# Patient Record
Sex: Male | Born: 1970
Health system: Southern US, Community
[De-identification: ages and names within clinical notes are randomized; demographics above are authoritative.]

## PROBLEM LIST (undated history)

## (undated) DIAGNOSIS — C210 Malignant neoplasm of anus, unspecified: Secondary | ICD-10-CM

## (undated) DIAGNOSIS — R911 Solitary pulmonary nodule: Secondary | ICD-10-CM

## (undated) DIAGNOSIS — K6289 Other specified diseases of anus and rectum: Secondary | ICD-10-CM

## (undated) DIAGNOSIS — Z9221 Personal history of antineoplastic chemotherapy: Secondary | ICD-10-CM

## (undated) DIAGNOSIS — B20 Human immunodeficiency virus [HIV] disease: Secondary | ICD-10-CM

## (undated) DIAGNOSIS — Z923 Personal history of irradiation: Secondary | ICD-10-CM

## (undated) DIAGNOSIS — A630 Anogenital (venereal) warts: Secondary | ICD-10-CM

## (undated) DIAGNOSIS — R768 Other specified abnormal immunological findings in serum: Secondary | ICD-10-CM

## (undated) HISTORY — DX: Human immunodeficiency virus (HIV) disease: B20

## (undated) HISTORY — PX: OTHER SURGICAL HISTORY: SHX169

## (undated) NOTE — *Deleted (*Deleted)
[facility] ([facility] Care Management  10/11/2019  Juan Harrison 12/19/1970 [id number]   Telephone Assessment-Successful-Preventive Measures  RN spoke with pt today and received an update ongoing his ongoing management of care. Pt reports he is doing well with no acute issues at this time. Pt verified sufficient refills on his medications and transportation to get to all scheduled appointments. Pt continue to report managing his care to avoid hospitalization and acute problems from occurring.  Will review and discuss pt's progress with the current plan of care in place and continue to encouraged adherence with prevention measures. Will continue to offer tools to assist with these goals and update the goals of care.   Plan: Will follow up next month with plans to re-evaluate pt's progress next month.     Lynnett Langlinais, RN Care Management Coordinator [facility] Main Office [phone]  

---

## 2014-12-07 DIAGNOSIS — R51 Headache: Secondary | ICD-10-CM | POA: Diagnosis not present

## 2014-12-07 DIAGNOSIS — Z720 Tobacco use: Secondary | ICD-10-CM | POA: Diagnosis not present

## 2014-12-07 DIAGNOSIS — B20 Human immunodeficiency virus [HIV] disease: Secondary | ICD-10-CM | POA: Diagnosis not present

## 2014-12-07 DIAGNOSIS — B451 Cerebral cryptococcosis: Secondary | ICD-10-CM | POA: Diagnosis not present

## 2014-12-07 DIAGNOSIS — G02 Meningitis in other infectious and parasitic diseases classified elsewhere: Secondary | ICD-10-CM | POA: Diagnosis not present

## 2015-02-15 DIAGNOSIS — B451 Cerebral cryptococcosis: Secondary | ICD-10-CM | POA: Diagnosis not present

## 2015-02-15 DIAGNOSIS — B20 Human immunodeficiency virus [HIV] disease: Secondary | ICD-10-CM | POA: Diagnosis not present

## 2015-02-15 DIAGNOSIS — Z720 Tobacco use: Secondary | ICD-10-CM | POA: Diagnosis not present

## 2015-02-15 DIAGNOSIS — Z21 Asymptomatic human immunodeficiency virus [HIV] infection status: Secondary | ICD-10-CM | POA: Diagnosis not present

## 2015-03-24 DIAGNOSIS — Z79899 Other long term (current) drug therapy: Secondary | ICD-10-CM | POA: Diagnosis not present

## 2015-03-24 DIAGNOSIS — R21 Rash and other nonspecific skin eruption: Secondary | ICD-10-CM | POA: Diagnosis not present

## 2015-03-24 DIAGNOSIS — F17210 Nicotine dependence, cigarettes, uncomplicated: Secondary | ICD-10-CM | POA: Diagnosis not present

## 2015-03-24 DIAGNOSIS — Z720 Tobacco use: Secondary | ICD-10-CM | POA: Diagnosis not present

## 2015-03-24 DIAGNOSIS — B451 Cerebral cryptococcosis: Secondary | ICD-10-CM | POA: Diagnosis not present

## 2015-03-24 DIAGNOSIS — Z9119 Patient's noncompliance with other medical treatment and regimen: Secondary | ICD-10-CM | POA: Diagnosis not present

## 2015-03-24 DIAGNOSIS — Z21 Asymptomatic human immunodeficiency virus [HIV] infection status: Secondary | ICD-10-CM | POA: Diagnosis not present

## 2015-03-24 DIAGNOSIS — B20 Human immunodeficiency virus [HIV] disease: Secondary | ICD-10-CM | POA: Diagnosis not present

## 2015-03-24 DIAGNOSIS — R768 Other specified abnormal immunological findings in serum: Secondary | ICD-10-CM | POA: Diagnosis not present

## 2015-04-26 DIAGNOSIS — B181 Chronic viral hepatitis B without delta-agent: Secondary | ICD-10-CM | POA: Diagnosis not present

## 2015-05-15 DIAGNOSIS — Z720 Tobacco use: Secondary | ICD-10-CM | POA: Diagnosis not present

## 2015-05-15 DIAGNOSIS — B20 Human immunodeficiency virus [HIV] disease: Secondary | ICD-10-CM | POA: Diagnosis not present

## 2015-05-15 DIAGNOSIS — R51 Headache: Secondary | ICD-10-CM | POA: Diagnosis not present

## 2015-05-15 DIAGNOSIS — B451 Cerebral cryptococcosis: Secondary | ICD-10-CM | POA: Diagnosis not present

## 2015-05-15 DIAGNOSIS — Z9114 Patient's other noncompliance with medication regimen: Secondary | ICD-10-CM | POA: Diagnosis not present

## 2015-05-19 DIAGNOSIS — R030 Elevated blood-pressure reading, without diagnosis of hypertension: Secondary | ICD-10-CM | POA: Diagnosis not present

## 2015-05-19 DIAGNOSIS — R0602 Shortness of breath: Secondary | ICD-10-CM | POA: Diagnosis not present

## 2015-05-19 DIAGNOSIS — Z21 Asymptomatic human immunodeficiency virus [HIV] infection status: Secondary | ICD-10-CM | POA: Diagnosis not present

## 2015-05-19 DIAGNOSIS — D72819 Decreased white blood cell count, unspecified: Secondary | ICD-10-CM | POA: Diagnosis not present

## 2015-05-19 DIAGNOSIS — B181 Chronic viral hepatitis B without delta-agent: Secondary | ICD-10-CM | POA: Diagnosis not present

## 2015-05-19 DIAGNOSIS — R918 Other nonspecific abnormal finding of lung field: Secondary | ICD-10-CM | POA: Diagnosis not present

## 2015-05-19 DIAGNOSIS — N179 Acute kidney failure, unspecified: Secondary | ICD-10-CM | POA: Diagnosis not present

## 2015-05-19 DIAGNOSIS — B20 Human immunodeficiency virus [HIV] disease: Secondary | ICD-10-CM | POA: Diagnosis not present

## 2015-05-19 DIAGNOSIS — E875 Hyperkalemia: Secondary | ICD-10-CM | POA: Diagnosis not present

## 2015-05-19 DIAGNOSIS — T367X5A Adverse effect of antifungal antibiotics, systemically used, initial encounter: Secondary | ICD-10-CM | POA: Diagnosis not present

## 2015-05-19 DIAGNOSIS — B451 Cerebral cryptococcosis: Secondary | ICD-10-CM | POA: Diagnosis not present

## 2015-05-19 DIAGNOSIS — Z452 Encounter for adjustment and management of vascular access device: Secondary | ICD-10-CM | POA: Diagnosis not present

## 2015-05-19 DIAGNOSIS — R51 Headache: Secondary | ICD-10-CM | POA: Diagnosis not present

## 2015-05-19 DIAGNOSIS — B1910 Unspecified viral hepatitis B without hepatic coma: Secondary | ICD-10-CM | POA: Diagnosis not present

## 2015-05-19 DIAGNOSIS — E876 Hypokalemia: Secondary | ICD-10-CM | POA: Diagnosis not present

## 2015-05-19 DIAGNOSIS — R109 Unspecified abdominal pain: Secondary | ICD-10-CM | POA: Diagnosis not present

## 2015-05-19 DIAGNOSIS — Z9114 Patient's other noncompliance with medication regimen: Secondary | ICD-10-CM | POA: Diagnosis not present

## 2015-05-19 DIAGNOSIS — R300 Dysuria: Secondary | ICD-10-CM | POA: Diagnosis not present

## 2015-05-19 DIAGNOSIS — F17210 Nicotine dependence, cigarettes, uncomplicated: Secondary | ICD-10-CM | POA: Diagnosis not present

## 2015-06-05 DIAGNOSIS — F17210 Nicotine dependence, cigarettes, uncomplicated: Secondary | ICD-10-CM | POA: Diagnosis not present

## 2015-06-05 DIAGNOSIS — Z792 Long term (current) use of antibiotics: Secondary | ICD-10-CM | POA: Diagnosis not present

## 2015-06-05 DIAGNOSIS — B1910 Unspecified viral hepatitis B without hepatic coma: Secondary | ICD-10-CM | POA: Diagnosis not present

## 2015-06-05 DIAGNOSIS — B20 Human immunodeficiency virus [HIV] disease: Secondary | ICD-10-CM | POA: Diagnosis not present

## 2015-06-05 DIAGNOSIS — Z9119 Patient's noncompliance with other medical treatment and regimen: Secondary | ICD-10-CM | POA: Diagnosis not present

## 2015-06-05 DIAGNOSIS — G510 Bell's palsy: Secondary | ICD-10-CM | POA: Diagnosis not present

## 2015-06-05 DIAGNOSIS — B451 Cerebral cryptococcosis: Secondary | ICD-10-CM | POA: Diagnosis not present

## 2015-06-06 DIAGNOSIS — Z9119 Patient's noncompliance with other medical treatment and regimen: Secondary | ICD-10-CM | POA: Diagnosis not present

## 2015-06-06 DIAGNOSIS — F17201 Nicotine dependence, unspecified, in remission: Secondary | ICD-10-CM | POA: Diagnosis not present

## 2015-06-06 DIAGNOSIS — B451 Cerebral cryptococcosis: Secondary | ICD-10-CM | POA: Diagnosis not present

## 2015-06-06 DIAGNOSIS — Z21 Asymptomatic human immunodeficiency virus [HIV] infection status: Secondary | ICD-10-CM | POA: Diagnosis not present

## 2015-06-06 DIAGNOSIS — F810 Specific reading disorder: Secondary | ICD-10-CM | POA: Diagnosis not present

## 2015-06-06 DIAGNOSIS — Z87891 Personal history of nicotine dependence: Secondary | ICD-10-CM | POA: Diagnosis not present

## 2015-06-06 DIAGNOSIS — B20 Human immunodeficiency virus [HIV] disease: Secondary | ICD-10-CM | POA: Diagnosis not present

## 2015-06-26 DIAGNOSIS — B1910 Unspecified viral hepatitis B without hepatic coma: Secondary | ICD-10-CM | POA: Diagnosis not present

## 2015-06-26 DIAGNOSIS — B20 Human immunodeficiency virus [HIV] disease: Secondary | ICD-10-CM | POA: Diagnosis not present

## 2015-06-26 DIAGNOSIS — Z9119 Patient's noncompliance with other medical treatment and regimen: Secondary | ICD-10-CM | POA: Diagnosis not present

## 2015-06-26 DIAGNOSIS — F17210 Nicotine dependence, cigarettes, uncomplicated: Secondary | ICD-10-CM | POA: Diagnosis not present

## 2015-06-26 DIAGNOSIS — Z21 Asymptomatic human immunodeficiency virus [HIV] infection status: Secondary | ICD-10-CM | POA: Diagnosis not present

## 2015-06-26 DIAGNOSIS — B451 Cerebral cryptococcosis: Secondary | ICD-10-CM | POA: Diagnosis not present

## 2015-06-26 DIAGNOSIS — R768 Other specified abnormal immunological findings in serum: Secondary | ICD-10-CM | POA: Diagnosis not present

## 2015-09-14 DIAGNOSIS — Z113 Encounter for screening for infections with a predominantly sexual mode of transmission: Secondary | ICD-10-CM | POA: Diagnosis not present

## 2015-09-14 DIAGNOSIS — B451 Cerebral cryptococcosis: Secondary | ICD-10-CM | POA: Diagnosis not present

## 2015-09-14 DIAGNOSIS — Z118 Encounter for screening for other infectious and parasitic diseases: Secondary | ICD-10-CM | POA: Diagnosis not present

## 2015-09-14 DIAGNOSIS — B20 Human immunodeficiency virus [HIV] disease: Secondary | ICD-10-CM | POA: Diagnosis not present

## 2015-09-14 DIAGNOSIS — Z87891 Personal history of nicotine dependence: Secondary | ICD-10-CM | POA: Diagnosis not present

## 2016-01-08 DIAGNOSIS — Z0000 Encounter for general adult medical examination without abnormal findings: Secondary | ICD-10-CM | POA: Diagnosis not present

## 2016-01-08 DIAGNOSIS — F17210 Nicotine dependence, cigarettes, uncomplicated: Secondary | ICD-10-CM | POA: Diagnosis not present

## 2016-01-08 DIAGNOSIS — Z9114 Patient's other noncompliance with medication regimen: Secondary | ICD-10-CM | POA: Diagnosis not present

## 2016-01-08 DIAGNOSIS — B20 Human immunodeficiency virus [HIV] disease: Secondary | ICD-10-CM | POA: Diagnosis not present

## 2016-01-08 DIAGNOSIS — Z23 Encounter for immunization: Secondary | ICD-10-CM | POA: Diagnosis not present

## 2016-02-29 ENCOUNTER — Other Ambulatory Visit (HOSPITAL_COMMUNITY)
Admission: RE | Admit: 2016-02-29 | Discharge: 2016-02-29 | Disposition: A | Discharge status: Home / Self Care | Payer: Medicare Other | Source: Ambulatory Visit | Attending: Internal Medicine | Admitting: Internal Medicine

## 2016-02-29 ENCOUNTER — Other Ambulatory Visit: Payer: Medicare Other

## 2016-02-29 DIAGNOSIS — B20 Human immunodeficiency virus [HIV] disease: Secondary | ICD-10-CM

## 2016-02-29 DIAGNOSIS — Z113 Encounter for screening for infections with a predominantly sexual mode of transmission: Secondary | ICD-10-CM | POA: Diagnosis not present

## 2016-02-29 DIAGNOSIS — Z79899 Other long term (current) drug therapy: Secondary | ICD-10-CM | POA: Diagnosis not present

## 2016-02-29 DIAGNOSIS — Z111 Encounter for screening for respiratory tuberculosis: Secondary | ICD-10-CM

## 2016-02-29 LAB — COMPLETE METABOLIC PANEL WITH GFR
ALT: 22 U/L (ref 9–46)
AST: 18 U/L (ref 10–40)
Albumin: 4 g/dL (ref 3.6–5.1)
Alkaline Phosphatase: 66 U/L (ref 40–115)
BUN: 12 mg/dL (ref 7–25)
CALCIUM: 9.2 mg/dL (ref 8.6–10.3)
CHLORIDE: 103 mmol/L (ref 98–110)
CO2: 28 mmol/L (ref 20–31)
CREATININE: 1.11 mg/dL (ref 0.60–1.35)
GFR, EST NON AFRICAN AMERICAN: 81 mL/min (ref >=60)
Glucose, Bld: 88 mg/dL (ref 65–99)
POTASSIUM: 4.1 mmol/L (ref 3.5–5.3)
Sodium: 139 mmol/L (ref 135–146)
Total Bilirubin: 0.4 mg/dL (ref 0.2–1.2)
Total Protein: 7.3 g/dL (ref 6.1–8.1)

## 2016-02-29 LAB — LIPID PANEL
Cholesterol: 117 mg/dL — ABNORMAL LOW (ref 125–200)
HDL: 39 mg/dL — AB (ref >=40)
LDL CALC: 64 mg/dL (ref <130)
TRIGLYCERIDES: 69 mg/dL (ref <150)
Total CHOL/HDL Ratio: 3 Ratio (ref <=5.0)
VLDL: 14 mg/dL (ref <30)

## 2016-02-29 LAB — CBC WITH DIFFERENTIAL/PLATELET
Basophils Absolute: 0.1 10*3/uL (ref 0.0–0.1)
Basophils Relative: 3 % — ABNORMAL HIGH (ref 0–1)
EOS ABS: 0.3 10*3/uL (ref 0.0–0.7)
EOS PCT: 11 % — AB (ref 0–5)
HEMATOCRIT: 42 % (ref 39.0–52.0)
Hemoglobin: 13.7 g/dL (ref 13.0–17.0)
LYMPHS ABS: 1 10*3/uL (ref 0.7–4.0)
LYMPHS PCT: 35 % (ref 12–46)
MCH: 27.7 pg (ref 26.0–34.0)
MCHC: 32.6 g/dL (ref 30.0–36.0)
MCV: 85 fL (ref 78.0–100.0)
MONO ABS: 0.3 10*3/uL (ref 0.1–1.0)
MPV: 11.1 fL (ref 8.6–12.4)
Monocytes Relative: 9 % (ref 3–12)
Neutro Abs: 1.2 10*3/uL — ABNORMAL LOW (ref 1.7–7.7)
Neutrophils Relative %: 42 % — ABNORMAL LOW (ref 43–77)
PLATELETS: 183 10*3/uL (ref 150–400)
RBC: 4.94 MIL/uL (ref 4.22–5.81)
RDW: 14.3 % (ref 11.5–15.5)
WBC: 2.8 10*3/uL — AB (ref 4.0–10.5)

## 2016-03-01 LAB — URINALYSIS
Bilirubin Urine: NEGATIVE
Glucose, UA: NEGATIVE
HGB URINE DIPSTICK: NEGATIVE
KETONES UR: NEGATIVE
Leukocytes, UA: NEGATIVE
NITRITE: NEGATIVE
Specific Gravity, Urine: 1.028 (ref 1.001–1.035)
pH: 6.5 (ref 5.0–8.0)

## 2016-03-01 LAB — RPR TITER: RPR Titer: 1:1 {titer}

## 2016-03-01 LAB — URINE CYTOLOGY ANCILLARY ONLY
Chlamydia: NEGATIVE
Neisseria Gonorrhea: NEGATIVE

## 2016-03-01 LAB — HEPATITIS B SURFACE ANTIGEN: HEP B S AG: POSITIVE — AB

## 2016-03-01 LAB — RPR: RPR: REACTIVE — AB

## 2016-03-01 LAB — HIV-1 RNA ULTRAQUANT REFLEX TO GENTYP+
HIV 1 RNA QUANT: 32353 {copies}/mL — AB (ref <20)
HIV-1 RNA QUANT, LOG: 4.51 {Log_copies}/mL — AB (ref <1.30)

## 2016-03-01 LAB — T-HELPER CELL (CD4) - (RCID CLINIC ONLY)
CD4 % Helper T Cell: 4 % — ABNORMAL LOW (ref 33–55)
CD4 T CELL ABS: 30 /uL — AB (ref 400–2700)

## 2016-03-01 LAB — HEPATITIS B SURFACE ANTIBODY,QUALITATIVE: HEP B S AB: NEGATIVE

## 2016-03-01 LAB — HEPATITIS C ANTIBODY: HCV AB: NEGATIVE

## 2016-03-01 LAB — HEPATITIS B SURF AG CONFIRMATION: HEPATITIS B SURFACE ANTIGEN CONFIRMATION: POSITIVE — AB

## 2016-03-01 LAB — FLUORESCENT TREPONEMAL AB(FTA)-IGG-BLD: Fluorescent Treponemal ABS: NONREACTIVE

## 2016-03-01 LAB — HEPATITIS A ANTIBODY, TOTAL: Hep A Total Ab: NONREACTIVE

## 2016-03-01 LAB — HEPATITIS B CORE ANTIBODY, TOTAL: HEP B C TOTAL AB: NONREACTIVE

## 2016-03-02 LAB — QUANTIFERON TB GOLD ASSAY (BLOOD)
INTERFERON GAMMA RELEASE ASSAY: NEGATIVE
Mitogen-Nil: 5.67 IU/mL
Quantiferon Nil Value: 0.04 IU/mL
Quantiferon Tb Ag Minus Nil Value: 0.03 IU/mL

## 2016-03-06 LAB — HLA B*5701: HLA-B 5701 W/RFLX HLA-B HIGH: NEGATIVE

## 2016-03-07 ENCOUNTER — Telehealth: Payer: Self-pay

## 2016-03-08 LAB — HIV-1 GENOTYPR PLUS

## 2016-03-26 ENCOUNTER — Ambulatory Visit: Payer: Medicare Other | Admitting: Internal Medicine

## 2016-05-20 ENCOUNTER — Ambulatory Visit (INDEPENDENT_AMBULATORY_CARE_PROVIDER_SITE_OTHER): Payer: Medicare Other | Admitting: Infectious Diseases

## 2016-05-20 ENCOUNTER — Encounter: Payer: Self-pay | Admitting: Infectious Diseases

## 2016-05-20 ENCOUNTER — Ambulatory Visit: Payer: Medicare Other | Admitting: *Deleted

## 2016-05-20 ENCOUNTER — Other Ambulatory Visit: Payer: Self-pay | Admitting: Pharmacist Clinician (PhC)/ Clinical Pharmacy Specialist

## 2016-05-20 VITALS — BP 105/67 | HR 86 | Temp 98.1°F | Wt 143.0 lb

## 2016-05-20 DIAGNOSIS — B20 Human immunodeficiency virus [HIV] disease: Secondary | ICD-10-CM

## 2016-05-20 DIAGNOSIS — F4323 Adjustment disorder with mixed anxiety and depressed mood: Secondary | ICD-10-CM

## 2016-05-20 MED ORDER — ELVITEG-COBIC-EMTRICIT-TENOFAF 150-150-200-10 MG PO TABS: 1.0000 | ORAL_TABLET | Freq: Every day | ORAL | Status: DC

## 2016-05-20 MED FILL — GENVOYA TABLET: 150-150-200 | 30 days supply | Qty: 30 | Fill #0

## 2016-05-20 NOTE — BH Specialist Note (Deleted)
Counselor met with Juan Harrison today in the exam room as patient is a new. Counselor made the necessary introductions and shared with patient about the counseling services. Patient was oriented times four with flat affect but appropiate dress. Patient was soft spoken and a bit standoffish at first. Patient indicated that he was doing ok but felt like he was just roaming through life with minimal support from family. Patient stated that he was more incline to come to the assistance and support of his family members than the other way around. Counselor reassured patient that he could receive much support and [facility]. Counselor provided encouragement and recommended that patient make an appointment with counselor to process what is going on in his life. Patient began to tear up and wiped his tears with office tissues while placing his face in his hands momentarily. Counselor provided support for patient.  Juan Straka, MA, LPC [facility]           

## 2016-05-20 NOTE — Progress Notes (Signed)
.  Juan Harrison

## 2016-05-20 NOTE — BH Specialist Note (Signed)
Counselor met with Juan Harrison today in the exam room as patient is a new. Counselor made the necessary introductions and shared with patient about the counseling services. Patient was oriented times four with flat affect but appropiate dress. Patient was soft spoken and a bit standoffish at first. Patient indicated that he was doing ok but felt like he was just roaming through life with minimal support from family. Patient stated that he was more incline to come to the assistance and support of his family members than the other way around. Counselor reassured patient that he could receive much support and [facility]. Counselor provided encouragement and recommended that patient make an appointment with counselor to process what is going on in his life. Patient began to tear up and wiped his tears with office tissues while placing his face in his hands momentarily. Counselor provided support for patient.  Rolena Infante, MA, LPC [facility]

## 2016-05-20 NOTE — Progress Notes (Signed)
   Subjective:    Patient ID: Juan Harrison, male    DOB: 1971/06/02, 45 y.o.   MRN: [id number]  HPI 45 yo M with hx of HIV/AIDS. Dx ~2007. He is unclear how he got infected. He had "alcohol poisoning" when he was dx.  Currently on DRVr/TRV/EPZ bactrim, diflucan Has not taken this AM.  Has been on multiple regimens. Rx made him sick previously.  Prev cared for at [facility]  Has been down due to death of his mom from "cancer", he is not able to define type. Would like a CT scan to f/u his risk ( I let him know that without more specifics this is not possible).   HIV 1 RNA QUANT (copies/mL)  Date Value  02/29/2016 32353*   CD4 T CELL ABS (/uL)  Date Value  02/29/2016 30*    Genosure 05-2015: Ziagen Sensitive RAMs*: None Didanosine Videx Sensitive RAMs*: None Emtricitabine Emtriva Sensitive RAMs*: None Lamivudine Epivir Sensitive RAMs*: None Stavudine Zerit Sensitive RAMs*: None Tenofovir Viread Sensitive RAMs*: None Zidovudine Retrovir Sensitive RAMs*: None NNRTI Efavirenz Sustiva Sensitive RAMs*: None Etravirine Intelence Sensitive RAMs*: None Nevirapine Viramune Sensitive RAMs*: None Rilpivirine Edurant Sensitive RAMs*: None INI Dolutegravir Tivicay Sensitive RAMs*: None Elvitegravir Vitekta Sensitive RAMs*: None Raltegravir Isentress Sensitive RAMs*: I203I/M PI Atazanavir Reyataz Sensitive RAMs*: M36M/V, D60E Atazanavir/r Reyataz / r Sensitive RAMs*: M36M/V, D60E Darunavir/r Prezista / r Sensitive RAMs*: None Fosamprenavir/r Lexiva / r Sensitive RAMs*: None Indinavir/r Crixivan / r Sensitive RAMs*: None Lopinavir Kaletra Sensitive RAMs*: None Nelfinavir Viracept Sensitive RAMs*: M36M/V Ritonavir Norvir Sensitive RAMs*: M36M/V Saquinavir/r Invirase / r Sensitive RAMs*: None Tipranavir/r Aptivus / r Sensitive   He attributes his prev GI upset to thrush, resolved with tx.   The past medical history, family history and social history were reviewed/updated in  EPIC  Review of Systems  Constitutional: Positive for unexpected weight change. Negative for appetite change.  HENT: Negative for mouth sores and sinus pressure.   Respiratory: Negative for cough and shortness of breath.   Gastrointestinal: Negative for diarrhea and constipation.  Genitourinary: Negative for difficulty urinating.  Neurological: Negative for headaches.       Objective:   Physical Exam  Constitutional: He appears well-developed and well-nourished.  HENT:  Mouth/Throat: No oropharyngeal exudate.  Eyes: EOM are normal. Pupils are equal, round, and reactive to light.  Neck: Neck supple.  Cardiovascular: Normal rate, regular rhythm and normal heart sounds.   Pulmonary/Chest: Effort normal and breath sounds normal.  Abdominal: Soft. Bowel sounds are normal. There is no tenderness. There is no rebound.  Musculoskeletal: He exhibits no edema.  Lymphadenopathy:    He has no cervical adenopathy.      Assessment & Plan:

## 2016-05-20 NOTE — Assessment & Plan Note (Signed)
Will change his ART to genvoya Will have ambre see him Will continue diflucan Given condoms Back in 2 months.

## 2016-05-20 NOTE — Progress Notes (Signed)
Patient ID: Juan Harrison, male   DOB: 10-Aug-1971, 45 y.o.   MRN: [id number] HPI: Juan Harrison is a 45 y.o. male who is here to establish his HIV care.   Allergies: Not on File  Vitals: Temp: 98.1 F (36.7 C) (06/19 0905) Temp Source: Oral (06/19 0905) BP: 105/67 mmHg (06/19 0905) Pulse Rate: 86 (06/19 0905)  Past Medical History: Past Medical History  Diagnosis Date  . AIDS [facility])     Social History: Social History   Social History  . Marital Status: Single    Spouse Name: N/A  . Number of Children: N/A  . Years of Education: N/A   Social History Main Topics  . Smoking status: Current Every Day Smoker -- 1.00 packs/day    Types: Cigarettes  . Smokeless tobacco: Not on file  . Alcohol Use: 0.6 oz/week    1 Standard drinks or equivalent per week  . Drug Use: Yes    Special: Marijuana  . Sexual Activity: Not on file   Other Topics Concern  . Not on file   Social History Narrative  . No narrative on file    Previous Regimen: ATP  Current Regimen: DRV/r/EPZ/TDF  Labs: HIV 1 RNA QUANT (copies/mL)  Date Value  02/29/2016 32353*   CD4 T CELL ABS (/uL)  Date Value  02/29/2016 30*   HEP B S AB (no units)  Date Value  02/29/2016 NEG   HEPATITIS B SURFACE AG (no units)  Date Value  02/29/2016 POSITIVE*   HCV AB (no units)  Date Value  02/29/2016 NEGATIVE    CrCl: CrCl cannot be calculated (Unknown ideal weight.).  Lipids:    Component Value Date/Time   CHOL 117* 02/29/2016 1120   TRIG 69 02/29/2016 1120   HDL 39* 02/29/2016 1120   CHOLHDL 3.0 02/29/2016 1120   VLDL 14 02/29/2016 1120   LDLCALC 64 02/29/2016 1120    Assessment:  Juan Harrison is here for his HIV after he moved here from [place]. He has a very low level of literacy. He stated that he has been taking meds consistently but his VL was >30k at the end of March and CD4 was 30. His genosure that was recently done didn't show much resistance. I called the [facility] and  they stated that he has had issue with non-compliance for a long time. Apparently, his VL has always been out of control. He was supposed to be on DRV/r/EPZ/TDF. He is also co-infected with hep B. From what they stated, he has been on TDF for a while. Maybe for hep B? We are going to send the Rx to [facility] to see if we can keep track of his refills.   Recommendations:  Start Genvoya 1 PO qday   Wilfred Lacy, PharmD Clinical Infectious Disease Pharmacist [facility] 05/20/2016, 10:26 AM

## 2016-05-24 ENCOUNTER — Telehealth: Payer: Self-pay | Admitting: *Deleted

## 2016-05-24 NOTE — Telephone Encounter (Signed)
RN received a referral from Dr [name] for [place]. RN contacted the patient and spoke with Mr. Bittenbender. RN explained the reason for my call and all the different areas that I may be able to assist the him with. Mr. Muska stated on the 19th he saw Dr [name] who placed him on 1 pill instead of the 4 that he was taking. Mr. Vigen stated his old regimen consisted of 4 pills and they continued to make him sick. Mr. Havins said his new one pill regimen is working good for him. Patient declined any thrush, pain or nausea and stated he plans to continue to take his one pill. RN asked the patient if he is also taking his bactrim twice a day? Mr Curro stated he is not and is just focusing on his one pill(Genvoya). RN instructed Mr Snuffer on the success of the medications when taken everyday. Mr. Matey declined a home visit but stated he would get in contact with me if he begins to feel depressed and needs to talk. RN pleaded with the patient to please call even if he just sends a text message

## 2016-06-20 MED FILL — GENVOYA TABLET: 150-150-200 | 30 days supply | Qty: 30 | Fill #1

## 2016-07-02 ENCOUNTER — Encounter: Payer: Self-pay | Admitting: *Deleted

## 2016-07-22 ENCOUNTER — Encounter: Payer: Self-pay | Admitting: Infectious Diseases

## 2016-07-22 ENCOUNTER — Ambulatory Visit (INDEPENDENT_AMBULATORY_CARE_PROVIDER_SITE_OTHER): Payer: Medicare Other | Admitting: Infectious Diseases

## 2016-07-22 VITALS — BP 109/68 | HR 67 | Temp 98.6°F | Wt 148.0 lb

## 2016-07-22 DIAGNOSIS — Z113 Encounter for screening for infections with a predominantly sexual mode of transmission: Secondary | ICD-10-CM | POA: Diagnosis not present

## 2016-07-22 DIAGNOSIS — Z79899 Other long term (current) drug therapy: Secondary | ICD-10-CM | POA: Diagnosis not present

## 2016-07-22 DIAGNOSIS — B20 Human immunodeficiency virus [HIV] disease: Secondary | ICD-10-CM

## 2016-07-22 NOTE — Assessment & Plan Note (Signed)
He appears to be doing well Will recheck his CD4 and VL given condoms Refuses flu shot.  Will get him enrolled with [facility] Will see him back in 3-4 months

## 2016-07-22 NOTE — Addendum Note (Signed)
Addended by: Dolan Amen D on: 07/22/2016 10:48 AM   Modules accepted: Orders

## 2016-07-22 NOTE — Progress Notes (Signed)
   Subjective:    Patient ID: Juan Harrison, male    DOB: [birth year], 45 y.o.   MRN: [id number]  HPI 45 yo M with hx of HIV/AIDS. Dx ~2007. He is unclear how he got infected, was prev cared for in [place]. Was on DRVr/TRV/EPZ bactrim, at transfer. Hx of non-compliance.  He was changed to genvoya 05-2016.   HIV 1 RNA Quant (copies/mL)  Date Value  02/29/2016 32,353 (H)   CD4 T Cell Abs (/uL)  Date Value  02/29/2016 30 (L)   Has been doing better with genvoya. Has been feeling well.    Review of Systems  Constitutional: Negative for appetite change and unexpected weight change.  Gastrointestinal: Negative for constipation and diarrhea.  Genitourinary: Negative for difficulty urinating.  wt up 5#.      Objective:   Physical Exam  Constitutional: He appears well-developed and well-nourished.  HENT:  Mouth/Throat: No oropharyngeal exudate.  Eyes: EOM are normal. Pupils are equal, round, and reactive to light.  Neck: Neck supple.  Cardiovascular: Normal rate, regular rhythm and normal heart sounds.   Pulmonary/Chest: Effort normal and breath sounds normal.  Abdominal: Soft. Bowel sounds are normal. There is no tenderness. There is no rebound.  Musculoskeletal: He exhibits no edema.  Lymphadenopathy:    He has no cervical adenopathy.      Assessment & Plan:

## 2016-07-23 LAB — T-HELPER CELL (CD4) - (RCID CLINIC ONLY)
CD4 % Helper T Cell: 5 % — ABNORMAL LOW (ref 33–55)
CD4 T Cell Abs: 60 /uL — ABNORMAL LOW (ref 400–2700)

## 2016-07-23 LAB — HIV-1 RNA QUANT-NO REFLEX-BLD: HIV 1 RNA Quant: <20 copies/mL (ref <20)

## 2016-08-27 MED FILL — GENVOYA TABLET: 150-150-200 | 30 days supply | Qty: 30 | Fill #2

## 2016-09-25 MED FILL — GENVOYA TABLET: 150-150-200 | 30 days supply | Qty: 30 | Fill #3

## 2016-10-09 DIAGNOSIS — A630 Anogenital (venereal) warts: Secondary | ICD-10-CM | POA: Diagnosis not present

## 2016-10-09 DIAGNOSIS — F17210 Nicotine dependence, cigarettes, uncomplicated: Secondary | ICD-10-CM | POA: Diagnosis not present

## 2016-10-22 MED FILL — GENVOYA TABLET: 150-150-200 | 30 days supply | Qty: 30 | Fill #4

## 2016-10-23 ENCOUNTER — Ambulatory Visit: Payer: Medicare Other | Admitting: Infectious Diseases

## 2016-10-29 ENCOUNTER — Encounter: Payer: Self-pay | Admitting: Infectious Diseases

## 2016-10-29 ENCOUNTER — Ambulatory Visit (INDEPENDENT_AMBULATORY_CARE_PROVIDER_SITE_OTHER): Payer: Medicare Other | Admitting: Infectious Diseases

## 2016-10-29 VITALS — BP 119/76 | HR 81 | Temp 97.7°F | Ht 75.0 in | Wt 152.0 lb

## 2016-10-29 DIAGNOSIS — Z113 Encounter for screening for infections with a predominantly sexual mode of transmission: Secondary | ICD-10-CM | POA: Diagnosis not present

## 2016-10-29 DIAGNOSIS — A630 Anogenital (venereal) warts: Secondary | ICD-10-CM | POA: Insufficient documentation

## 2016-10-29 DIAGNOSIS — Z789 Other specified health status: Secondary | ICD-10-CM | POA: Insufficient documentation

## 2016-10-29 DIAGNOSIS — Z79899 Other long term (current) drug therapy: Secondary | ICD-10-CM | POA: Diagnosis not present

## 2016-10-29 DIAGNOSIS — Z23 Encounter for immunization: Secondary | ICD-10-CM

## 2016-10-29 DIAGNOSIS — B20 Human immunodeficiency virus [HIV] disease: Secondary | ICD-10-CM | POA: Diagnosis not present

## 2016-10-29 LAB — CBC
HEMATOCRIT: 41.2 % (ref 38.5–50.0)
HEMOGLOBIN: 13.7 g/dL (ref 13.2–17.1)
MCH: 29.2 pg (ref 27.0–33.0)
MCHC: 33.3 g/dL (ref 32.0–36.0)
MCV: 87.8 fL (ref 80.0–100.0)
MPV: 10.2 fL (ref 7.5–12.5)
Platelets: 219 10*3/uL (ref 140–400)
RBC: 4.69 MIL/uL (ref 4.20–5.80)
RDW: 14.1 % (ref 11.0–15.0)
WBC: 5.2 10*3/uL (ref 3.8–10.8)

## 2016-10-29 LAB — COMPREHENSIVE METABOLIC PANEL
ALBUMIN: 4 g/dL (ref 3.6–5.1)
ALK PHOS: 66 U/L (ref 40–115)
ALT: 25 U/L (ref 9–46)
AST: 20 U/L (ref 10–40)
BILIRUBIN TOTAL: 0.5 mg/dL (ref 0.2–1.2)
BUN: 14 mg/dL (ref 7–25)
CALCIUM: 9.3 mg/dL (ref 8.6–10.3)
CO2: 27 mmol/L (ref 20–31)
Chloride: 102 mmol/L (ref 98–110)
Creat: 1.13 mg/dL (ref 0.60–1.35)
Glucose, Bld: 96 mg/dL (ref 65–99)
POTASSIUM: 4 mmol/L (ref 3.5–5.3)
Sodium: 138 mmol/L (ref 135–146)
TOTAL PROTEIN: 7.3 g/dL (ref 6.1–8.1)

## 2016-10-29 MED ORDER — MENINGOCOCCAL A C Y&W-135 OLIG IM SOLR
0.5000 mL | Freq: Once | INTRAMUSCULAR | Status: AC
  Administered 2016-10-29: 0.5 mL via INTRAMUSCULAR

## 2016-10-29 MED ORDER — VALACYCLOVIR HCL 1 G PO TABS
1000.0000 mg | ORAL_TABLET | Freq: Two times a day (BID) | ORAL | 0 refills | Status: DC
Start: 2016-10-29 — End: 2017-09-04

## 2016-10-29 MED FILL — valACYclovir HCL 1 GM TABS: 1 | 10 days supply | Qty: 20 | Fill #0

## 2016-10-29 NOTE — Progress Notes (Signed)
   Subjective:    Patient ID: Juan Harrison, male    DOB: 1970/12/17, 45 y.o.   MRN: [id number]  HPI 45 yo M with hx of HIV/AIDS. Dx ~2007. He is unclear how he got infected, was prev cared for in [place]. Was on DRVr/TRV/EPZ bactrim, at transfer. Hx of non-compliance.  He was changed to genvoya 05-2016.  He was seen in ED [place] 11-8 with itching rash on bottom. He was told he had a STD but was given no rx. Has painful defecation since.  Normal urination, no d/c, no burning. No penile sores.   HIV 1 RNA Quant (copies/mL)  Date Value  07/22/2016 <20  02/29/2016 32,353 (H)   CD4 T Cell Abs (/uL)  Date Value  07/22/2016 60 (L)  02/29/2016 30 (L)   States medicine has been going well.   Review of Systems  Constitutional: Negative for appetite change, chills, fever and unexpected weight change.  Respiratory: Negative for cough and shortness of breath.   Gastrointestinal: Positive for constipation. Negative for diarrhea.  Genitourinary: Negative for difficulty urinating, discharge, dysuria and genital sores.   Denies new sex partners.     Objective:   Physical Exam  Constitutional: He appears well-developed and well-nourished.  HENT:  Mouth/Throat: No oropharyngeal exudate.  Eyes: EOM are normal. Pupils are equal, round, and reactive to light.  Neck: Neck supple.  Cardiovascular: Normal rate, regular rhythm and normal heart sounds.   Pulmonary/Chest: Effort normal and breath sounds normal.  Abdominal: Soft. Bowel sounds are normal. There is no tenderness. There is no rebound.  Genitourinary:     Musculoskeletal: He exhibits no edema.  Lymphadenopathy:    He has no cervical adenopathy.      Assessment & Plan:

## 2016-10-29 NOTE — Assessment & Plan Note (Signed)
Mening and flu shots today.  Check labs today.  Offered/refused condoms.  Will see him back in 3-4 months.

## 2016-10-29 NOTE — Assessment & Plan Note (Signed)
Not clear what is causing his pruritis.  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

## 2016-10-29 NOTE — Addendum Note (Signed)
Addended by: Myrtis Hopping A on: 10/29/2016 03:36 PM   Modules accepted: Orders

## 2016-10-30 LAB — T-HELPER CELL (CD4) - (RCID CLINIC ONLY)
CD4 T CELL ABS: 110 /uL — AB (ref 400–2700)
CD4 T CELL HELPER: 6 % — AB (ref 33–55)

## 2016-10-30 LAB — RPR

## 2016-10-31 LAB — HIV-1 RNA QUANT-NO REFLEX-BLD
HIV 1 RNA Quant: <20 copies/mL (ref <20)
HIV-1 RNA Quant, Log: <1.3 Log copies/mL (ref <1.30)

## 2016-11-05 ENCOUNTER — Telehealth: Payer: Self-pay | Admitting: *Deleted

## 2016-11-05 NOTE — Progress Notes (Signed)
No additional notes

## 2016-11-05 NOTE — Telephone Encounter (Signed)
Patient called c/o painful defecation and also some "tingling" when he urinates. He stated he is still able to have bowel movements, but with difficulty. Asked if he is eating enough fruits and vegetables and drinking a lot of water. He said he is drinking water but not eating a whole lot of fiber. Spoke to [place], Teaching laboratory technician and she is going to try and get him in with [facility] as soon as possible. She will reach out to patient tomorrow. Also advised patient if he has increased pain and is unable to have a bowel movement he may need to be evaluated by the ED. He agreed to this plan.

## 2016-11-06 ENCOUNTER — Telehealth: Payer: Self-pay | Admitting: Lab

## 2016-11-06 NOTE — Telephone Encounter (Signed)
Thanks Diane!

## 2016-11-06 NOTE — Telephone Encounter (Signed)
The soonest appointment available with [facility] is 12/14 @ 1110, but he needs to arrive 30 minutes early to register.  He needs to bring his insurance card and pictured ID.  The address is [address], [address].  I had to leave a message for the patient and I will give him all of this information when he returns my call.  The appointment is with Dr. Marcello Moores.

## 2016-11-06 NOTE — Telephone Encounter (Signed)
Patient returned my call.  He is aware of appointment information. I gave him the phone number [phone]

## 2016-11-14 DIAGNOSIS — K6289 Other specified diseases of anus and rectum: Secondary | ICD-10-CM | POA: Diagnosis not present

## 2016-11-19 MED FILL — GENVOYA TABLET: 150-150-200 | 30 days supply | Qty: 30 | Fill #5

## 2016-11-26 ENCOUNTER — Other Ambulatory Visit: Payer: Self-pay | Admitting: General Surgery

## 2016-11-26 ENCOUNTER — Encounter (HOSPITAL_BASED_OUTPATIENT_CLINIC_OR_DEPARTMENT_OTHER): Payer: Self-pay | Admitting: *Deleted

## 2016-11-26 NOTE — H&P (Signed)
History of Present Illness Juan Ruff MD; [id number] 11:08 AM) The patient is a 45 year old male who presents with anal pain. 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 The patient has AIDS with [place] CD4 count at 110. HIV viral loads or undetectable. He complains of severe anal pain. He denies any fevers. He has occasional bleeding. He denies any constipation. He appears to be non-compliant with his medications.   Past Surgical History Nance Pear, [place]; 11/14/2016 10:52 AM) Foot Surgery  Right. Oral Surgery   Allergies Bary Castilla [place], [place]; 11/14/2016 10:53 AM) No Known Drug Allergies 11/14/2016  Medication History Nance Pear, [place]; 11/14/2016 10:53 AM) [name] (150-150-200-10MG  Tablet, Oral two times daily) Active. ValACYclovir HCl (1GM Tablet, Oral daily) Active. Medications Reconciled  Social History Nance Pear, [place]; 11/14/2016 10:52 AM) Alcohol use  Recently quit alcohol use. Caffeine use  Coffee. Illicit drug use  Uses socially only. Tobacco use  Current every day smoker.  Family History Nance Pear, [place]; 11/14/2016 10:52 AM) Cancer  Family Members In General. Respiratory Condition  Brother.  Other Problems Nance Pear, [place]; 11/14/2016 10:52 AM) Anxiety Disorder  Bladder Problems  Hemorrhoids  HIV-positive     Review of Systems ([place]; 11/14/2016 10:52 AM) General Present- Fatigue. Not Present- Appetite Loss, Chills, Fever, Night Sweats, Weight Gain and Weight Loss. Skin Present- Change in Wart/Mole. Not Present- Dryness, Hives, Jaundice, New Lesions, Non-Healing Wounds, Rash and Ulcer. HEENT Not Present- Earache, Hearing Loss, Hoarseness, Nose Bleed, Oral Ulcers, Ringing in the Ears, Seasonal Allergies, Sinus Pain, Sore Throat, Visual Disturbances, Wears glasses/contact lenses and Yellow Eyes. Respiratory Not Present- Bloody sputum, Chronic Cough, Difficulty Breathing,  Snoring and Wheezing. Cardiovascular Not Present- Chest Pain, Difficulty Breathing Lying Down, Leg Cramps, Palpitations, Rapid Heart Rate, Shortness of Breath and Swelling of Extremities. Gastrointestinal Present- Bloody Stool, Change in Bowel Habits, Chronic diarrhea, Constipation, Excessive gas, Hemorrhoids and Rectal Pain. Not Present- Abdominal Pain, Bloating, Difficulty Swallowing, Gets full quickly at meals, Indigestion, Nausea and Vomiting. Male Genitourinary Present- Painful Urination. Not Present- Blood in Urine, Change in Urinary Stream, Frequency, Impotence, Nocturia, Urgency and Urine Leakage. Neurological Present- Tingling and Trouble walking. Not Present- Decreased Memory, Fainting, Headaches, Numbness, Seizures, Tremor and Weakness. Psychiatric Present- Anxiety and Change in Sleep Pattern. Not Present- Bipolar, Depression, Fearful and Frequent crying. Hematology Present- HIV. Not Present- Blood Thinners, Easy Bruising, Excessive bleeding, Gland problems and Persistent Infections.  Vitals Bary Castilla Bradford CMA; 11/14/2016 10:54 AM) 11/14/2016 10:53 AM Weight: 148.4 lb Height: 75in Body Surface Area: 1.93 m Body Mass Index: 18.55 kg/m  Pulse: 110 (Regular)  BP: 122/78 (Sitting, Left Arm, Standard)       Physical Exam Juan Ruff MD; [id number] 11:11 AM) General Mental Status-Alert. General Appearance-Not in acute distress. Build & Nutrition-Well nourished. Posture-Normal posture. Gait-Normal.  Head and Neck Head-normocephalic, atraumatic with no lesions or palpable masses. Trachea-midline.  Chest and Lung Exam Chest and lung exam reveals -on auscultation, normal breath sounds, no adventitious sounds and normal vocal resonance.  Cardiovascular Cardiovascular examination reveals -normal heart sounds, regular rate and rhythm with no murmurs and no digital clubbing, cyanosis, edema, increased warmth or  tenderness.  Abdomen Inspection Inspection of the abdomen reveals - No Hernias. Palpation/Percussion Palpation and Percussion of the abdomen reveal - Soft, Non Tender, No Rigidity (guarding), No hepatosplenomegaly and No Palpable abdominal masses.  Rectal Anorectal Exam External - Note: Mild anal condyloma noted with perianal discharge. Internal - Note: Anal  nodule at posterior midline. Significant tenderness to palpation circumferentially. No other obvious masses noted.  Neurologic Neurologic evaluation reveals -alert and oriented x 3 with no impairment of recent or remote memory, normal attention span and ability to concentrate, normal sensation and normal coordination.  Musculoskeletal Normal Exam - Bilateral-Upper Extremity Strength Normal and Lower Extremity Strength Normal.    Assessment & Plan Juan Ruff MD; [id number] 11:10 AM) ANAL OR RECTAL PAIN (K62.89) Impression: Patient having severe pain in and around his anal canal. He has some mild condyloma externally but no obvious source noted. No fissure noted. He does have a large skin tag and posterior midline. Given that he is unable to complete an exam here in the office, I have recommended an exam under anesthesia with treatment as needed.

## 2016-11-27 ENCOUNTER — Encounter (HOSPITAL_BASED_OUTPATIENT_CLINIC_OR_DEPARTMENT_OTHER): Payer: Self-pay | Admitting: *Deleted

## 2016-11-27 NOTE — Progress Notes (Signed)
NPO AFTER MN.  ARRIVE AT 0830.  NEEDS HG. 

## 2016-11-29 ENCOUNTER — Ambulatory Visit (HOSPITAL_BASED_OUTPATIENT_CLINIC_OR_DEPARTMENT_OTHER): Payer: Medicare Other | Admitting: Anesthesiology

## 2016-11-29 ENCOUNTER — Ambulatory Visit (HOSPITAL_BASED_OUTPATIENT_CLINIC_OR_DEPARTMENT_OTHER)
Admission: RE | Admit: 2016-11-29 | Discharge: 2016-11-29 | Disposition: A | Discharge status: Home / Self Care | Payer: Medicare Other | Source: Ambulatory Visit | Attending: General Surgery | Admitting: General Surgery

## 2016-11-29 ENCOUNTER — Encounter (HOSPITAL_BASED_OUTPATIENT_CLINIC_OR_DEPARTMENT_OTHER): Payer: Self-pay

## 2016-11-29 ENCOUNTER — Encounter (HOSPITAL_BASED_OUTPATIENT_CLINIC_OR_DEPARTMENT_OTHER)
Admission: RE | Disposition: A | Discharge status: Home / Self Care | Payer: Self-pay | Source: Ambulatory Visit | Attending: General Surgery

## 2016-11-29 DIAGNOSIS — Z21 Asymptomatic human immunodeficiency virus [HIV] infection status: Secondary | ICD-10-CM | POA: Diagnosis not present

## 2016-11-29 DIAGNOSIS — K6289 Other specified diseases of anus and rectum: Secondary | ICD-10-CM | POA: Diagnosis not present

## 2016-11-29 DIAGNOSIS — A630 Anogenital (venereal) warts: Secondary | ICD-10-CM | POA: Insufficient documentation

## 2016-11-29 DIAGNOSIS — F17200 Nicotine dependence, unspecified, uncomplicated: Secondary | ICD-10-CM | POA: Insufficient documentation

## 2016-11-29 DIAGNOSIS — B20 Human immunodeficiency virus [HIV] disease: Secondary | ICD-10-CM | POA: Diagnosis not present

## 2016-11-29 DIAGNOSIS — C211 Malignant neoplasm of anal canal: Secondary | ICD-10-CM | POA: Insufficient documentation

## 2016-11-29 DIAGNOSIS — B1910 Unspecified viral hepatitis B without hepatic coma: Secondary | ICD-10-CM | POA: Diagnosis not present

## 2016-11-29 DIAGNOSIS — D013 Carcinoma in situ of anus and anal canal: Secondary | ICD-10-CM | POA: Diagnosis not present

## 2016-11-29 HISTORY — PX: RECTAL BIOPSY: SHX2303

## 2016-11-29 HISTORY — PX: RECTAL EXAM UNDER ANESTHESIA: SHX6399

## 2016-11-29 HISTORY — DX: Other specified abnormal immunological findings in serum: R76.8

## 2016-11-29 LAB — POCT HEMOGLOBIN-HEMACUE: Hemoglobin: 14.5 g/dL (ref 13.0–17.0)

## 2016-11-29 SURGERY — EXAM UNDER ANESTHESIA, RECTUM
Anesthesia: Monitor Anesthesia Care | Site: Rectum

## 2016-11-29 MED ORDER — PROPOFOL 500 MG/50ML IV EMUL
INTRAVENOUS | Status: DC | PRN
  Administered 2016-11-29: 100 ug/kg/min via INTRAVENOUS

## 2016-11-29 MED ORDER — MIDAZOLAM HCL 2 MG/2ML IJ SOLN
INTRAMUSCULAR | Status: AC
  Filled 2016-11-29: qty 2

## 2016-11-29 MED ORDER — BUPIVACAINE-EPINEPHRINE 0.5% -1:200000 IJ SOLN
INTRAMUSCULAR | Status: DC | PRN
  Administered 2016-11-29: 30 mL

## 2016-11-29 MED ORDER — PROPOFOL 10 MG/ML IV BOLUS
INTRAVENOUS | Status: DC | PRN
  Administered 2016-11-29: 20 mg via INTRAVENOUS

## 2016-11-29 MED ORDER — PROMETHAZINE HCL 25 MG/ML IJ SOLN
6.2500 mg | INTRAMUSCULAR | Status: DC | PRN
  Filled 2016-11-29: qty 1

## 2016-11-29 MED ORDER — FENTANYL CITRATE (PF) 100 MCG/2ML IJ SOLN
25.0000 ug | INTRAMUSCULAR | Status: DC | PRN
  Filled 2016-11-29: qty 1

## 2016-11-29 MED ORDER — FENTANYL CITRATE (PF) 100 MCG/2ML IJ SOLN
INTRAMUSCULAR | Status: DC | PRN
  Administered 2016-11-29: 25 ug via INTRAVENOUS

## 2016-11-29 MED ORDER — LIDOCAINE 5 % EX OINT
TOPICAL_OINTMENT | CUTANEOUS | Status: DC | PRN
  Administered 2016-11-29: 1

## 2016-11-29 MED ORDER — KETOROLAC TROMETHAMINE 30 MG/ML IJ SOLN
30.0000 mg | Freq: Once | INTRAMUSCULAR | Status: AC | PRN
  Administered 2016-11-29: 30 mg via INTRAVENOUS
  Filled 2016-11-29: qty 1

## 2016-11-29 MED ORDER — FENTANYL CITRATE (PF) 100 MCG/2ML IJ SOLN
INTRAMUSCULAR | Status: AC
  Filled 2016-11-29: qty 2

## 2016-11-29 MED ORDER — LIDOCAINE 2% (20 MG/ML) 5 ML SYRINGE
INTRAMUSCULAR | Status: AC
  Filled 2016-11-29: qty 5

## 2016-11-29 MED ORDER — OXYCODONE HCL 5 MG PO TABS
5.0000 mg | ORAL_TABLET | Freq: Once | ORAL | Status: DC | PRN
  Filled 2016-11-29: qty 1

## 2016-11-29 MED ORDER — ONDANSETRON HCL 4 MG/2ML IJ SOLN
INTRAMUSCULAR | Status: DC | PRN
  Administered 2016-11-29: 4 mg via INTRAVENOUS

## 2016-11-29 MED ORDER — OXYCODONE HCL 5 MG/5ML PO SOLN
5.0000 mg | Freq: Once | ORAL | Status: DC | PRN
  Filled 2016-11-29: qty 5

## 2016-11-29 MED ORDER — LACTATED RINGERS IV SOLN
INTRAVENOUS | Status: DC
  Administered 2016-11-29: 08:00:00 via INTRAVENOUS
  Filled 2016-11-29: qty 1000

## 2016-11-29 MED ORDER — ONDANSETRON HCL 4 MG/2ML IJ SOLN
INTRAMUSCULAR | Status: AC
  Filled 2016-11-29: qty 2

## 2016-11-29 MED ORDER — OXYCODONE HCL 5 MG PO TABS: 5.0000 mg | ORAL_TABLET | Freq: Four times a day (QID) | ORAL | 0 refills | Status: DC | PRN

## 2016-11-29 MED ORDER — PROPOFOL 500 MG/50ML IV EMUL
INTRAVENOUS | Status: AC
  Filled 2016-11-29: qty 50

## 2016-11-29 MED ORDER — LIDOCAINE HCL (CARDIAC) 20 MG/ML IV SOLN
INTRAVENOUS | Status: DC | PRN
  Administered 2016-11-29: 50 mg via INTRAVENOUS

## 2016-11-29 MED ORDER — MIDAZOLAM HCL 5 MG/5ML IJ SOLN
INTRAMUSCULAR | Status: DC | PRN
  Administered 2016-11-29: 2 mg via INTRAVENOUS

## 2016-11-29 MED ORDER — KETOROLAC TROMETHAMINE 30 MG/ML IJ SOLN
INTRAMUSCULAR | Status: AC
  Filled 2016-11-29: qty 1

## 2016-11-29 MED ORDER — BUPIVACAINE LIPOSOME 1.3 % IJ SUSP
INTRAMUSCULAR | Status: DC | PRN
  Administered 2016-11-29: 20 mL

## 2016-11-29 MED FILL — oxyCODONE HCL 5 MG TABS: 5 | 8 days supply | Qty: 30 | Fill #0

## 2016-11-29 SURGICAL SUPPLY — 31 items
BENZOIN TINCTURE PRP APPL 2/3 (GAUZE/BANDAGES/DRESSINGS) ×2 IMPLANT
BLADE HEX COATED 2.75 (ELECTRODE) ×2 IMPLANT
BLADE SURG 15 STRL LF DISP TIS (BLADE) ×1 IMPLANT
BLADE SURG 15 STRL SS (BLADE) ×1
BRIEF STRETCH FOR OB PAD LRG (UNDERPADS AND DIAPERS) ×4 IMPLANT
CANISTER SUCTION 2500CC (MISCELLANEOUS) ×2 IMPLANT
COVER BACK TABLE 60X90IN (DRAPES) ×2 IMPLANT
COVER MAYO STAND STRL (DRAPES) ×2 IMPLANT
DRAPE LAPAROTOMY 100X72 PEDS (DRAPES) ×2 IMPLANT
DRAPE UTILITY XL STRL (DRAPES) ×2 IMPLANT
DRSG PAD ABDOMINAL 8X10 ST (GAUZE/BANDAGES/DRESSINGS) ×2 IMPLANT
ELECT REM PT RETURN 9FT ADLT (ELECTROSURGICAL) ×2
ELECTRODE REM PT RTRN 9FT ADLT (ELECTROSURGICAL) ×1 IMPLANT
GAUZE SPONGE 4X4 16PLY XRAY LF (GAUZE/BANDAGES/DRESSINGS) ×2 IMPLANT
GLOVE BIO SURGEON STRL SZ 6.5 (GLOVE) ×4 IMPLANT
GLOVE INDICATOR 7.0 STRL GRN (GLOVE) ×4 IMPLANT
GOWN SPEC L3 XXLG W/TWL (GOWN DISPOSABLE) ×2 IMPLANT
GOWN STRL REUS W/ TWL LRG LVL3 (GOWN DISPOSABLE) ×2 IMPLANT
GOWN STRL REUS W/TWL LRG LVL3 (GOWN DISPOSABLE) ×2
KIT ROOM TURNOVER WOR (KITS) ×2 IMPLANT
NEEDLE HYPO 22GX1.5 SAFETY (NEEDLE) ×2 IMPLANT
NS IRRIG 500ML POUR BTL (IV SOLUTION) ×2 IMPLANT
PACK BASIN DAY SURGERY FS (CUSTOM PROCEDURE TRAY) ×2 IMPLANT
PENCIL BUTTON HOLSTER BLD 10FT (ELECTRODE) ×2 IMPLANT
SPONGE GAUZE 4X4 12PLY STER LF (GAUZE/BANDAGES/DRESSINGS) ×2 IMPLANT
SUT CHROMIC 2 0 SH (SUTURE) ×2 IMPLANT
SYR CONTROL 10ML LL (SYRINGE) ×2 IMPLANT
TOWEL OR 17X24 6PK STRL BLUE (TOWEL DISPOSABLE) ×2 IMPLANT
TRAY DSU PREP LF (CUSTOM PROCEDURE TRAY) ×2 IMPLANT
TUBE CONNECTING 12X1/4 (SUCTIONS) ×2 IMPLANT
YANKAUER SUCT BULB TIP NO VENT (SUCTIONS) ×2 IMPLANT

## 2016-11-29 NOTE — Op Note (Signed)
11/29/2016  9:51 AM  PATIENT:  Juan Harrison  45 y.o. male  Patient Care Team: No Pcp Per Patient as PCP - General (General Practice)  PRE-OPERATIVE DIAGNOSIS:  ANAL PAIN  POST-OPERATIVE DIAGNOSIS:  ANAL MASS  PROCEDURE:  EXAM UNDER ANESTHESIA BIOPSY ANAL CANAL MASS  SURGEON:  Surgeon(s): Leighton Ruff, MD  ASSISTANT: none   ANESTHESIA:   local and MAC  SPECIMEN:  Source of Specimen:  L posteriolateral invasive appearing anal mass  DISPOSITION OF SPECIMEN:  PATHOLOGY  COUNTS:  YES  PLAN OF CARE: Discharge to home after PACU  PATIENT DISPOSITION:  PACU - hemodynamically stable.  INDICATION: 45 y.o. M with HIV/AIDS who presented to the office with severe anal pain.  I was unable to do an anal exam due to pain and therefore an anal exam under anesthesia was performed.     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  MAC anesthesia was induced without difficulty. The patient was then positioned in prone jackknife position with buttocks gently taped apart.  The patient was then prepped and draped in usual sterile fashion.  SCDs were noted to be in place prior to the initiation of anesthesia. A surgical timeout was performed indicating the correct patient, procedure, positioning and need for preoperative antibiotics.  A rectal block was performed using Marcaine with epinephrine mixed with Exparel.    I began with a digital rectal exam.  There was an obvious anal mass noted in the left posterior lateral anal canal.  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   I then placed a Hill-Ferguson anoscope into the anal canal and evaluated this completely.  The mass was noted with an area of central necrosis invading into the sphincter complex.  The edges of the  ulcer were trimmed away using scissors.  This was sent to pathology for further examination.  The area was closed loosely with a 2-0 Chromic suture for hemostasis.   The patient was awakened from anesthesia and sent to the postanesthesia care unit in stable condition. All counts were correct per operating room staff.

## 2016-11-29 NOTE — Discharge Instructions (Addendum)
Post Anesthesia Home Care Instructions  Activity: Get plenty of rest for the remainder of the day. A responsible adult should stay with you for 24 hours following the procedure.  For the next 24 hours, DO NOT: -Drive a car -Operate machinery -Drink alcoholic beverages -Take any medication unless instructed by your physician -Make any legal decisions or sign important papers.  Meals: Start with liquid foods such as gelatin or soup. Progress to regular foods as tolerated. Avoid greasy, spicy, heavy foods. If nausea and/or vomiting occur, drink only clear liquids until the nausea and/or vomiting subsides. Call your physician if vomiting continues.  Special Instructions/Symptoms: Your throat may feel dry or sore from the anesthesia or the breathing tube placed in your throat during surgery. If this causes discomfort, gargle with warm salt water. The discomfort should disappear within 24 hours.  If you had a scopolamine patch placed behind your ear for the management of post- operative nausea and/or vomiting:  1. The medication in the patch is effective for 72 hours, after which it should be removed.  Wrap patch in a tissue and discard in the trash. Wash hands thoroughly with soap and water. 2. You may remove the patch earlier than 72 hours if you experience unpleasant side effects which may include dry mouth, dizziness or visual disturbances. 3. Avoid touching the patch. Wash your hands with soap and water after contact with the patch.   ANORECTAL SURGERY: POST OP INSTRUCTIONS 1. Take your usually prescribed home medications unless otherwise directed. 2. DIET: During the first few hours after surgery sip on some liquids until you are able to urinate.  It is normal to not urinate for several hours after this surgery.  If you feel uncomfortable, please contact the office for instructions.  After you are able to urinate,you may eat, if you feel like it.  Follow a light bland diet the first 24  hours after arrival home, such as soup, liquids, crackers, etc.  Be sure to include lots of fluids daily (6-8 glasses).  Avoid fast food or heavy meals, as your are more likely to get nauseated.  Eat a low fat diet the next few days after surgery.  Limit caffeine intake to 1-2 servings a day. 3. PAIN CONTROL: a. Pain is best controlled by a usual combination of several different methods TOGETHER: i. Muscle relaxation: Soak in a warm bath (or Sitz bath) three times a day and after bowel movements.  Continue to do this until all pain is resolved. ii. Over the counter pain medication iii. Prescription pain medication b. Most patients will experience some swelling and discomfort in the anus/rectal area and incisions.  Heat such as warm towels, sitz baths, warm baths, etc to help relax tight/sore spots and speed recovery.  Some people prefer to use ice, especially in the first couple days after surgery, as it may decrease the pain and swelling, or alternate between ice & heat.  Experiment to what works for you.  Swelling and bruising can take several weeks to resolve.  Pain can take even longer to completely resolve. c. It is helpful to take an over-the-counter pain medication regularly for the first few weeks.  Choose one of the following that works best for you: i. Naproxen (Aleve, etc)  Two 220mg tabs twice a day ii. Ibuprofen (Advil, etc) Three 200mg tabs four times a day (every meal & bedtime) d. A  prescription for pain medication (such as percocet, oxycodone, hydrocodone, etc) should be given to you upon   upon discharge.  Take your pain medication as prescribed.  i. If you are having problems/concerns with the prescription medicine (does not control pain, nausea, vomiting, rash, itching, etc), please call us [phone] to see if we need to switch you to a different pain medicine that will work better for you and/or control your side effect better. ii. If you need a refill on your pain medication, please  contact your pharmacy.  They will contact our office to request authorization. Prescriptions will not be filled after 5 pm or on week-ends. 4. KEEP YOUR BOWELS REGULAR and AVOID CONSTIPATION a. The goal is one to two soft bowel movements a day.  You should at least have a bowel movement every other day. b. Avoid getting constipated.  Between the surgery and the pain medications, it is common to experience some constipation. This can be very painful after rectal surgery.  Increasing fluid intake and taking a fiber supplement (such as Metamucil, Citrucel, FiberCon, etc) 1-2 times a day regularly will usually help prevent this problem from occurring.  A stool softener like colace is also recommended.  This can be purchased over the counter at your pharmacy.  You can take it up to 3 times a day.  If you do not have a bowel movement after 24 hrs since your surgery, take one does of milk of magnesia.  If you still haven't had a bowel movement 8-12 hours after that dose, take another dose.  If you don't have a bowel movement 48 hrs after surgery, purchase a Fleets enema from the drug store and administer gently per package instructions.  If you still are having trouble with your bowel movements after that, please call the office for further instructions. c. If you develop diarrhea or have many loose bowel movements, simplify your diet to bland foods & liquids for a few days.  Stop any stool softeners and decrease your fiber supplement.  Switching to mild anti-diarrheal medications (Kayopectate, Pepto Bismol) can help.  If this worsens or does not improve, please call us.  5. Wound Care a. Remove your bandages before your first bowel movement or 8 hours after surgery.     b. Remove any wound packing material at this tim,e as well.  You do not need to repack the wound unless instructed otherwise.  Wear an absorbent pad or soft cotton gauze in your underwear to catch any drainage and help keep the area clean. You  should change this every 2-3 hours while awake. c. Keep the area clean and dry.  Bathe / shower every day, especially after bowel movements.  Keep the area clean by showering / bathing over the incision / wound.   It is okay to soak an open wound to help wash it.  Wet wipes or showers / gentle washing after bowel movements is often less traumatic than regular toilet paper. d. You may have some styrofoam-like soft packing in the rectum which will come out with the first bowel movement.  e. You will often notice bleeding with bowel movements.  This should slow down by the end of the first week of surgery f. Expect some drainage.  This should slow down, too, by the end of the first week of surgery.  Wear an absorbent pad or soft cotton gauze in your underwear until the drainage stops. g. Do Not sit on a rubber or pillow ring.  This can make you symptoms worse.  You may sit on a soft pillow if needed.    6. ACTIVITIES as tolerated:   a. You may resume regular (light) daily activities beginning the next day--such as daily self-care, walking, climbing stairs--gradually increasing activities as tolerated.  If you can walk 30 minutes without difficulty, it is safe to try more intense activity such as jogging, treadmill, bicycling, low-impact aerobics, swimming, etc. b. Save the most intensive and strenuous activity for last such as sit-ups, heavy lifting, contact sports, etc  Refrain from any heavy lifting or straining until you are off narcotics for pain control.   c. You may drive when you are no longer taking prescription pain medication, you can comfortably sit for long periods of time, and you can safely maneuver your car and apply brakes. d. You may have sexual intercourse when it is comfortable.  7. FOLLOW UP in our office a. Please call [facility] at [phone] to set up an appointment to see your surgeon in the office for a follow-up appointment approximately 3-4 weeks after your surgery. b. Make sure that  you call for this appointment the day you arrive home to insure a convenient appointment time. 10. IF YOU HAVE DISABILITY OR FAMILY LEAVE FORMS, BRING THEM TO THE OFFICE FOR PROCESSING.  DO NOT GIVE THEM TO YOUR DOCTOR.     WHEN TO CALL US [phone]: 1. Poor pain control 2. Reactions / problems with new medications (rash/itching, nausea, etc)  3. Fever over 101.5 F (38.5 C) 4. Inability to urinate 5. Nausea and/or vomiting 6. Worsening swelling or bruising 7. Continued bleeding from incision. 8. Increased pain, redness, or drainage from the incision  The clinic staff is available to answer your questions during regular business hours (8:30am-5pm).  Please don't hesitate to call and ask to speak to one of our nurses for clinical concerns.   A surgeon from [facility] is always on call at the hospitals   If you have a medical emergency, go to the nearest emergency room or call 911.    [facility] [address], [address], [place], [place]  [address] ? MAIN: [phone] ? TOLL FREE: [phone] ? FAX [phone] [email or web address]    

## 2016-11-29 NOTE — H&P (View-Only) (Signed)
History of Present Illness Leighton Ruff MD; [id number] 11:08 AM) The patient is a 45 year old male who presents with anal pain. 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 The patient has AIDS with [place] CD4 count at 110. HIV viral loads or undetectable. He complains of severe anal pain. He denies any fevers. He has occasional bleeding. He denies any constipation. He appears to be non-compliant with his medications.   Past Surgical History Nance Pear, [place]; 11/14/2016 10:52 AM) Foot Surgery  Right. Oral Surgery   Allergies Bary Castilla [place], [place]; 11/14/2016 10:53 AM) No Known Drug Allergies 11/14/2016  Medication History Nance Pear, [place]; 11/14/2016 10:53 AM) [name] (150-150-200-10MG  Tablet, Oral two times daily) Active. ValACYclovir HCl (1GM Tablet, Oral daily) Active. Medications Reconciled  Social History Nance Pear, [place]; 11/14/2016 10:52 AM) Alcohol use  Recently quit alcohol use. Caffeine use  Coffee. Illicit drug use  Uses socially only. Tobacco use  Current every day smoker.  Family History Nance Pear, [place]; 11/14/2016 10:52 AM) Cancer  Family Members In General. Respiratory Condition  Brother.  Other Problems Nance Pear, [place]; 11/14/2016 10:52 AM) Anxiety Disorder  Bladder Problems  Hemorrhoids  HIV-positive     Review of Systems ([place]; 11/14/2016 10:52 AM) General Present- Fatigue. Not Present- Appetite Loss, Chills, Fever, Night Sweats, Weight Gain and Weight Loss. Skin Present- Change in Wart/Mole. Not Present- Dryness, Hives, Jaundice, New Lesions, Non-Healing Wounds, Rash and Ulcer. HEENT Not Present- Earache, Hearing Loss, Hoarseness, Nose Bleed, Oral Ulcers, Ringing in the Ears, Seasonal Allergies, Sinus Pain, Sore Throat, Visual Disturbances, Wears glasses/contact lenses and Yellow Eyes. Respiratory Not Present- Bloody sputum, Chronic Cough, Difficulty Breathing,  Snoring and Wheezing. Cardiovascular Not Present- Chest Pain, Difficulty Breathing Lying Down, Leg Cramps, Palpitations, Rapid Heart Rate, Shortness of Breath and Swelling of Extremities. Gastrointestinal Present- Bloody Stool, Change in Bowel Habits, Chronic diarrhea, Constipation, Excessive gas, Hemorrhoids and Rectal Pain. Not Present- Abdominal Pain, Bloating, Difficulty Swallowing, Gets full quickly at meals, Indigestion, Nausea and Vomiting. Male Genitourinary Present- Painful Urination. Not Present- Blood in Urine, Change in Urinary Stream, Frequency, Impotence, Nocturia, Urgency and Urine Leakage. Neurological Present- Tingling and Trouble walking. Not Present- Decreased Memory, Fainting, Headaches, Numbness, Seizures, Tremor and Weakness. Psychiatric Present- Anxiety and Change in Sleep Pattern. Not Present- Bipolar, Depression, Fearful and Frequent crying. Hematology Present- HIV. Not Present- Blood Thinners, Easy Bruising, Excessive bleeding, Gland problems and Persistent Infections.  Vitals Bary Castilla [place] CMA; 11/14/2016 10:54 AM) 11/14/2016 10:53 AM Weight: 148.4 lb Height: 75in Body Surface Area: 1.93 m Body Mass Index: 18.55 kg/m  Pulse: 110 (Regular)  BP: 122/78 (Sitting, Left Arm, Standard)       Physical Exam Leighton Ruff MD; [id number] 11:11 AM) General Mental Status-Alert. General Appearance-Not in acute distress. Build & Nutrition-Well nourished. Posture-Normal posture. Gait-Normal.  Head and Neck Head-normocephalic, atraumatic with no lesions or palpable masses. Trachea-midline.  Chest and Lung Exam Chest and lung exam reveals -on auscultation, normal breath sounds, no adventitious sounds and normal vocal resonance.  Cardiovascular Cardiovascular examination reveals -normal heart sounds, regular rate and rhythm with no murmurs and no digital clubbing, cyanosis, edema, increased warmth or  tenderness.  Abdomen Inspection Inspection of the abdomen reveals - No Hernias. Palpation/Percussion Palpation and Percussion of the abdomen reveal - Soft, Non Tender, No Rigidity (guarding), No hepatosplenomegaly and No Palpable abdominal masses.  Rectal Anorectal Exam External - Note: Mild anal condyloma noted with perianal discharge. Internal - Note: Anal  nodule at posterior midline. Significant tenderness to palpation circumferentially. No other obvious masses noted.  Neurologic Neurologic evaluation reveals -alert and oriented x 3 with no impairment of recent or remote memory, normal attention span and ability to concentrate, normal sensation and normal coordination.  Musculoskeletal Normal Exam - Bilateral-Upper Extremity Strength Normal and Lower Extremity Strength Normal.    Assessment & Plan Leighton Ruff MD; [id number] 11:10 AM) ANAL OR RECTAL PAIN (K62.89) Impression: Patient having severe pain in and around his anal canal. He has some mild condyloma externally but no obvious source noted. No fissure noted. He does have a large skin tag and posterior midline. Given that he is unable to complete an exam here in the office, I have recommended an exam under anesthesia with treatment as needed.

## 2016-11-29 NOTE — Anesthesia Procedure Notes (Signed)
Procedure Name: MAC Date/Time: 11/29/2016 9:28 AM Performed by: Myrtie Soman Pre-anesthesia Checklist: Patient identified, Emergency Drugs available, Suction available, Patient being monitored and Timeout performed Oxygen Delivery Method: Simple face mask Placement Confirmation: positive ETCO2 and breath sounds checked- equal and bilateral

## 2016-11-29 NOTE — Transfer of Care (Signed)
  Last Vitals:  Vitals:   11/29/16 0753  BP: 117/74  Pulse: 91  Resp: 16  Temp: 36.5 C    Last Pain:  Vitals:   11/29/16 0811  TempSrc:   PainSc: 7          Immediate Anesthesia Transfer of Care Note  Patient: Juan Harrison  Procedure(s) Performed: Procedure(s) (LRB): EXAM UNDER ANESTHESIA (N/A) BIOPSY ANAL CANAL MASS (N/A)  Patient Location: PACU  Anesthesia Type: MAC  Level of Consciousness: awake, alert  and oriented  Airway & Oxygen Therapy: Patient Spontanous Breathing and Patient connected to face mask oxygen  Post-op Assessment: Report given to PACU RN and Post -op Vital signs reviewed and stable  Post vital signs: Reviewed and stable  Complications: No apparent anesthesia complications

## 2016-11-29 NOTE — Anesthesia Postprocedure Evaluation (Signed)
Anesthesia Post Note  Patient: Juan Harrison  Procedure(s) Performed: Procedure(s) (LRB): EXAM UNDER ANESTHESIA (N/A) BIOPSY ANAL CANAL MASS (N/A)  Patient location during evaluation: PACU Anesthesia Type: MAC Level of consciousness: awake and alert Pain management: pain level controlled Vital Signs Assessment: post-procedure vital signs reviewed and stable Respiratory status: spontaneous breathing, nonlabored ventilation, respiratory function stable and patient connected to nasal cannula oxygen Cardiovascular status: stable and blood pressure returned to baseline Anesthetic complications: no       Last Vitals:  Vitals:   11/29/16 1015 11/29/16 1030  BP: 108/62 (!) 96/58  Pulse: 66 73  Resp: 12 13  Temp:      Last Pain:  Vitals:   11/29/16 1030  TempSrc:   PainSc: 0-No pain                 Leighanna Kirn S

## 2016-11-29 NOTE — Anesthesia Preprocedure Evaluation (Signed)
Anesthesia Evaluation  Patient identified by MRN, date of birth, ID band Patient awake    Reviewed: Allergy & Precautions, NPO status , Patient's Chart, lab work & pertinent test results  Airway Mallampati: II  TM Distance: >3 FB Neck ROM: Full    Dental no notable dental hx.    Pulmonary neg pulmonary ROS, former smoker,    Pulmonary exam normal breath sounds clear to auscultation       Cardiovascular negative cardio ROS Normal cardiovascular exam Rhythm:Regular Rate:Normal     Neuro/Psych negative neurological ROS  negative psych ROS   GI/Hepatic negative GI ROS, (+) Hepatitis -, B  Endo/Other  negative endocrine ROS  Renal/GU negative Renal ROS  negative genitourinary   Musculoskeletal negative musculoskeletal ROS (+)   Abdominal   Peds negative pediatric ROS (+)  Hematology  (+) HIV,   Anesthesia Other Findings   Reproductive/Obstetrics negative OB ROS                             Anesthesia Physical Anesthesia Plan  ASA: III  Anesthesia Plan: MAC   Post-op Pain Management:    Induction: Intravenous  Airway Management Planned: Simple Face Mask  Additional Equipment:   Intra-op Plan:   Post-operative Plan:   Informed Consent: I have reviewed the patients History and Physical, chart, labs and discussed the procedure including the risks, benefits and alternatives for the proposed anesthesia with the patient or authorized representative who has indicated his/her understanding and acceptance.   Dental advisory given  Plan Discussed with: CRNA and Surgeon  Anesthesia Plan Comments:         Anesthesia Quick Evaluation

## 2016-11-29 NOTE — Interval H&P Note (Signed)
History and Physical Interval Note:  11/29/2016 8:10 AM  Juan Harrison  has presented today for surgery, with the diagnosis of ANAL PAIN  The various methods of treatment have been discussed with the patient and family. After consideration of risks, benefits and other options for treatment, the patient has consented to  Procedure(s): EXAM UNDER ANESTHESIA (N/A) BIOPSY RECTAL (N/A) as a surgical intervention .  The patient's history has been reviewed, patient examined, no change in status, stable for surgery.  I have reviewed the patient's chart and labs.  Questions were answered to the patient's satisfaction.     Rosario Adie, MD  [facility]

## 2016-12-03 ENCOUNTER — Telehealth: Payer: Self-pay | Admitting: General Surgery

## 2016-12-03 ENCOUNTER — Encounter (HOSPITAL_BASED_OUTPATIENT_CLINIC_OR_DEPARTMENT_OTHER): Payer: Self-pay | Admitting: General Surgery

## 2016-12-03 NOTE — Telephone Encounter (Signed)
Notified pt of cancer diagnosis.  All questions answered.

## 2016-12-04 ENCOUNTER — Other Ambulatory Visit: Payer: Self-pay | Admitting: *Deleted

## 2016-12-04 ENCOUNTER — Other Ambulatory Visit: Payer: Self-pay | Admitting: Surgery

## 2016-12-04 ENCOUNTER — Encounter: Payer: Self-pay | Admitting: Radiation Oncology

## 2016-12-04 ENCOUNTER — Telehealth: Payer: Self-pay | Admitting: *Deleted

## 2016-12-04 DIAGNOSIS — K6289 Other specified diseases of anus and rectum: Secondary | ICD-10-CM

## 2016-12-04 DIAGNOSIS — C210 Malignant neoplasm of anus, unspecified: Secondary | ICD-10-CM

## 2016-12-04 NOTE — Progress Notes (Signed)
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 Patient is in a lot of pain. Dr. Marcello Moores has ordered scans.

## 2016-12-04 NOTE — Telephone Encounter (Signed)
Oncology Nurse Navigator Documentation  Oncology Nurse Navigator Flowsheets 12/04/2016  Navigator Location [facility]  Referral date to RadOnc/MedOnc 12/04/2016  Navigator Encounter Type Introductory phone call  Abnormal Finding Date 11/14/2016  Confirmed Diagnosis Date 11/29/2016  Spoke with patient and provided new patient appointment for 12/10/16 at 2:30 pm with Dr. Burr Medico. Reviewed his appointment with Dr. Lisbeth Renshaw for 1/4 at 1:30 pm. Also reviewed his CT scan for 12/06/16 and where this is located. Informed of location of [place], valet service, and registration process. Reminded to bring photo ID, insurance cards and a current medication list, including supplements. Patient verbalizes understanding. Notified HIM.

## 2016-12-04 NOTE — Progress Notes (Addendum)
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 Gabriel Carina today for contrast,  To drink tomorrow ,  For EBUS  Dr. Leighton Ruff, MD, dysuria at times,   Nausea / Vomiting, if any: No  Pain,  5/10 , rectal last pain med OXY  IR 5mg   yesterday  Any blood per rectum:   Wipes blood on tissue with bowel movements, none today  SAFETY ISSUES:  Prior radiation? NO  Pacemaker/ICD? NO  Is the patient on methotrexate? NO  Current Complaints/Details:  Single, no children, HIV+, Aids, smokes cigarettes 1ppd 20 years,  Down to 3 cigarettes a day now since dx,  and marijuana  drug use  Socially; recently quit 1.79months ago  Recent quit alcohol, same time frame,  Anxiety, Mother cancer unknown,  Deceased at age 88,  BP 107/65 (BP Location: Right Arm, Patient Position: Sitting, Cuff Size: Normal)   Pulse 78   Temp 98.4 F (36.9 C) (Oral)   Resp 20   Ht 6\' 3"  (1.905 m)   Wt 151 lb 6.4 oz (68.7 kg)   SpO2 100% Comment: room air  BMI 18.92 kg/m   Wt Readings from Last 3 Encounters:  12/05/16 151 lb 6.4 oz (68.7 kg)  11/29/16 146 lb (66.2 kg)  10/29/16 152 lb (68.9 kg)

## 2016-12-05 ENCOUNTER — Ambulatory Visit
Admission: RE | Admit: 2016-12-05 | Discharge: 2016-12-05 | Disposition: A | Discharge status: Home / Self Care | Payer: Medicare Other | Source: Ambulatory Visit | Attending: Radiation Oncology | Admitting: Radiation Oncology

## 2016-12-05 ENCOUNTER — Encounter: Payer: Self-pay | Admitting: Radiation Oncology

## 2016-12-05 ENCOUNTER — Other Ambulatory Visit: Payer: Self-pay | Admitting: General Surgery

## 2016-12-05 VITALS — BP 107/65 | HR 78 | Temp 98.4°F | Resp 20 | Ht 75.0 in | Wt 151.4 lb

## 2016-12-05 DIAGNOSIS — C210 Malignant neoplasm of anus, unspecified: Secondary | ICD-10-CM | POA: Insufficient documentation

## 2016-12-05 DIAGNOSIS — Z510 Encounter for antineoplastic radiation therapy: Secondary | ICD-10-CM | POA: Insufficient documentation

## 2016-12-05 DIAGNOSIS — B20 Human immunodeficiency virus [HIV] disease: Secondary | ICD-10-CM | POA: Insufficient documentation

## 2016-12-05 DIAGNOSIS — F17210 Nicotine dependence, cigarettes, uncomplicated: Secondary | ICD-10-CM | POA: Insufficient documentation

## 2016-12-05 DIAGNOSIS — K6289 Other specified diseases of anus and rectum: Secondary | ICD-10-CM

## 2016-12-05 DIAGNOSIS — Z21 Asymptomatic human immunodeficiency virus [HIV] infection status: Secondary | ICD-10-CM | POA: Diagnosis not present

## 2016-12-05 MED ORDER — POLYETHYLENE GLYCOL 3350 17 GM/SCOOP PO POWD: 17.0000 g | Freq: Every day | ORAL | 1 refills | Status: DC

## 2016-12-05 NOTE — Addendum Note (Signed)
Encounter addended by: Hayden Pedro, PA-C on: 12/05/2016 10:35 PM<BR>    Actions taken: Sign clinical note, LOS modified, Follow-up modified

## 2016-12-05 NOTE — Progress Notes (Signed)
Radiation Oncology         [phone] ________________________________  Name: Juan Harrison MRN: [id number]  Date: 12/05/2016  DOB: [birth year]  CC:No PCP Per Patient  Leighton Ruff, MD     REFERRING PHYSICIAN: Leighton Ruff, MD   DIAGNOSIS: The encounter diagnosis was Anal cancer [facility]).   HISTORY OF PRESENT ILLNESS: Juan Harrison is a 46 y.o. male seen at the request of Dr. Marcello Moores for a new diagnosis of anal cancer. He has a history of HIV with an undetectable viral load, but a CD4 count of about 110.  The patient presented to his PCP in November due to anal pain and bleeding. He met with Dr. Marcello Moores, and was unable to tolerate anoscopy and exam in the office. His biopsy was deferred to the OR, and he had an exam under anesthesia on 11/29/16. A large posterior mass was palpated during this exam, and a biopsy confirmed squamous cell carcinoma. He has plans to undergo CT C/A/P tomorrow, and will meet with Dr. Burr Medico next Tuesday. He comes today to discuss the role of radiotherapy with Dr. Lisbeth Renshaw.    PREVIOUS RADIATION THERAPY: No   PAST MEDICAL HISTORY:  Past Medical History:  Diagnosis Date  . 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 Anal pain   . 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 Condyloma   . Hepatitis B surface antigen positive        PAST SURGICAL HISTORY: Past Surgical History:  Procedure Laterality Date  . RECTAL BIOPSY N/A 11/29/2016   Procedure: BIOPSY ANAL CANAL MASS;  Surgeon: Leighton Ruff, MD;  Location: [facility];  Service: General;  Laterality: N/A;  . RECTAL EXAM UNDER ANESTHESIA N/A 11/29/2016   Procedure: EXAM UNDER ANESTHESIA;  Surgeon: Leighton Ruff, MD;  Location: [facility];  Service: General;  Laterality: N/A;  . 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 Cancer Mother     deceased 02-13-2016  . Asthma Brother      SOCIAL HISTORY:  reports that he quit smoking about 7 weeks ago. His smoking use included Cigarettes. He has a 20.00 pack-year smoking history. He has never used smokeless tobacco. He reports that he drinks about 0.6 oz of alcohol per week . He reports that he uses drugs, including Marijuana. The patient is single and resides in [place]. He is unemployed.    ALLERGIES: Patient has no known allergies.   MEDICATIONS:  Current Outpatient Prescriptions  Medication Sig Dispense Refill  . acetaminophen (TYLENOL) 500 MG tablet Take 500 mg by mouth every 6 (six) hours as needed.    . elvitegravir-cobicistat-emtricitabine-tenofovir (GENVOYA) 150-150-200-10 MG TABS tablet Take 1 tablet by mouth daily with breakfast. 90 tablet 3  . ibuprofen (ADVIL,MOTRIN) 200 MG tablet Take 200 mg by mouth as needed.    Marland Kitchen oxyCODONE (OXY IR/ROXICODONE) 5 MG immediate release tablet Take 1 tablet (5 mg total) by mouth every 6 (six) hours as needed. 30 tablet 0  . polyethylene glycol powder (MIRALAX) powder Take 17 g by mouth daily. 255 g 1  . valACYclovir (VALTREX) 1000 MG tablet Take 1 tablet (1,000 mg total) by mouth 2 (two) times daily. (Patient not taking: Reported on 12/05/2016) 20 tablet 0   No current facility-administered  medications for this encounter.      REVIEW OF SYSTEMS: On review of systems, the patient reports that he is doing okay. He is managing anal pain with oxycodone, and reports that this helps, though makes him constipated. He is not taking anything to help with constipation due to cost. He also reports occasional rectal bleeding with bowel movements. He denies any chest pain, shortness of breath, cough, fevers, chills, night sweats, unintended weight changes. He denies any  bladder disturbances, and denies abdominal pain, nausea or vomiting. He denies any new musculoskeletal or joint aches or pains. A complete review of systems  is obtained and is otherwise negative.     PHYSICAL EXAM:  Wt Readings from Last 3 Encounters:  12/05/16 151 lb 6.4 oz (68.7 kg)  11/29/16 146 lb (66.2 kg)  10/29/16 152 lb (68.9 kg)   Temp Readings from Last 3 Encounters:  12/05/16 98.4 F (36.9 C) (Oral)  11/29/16 97.8 F (36.6 C) (Oral)  10/29/16 97.7 F (36.5 C) (Oral)   BP Readings from Last 3 Encounters:  12/05/16 107/65  11/29/16 118/75  10/29/16 119/76   Pulse Readings from Last 3 Encounters:  12/05/16 78  11/29/16 74  10/29/16 81     Pain scale 9/10 In general this is a well appearing African American male in no acute distress. He is alert and oriented x4 and appropriate throughout the examination. HEENT reveals that the patient is normocephalic, atraumatic. EOMs are intact. PERRLA. Skin is intact without any evidence of gross lesions. Cardiovascular exam reveals a regular rate and rhythm, no clicks rubs or murmurs are auscultated. Chest is clear to auscultation bilaterally. Lymphatic assessment is performed and does not reveal any adenopathy in the  supraclavicular, axillary, or inguinal chains. There is subtle fullness in the left cervical region without distinct adenopathy. Abdomen has active bowel sounds in all quadrants and is intact. The abdomen is soft, non tender, non distended. Lower extremities are negative for pretibial pitting edema, deep calf tenderness, cyanosis or clubbing. The patient declines DRE but visualization of the anus reveals condylomatous change with changes circumferentially at the anal verge consistent with high grade dysplasia.   ECOG =1  0 - Asymptomatic (Fully active, able to carry on all predisease activities without restriction)  1 - Symptomatic but completely ambulatory (Restricted in physically strenuous activity but ambulatory and able to carry out work of a light or sedentary nature. For example, light housework, office work)  2 - Symptomatic, <50% in bed during the day (Ambulatory  and capable of all self care but unable to carry out any work activities. Up and about more than 50% of waking hours)  3 - Symptomatic, >50% in bed, but not bedbound (Capable of only limited self-care, confined to bed or chair 50% or more of waking hours)  4 - Bedbound (Completely disabled. Cannot carry on any self-care. Totally confined to bed or chair)  5 - Death   Eustace Pen MM, Creech RH, Tormey DC, et al. [phone]). "Toxicity and response criteria of the [employer]". [place] Oncol. 5 (6): 649-55    LABORATORY DATA:  Lab Results  Component Value Date   WBC 5.2 10/29/2016   HGB 14.5 11/29/2016   HCT 41.2 10/29/2016   MCV 87.8 10/29/2016   PLT 219 10/29/2016   Lab Results  Component Value Date   NA 138 10/29/2016   K 4.0 10/29/2016   CL 102 10/29/2016   CO2 27 10/29/2016   Lab Results  Component Value Date   ALT 25 10/29/2016   AST 20 10/29/2016   ALKPHOS 66 10/29/2016   BILITOT 0.5 10/29/2016      RADIOGRAPHY: No results found.     IMPRESSION/PLAN: 1. 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 Dr. Lisbeth Renshaw discusses the nature of this disease, and reviews his pathology results, EUA description of the tumor, and the plans to complete his work up to rule out metastatic disease. Dr. Lisbeth Renshaw would like to have the patient move forward with the CT scheduled for tomorrow, as well as a PET scan. This will be performed on 12/17/16. We will proceed with CT simulation on 12/11/16 and hope to have the PET scan occur sooner than 12/17/16 if possible. We reviewed the risks, benefits, short, and long term effects of treatment. Dr. Lisbeth Renshaw discusses the delivery and logistics of 30 treatments over 6 weeks, and emphasizes the importance of the patient continuing his antiviral therapy for his HIV due to his low CD4 counts. He has signed written consent and will return next week for simulation, with the anticipation of beginning radiation on 12/16/16. Dr. Burr Medico will outline  recommendations for systemic therapy as well. 2. HIV. As above Dr. Lisbeth Renshaw has recommended continuing his antiviral therapy. The patient continues to be followed closely by Dr. Clide Cliff in Infectious Disease.    The above documentation reflects my direct findings during this shared patient visit. Please see the separate note by Dr. Lisbeth Renshaw on this date for the remainder of the patient's plan of care.    Carola Rhine, PAC

## 2016-12-05 NOTE — Progress Notes (Signed)
Please see the Nurse Progress Note in the MD Initial Consult Encounter for this patient. 

## 2016-12-05 NOTE — Addendum Note (Signed)
Encounter addended by: Hayden Pedro, PA-C on: 12/05/2016  4:05 PM<BR>    Actions taken: Order list changed, Diagnosis association updated, Pend clinical note

## 2016-12-05 NOTE — Addendum Note (Signed)
Encounter addended by: Hayden Pedro, PA-C on: 12/05/2016  3:18 PM<BR>    Actions taken: Order list changed

## 2016-12-06 ENCOUNTER — Telehealth: Payer: Self-pay | Admitting: *Deleted

## 2016-12-06 ENCOUNTER — Encounter: Payer: Self-pay | Admitting: *Deleted

## 2016-12-06 ENCOUNTER — Ambulatory Visit
Admission: RE | Admit: 2016-12-06 | Discharge: 2016-12-06 | Disposition: A | Discharge status: Home / Self Care | Payer: Medicare Other | Source: Ambulatory Visit | Attending: Surgery | Admitting: Surgery

## 2016-12-06 DIAGNOSIS — C20 Malignant neoplasm of rectum: Secondary | ICD-10-CM | POA: Diagnosis not present

## 2016-12-06 DIAGNOSIS — K6289 Other specified diseases of anus and rectum: Secondary | ICD-10-CM

## 2016-12-06 MED ORDER — IOPAMIDOL (ISOVUE-300) INJECTION 61%
100.0000 mL | Freq: Once | INTRAVENOUS | Status: AC | PRN
  Administered 2016-12-06: 100 mL via INTRAVENOUS

## 2016-12-06 MED FILL — POLYETHYLENE GLYCOL 3350: 15 days supply | Qty: 255 | Fill #0

## 2016-12-06 NOTE — Telephone Encounter (Signed)
Called patient to inform of Pet Scan for 12-17-16 @ [facility], lvm for a return call

## 2016-12-06 NOTE — Telephone Encounter (Signed)
FYI new patient appointment with Dr. Burr Medico on 12-10-2016.  "Sister of this patient calling for [place].  I need clarification of what my brother was told yesterday.  He's slow and disabled.  He needs help with transportation.  I live in [place].  Our mother passed in May so I will need to obtain health Care Power of attorney or guardianship.  I need his appointments.  I will need to drive to [facility] to make sure he keeps appointments or arrange transportation because he doesn't drive.  My number is [phone]."

## 2016-12-06 NOTE — Telephone Encounter (Signed)
Juan Harrison, please see message below. I hope his sister can come with him on 1/9 for his appointment with me  Truitt Merle MD

## 2016-12-06 NOTE — Progress Notes (Signed)
[place] Work  Clinical Social Work was referred by nurse for assessment of psychosocial needs due to possible transportation needs.  RN had received call from pt's sister stating she needed "clarification of what my brother was told yesterday.  He's slow and disabled.  He needs help with transportation.  I live in [place].  Our mother passed in May so I will need to obtain health Care Power of attorney or guardianship.  I need his appointments.  I will need to drive to [facility] to make sure he keeps appointments or arrange transportation because he doesn't drive.  My number is [phone]."    Clinical Social Worker reviewed chart and attempted to contact patient at home to offer support and assess for needs. On chart review, pt appears to have been able to attend to his own healthcare needs, arrives to his appointments on time and appears able to manage his concerns. CSW left supportive message for pt explaining role of CSW and inquiring if he needed assistance with transportation. CSW team to check in with pt at his upcoming appointment with Dr. Burr Medico. Pt can make own decisions regarding his healthcare he can designate his own POA as he wishes. He has medicaid in place to assist with transportation. CSW to follow.    Clinical Social Work interventions: Pt advocacy  Loren Racer, [place] Worker [place]  [place] Phone: [phone] Fax: [phone]

## 2016-12-06 NOTE — Telephone Encounter (Signed)
Sister  Notified that patient has an appt with Dr. Burr Medico  Medical Oncology at Cancer center 12/10/2016 at 2:30pm, she will be here for that appt, and knows patient has a Pet scan scheduled 12/17/16, and we cannot start radiation until those results are back,, also patient can get set up with scat transportation once  Patient starts treatment, may take a week  Though, she also asked about medical power of atty, which she can speak with the social workers that day, along with the patient and get appropriate papers signed and notorized, just let Medical oncology staff know, sister thanked this Therapist, sports , will route to social workers an med onc 1:42 PM

## 2016-12-09 ENCOUNTER — Telehealth: Payer: Self-pay | Admitting: Radiation Oncology

## 2016-12-09 NOTE — Telephone Encounter (Signed)
I spoke with the patient and he is happy for [place] to speak with his sister Geni Bers about his care. We reviewed briefly his CT scan and he will go for PET scan next Tuesday, though we're trying to see if there are cancellations which could allow him to have this done sooner. Jacqueline's phone does not have VM set up, but I called back and we spoke about his imaging, his diagnosis, and recommendations for treatment. She will be here tomorrow with the patient for his visit with Dr. Burr Medico. She would like to coordinate transportation and help set up POA information with social work as well.

## 2016-12-10 ENCOUNTER — Encounter: Payer: Self-pay | Admitting: Hematology

## 2016-12-10 ENCOUNTER — Encounter: Payer: Self-pay | Admitting: *Deleted

## 2016-12-10 ENCOUNTER — Ambulatory Visit (HOSPITAL_BASED_OUTPATIENT_CLINIC_OR_DEPARTMENT_OTHER): Payer: Medicare Other | Admitting: Hematology

## 2016-12-10 VITALS — BP 107/59 | HR 71 | Temp 98.8°F | Resp 20 | Ht 75.0 in | Wt 149.1 lb

## 2016-12-10 DIAGNOSIS — B20 Human immunodeficiency virus [HIV] disease: Secondary | ICD-10-CM

## 2016-12-10 DIAGNOSIS — K625 Hemorrhage of anus and rectum: Secondary | ICD-10-CM

## 2016-12-10 DIAGNOSIS — C211 Malignant neoplasm of anal canal: Secondary | ICD-10-CM | POA: Diagnosis not present

## 2016-12-10 DIAGNOSIS — C210 Malignant neoplasm of anus, unspecified: Secondary | ICD-10-CM | POA: Insufficient documentation

## 2016-12-10 NOTE — Progress Notes (Addendum)
[place] Work  Clinical Social Work was referred by radiation oncology and nurse navigator for assessment of psychosocial needs, specifically transportation concerns.  Clinical Social Worker met with patient and patient's sister, [name], to offer support and assess for needs.  CSW and patient completed application for [employer] transportation.  Mr. Rosselli reported no other concerns at this time.  The patient stated he lives alone and has a good support system- identified a few close friends and his sister whom lives in [place].  Patient was agreeable to completing healthcare advance directives during today's visit. The patient designated sister, [name], as their primary healthcare agent and no secondary agent.  Patient also completed healthcare living will.    Clinical Social Worker notarized documents and made copies for patient/family. Clinical Social Worker will send documents to medical records to be scanned into patient's chart and encouraged patient to share a copy with Dr. Algis Downs at his next appointment. Clinical Social Worker encouraged patient/family to contact with any additional questions or concerns.  Polo Riley, MSW, [place] Worker [facility] [phone]

## 2016-12-10 NOTE — Progress Notes (Signed)
Oncology Nurse Navigator Documentation  Oncology Nurse Navigator Flowsheets 12/10/2016  Navigator Location [facility]  Referral date to RadOnc/MedOnc -  Navigator Encounter Type Initial MedOnc  Abnormal Finding Date -  Confirmed Diagnosis Date -  Patient Visit Type MedOnc;Initial  Treatment Phase Pre-Tx/Tx Discussion  Barriers/Navigation Needs Education;Coordination of Care  Education Newly Diagnosed Cancer Education;Understanding Cancer/ Treatment Options;Preparing for Upcoming  Treatment  Interventions Education;Coordination of Care;Referrals  Referrals Social Work--escorted patient and his sister to see CSW after office visit   Coordination of Care Appts  Education Method Verbal;Written;Teach-back  Support Groups/Services GI Support Group;[place];Other--[place] Support Services  Acuity Level 2  Met with patient and sister, Juan Harrison during new patient visit. Explained the role of the GI Nurse Navigator and provided New Patient Packet with information on: 1. Anal cancer--anatomy of GI tract 2. Support groups 3. Advanced Directives 4. Fall Safety Plan Answered questions, reviewed current treatment plan using TEACH back and provided emotional support. Provided copy of current treatment plan. Informed him we will try to get his PET scan moved up to this week. Chemo class scheduled for tomorrow prior to his [facility] visit and labs. Called IR and scheduled his PICC line for 1/15 at 0830/0900. Education nurse will make him aware of this appointment tomorrow and have him go to scheduler to set up his chemo and weekly lab appointments as well. Mr. Toft reports he is able to carry out all ADL's independently and has a couple friends who will help him at home if necessary. If this does not go through, he may qualify for personal care assistance through his Medicaid. Sister is working on transportation for him through his Medicaid as well. He has been educated that  he is high risk for infection due to chemo lowering counts along with his HIV status. He is already familiar with PICC line, having one in the past for medications at home.  Merceda Elks, RN, BSN GI Oncology [place]

## 2016-12-10 NOTE — Progress Notes (Signed)
[place]  [phone] [phone]  Clinic New Consult Note   Patient Care Team: No Pcp Per Patient as PCP - General (General Practice) 12/10/2016  Referring physician: Dr. 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 Marcello Harrison in OR:"There was an obvious anal mass noted in the left posterior lateral anal canal.  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   I then placed a Hill-Ferguson anoscope into the anal canal and evaluated this completely.  The mass was noted with an area of central necrosis invading into the sphincter complex.  The edges of the ulcer were trimmed away using scissors.  This was sent to pathology for further examination       12/06/2016 Imaging    CT chest, abdomen and pelvis with contrast showed apparent wall thickening in the distal rectum and anus, this is associated with 2 posterior left pelvic sidewall nodes, highly suspicious for metastatic disease. 10 mm right upper lobe pulmonary nodule has essential stippled calcification and associated calcified nodule tissue in the right hilum, probably granuloma. No other distant metastasis.       12/10/2016 Initial Diagnosis    Anal cancer ([place])      HISTORY OF PRESENTING ILLNESS:  Juan Harrison 46 y.o. male with past medical history of HIV, is here because of His recent diagnosed anal cancer. He is accompanied by his sister to the clinic today.  He has had bloody BM and rectal pain for one month, his  rectal bleeding is mild, mixed with stool, no blood clots. He rates his rectal pain at 7-8/10, especially with bowel movement. he takes oxycodone 1-2/day.   Patient was seen by colorectal surgeon Dr. Marcello Harrison, could not tolerate rectal exam and anoscope in office, was brought to the OR for the above exam, which showed " There was an obvious anal mass noted in the left posterior lateral anal canal.  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   I then placed a Hill-Ferguson anoscope into the anal canal and evaluated this completely.  The mass was noted with an area of central necrosis invading into the sphincter complex.  The edges of the ulcer were trimmed away using scissors.  This was sent to pathology for further examination". The biopsy showed squamous cell carcinoma.  He was found to be HIV positive in 2007, has been seen Per ID Dr. Johnnye Harrison, not very compliant. He previously on  DRVr/TRV/EPZ bactrim, changed to genvoya 05-2016.  His last CD4 was 110 on 10/29/2016.   He is single, lives alone in a two story townhouse. He does not work, on [employer]. He has a sister Juan Harrison who lives in [place]. He has some friends, only able to help if he needs.  MEDICAL HISTORY:  Past Medical History:  Diagnosis Date  . AIDS [facility]) infectious disease--- dr hatcher   dx 2007,  usCD4,  count at 110  . Anal pain   .  Cancer (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 Condyloma   . Hepatitis B surface antigen positive     SURGICAL HISTORY: Past Surgical History:  Procedure Laterality Date  . RECTAL BIOPSY N/A 11/29/2016   Procedure: BIOPSY ANAL CANAL MASS;  Surgeon: Juan Ruff, MD;  Location: [facility];  Service: General;  Laterality: N/A;  . RECTAL EXAM UNDER ANESTHESIA N/A 11/29/2016   Procedure: EXAM UNDER ANESTHESIA;  Surgeon: Juan Ruff, MD;  Location: [facility];   Service: General;  Laterality: N/A;  . 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 Marital status: Single    Spouse name: N/A  . Number of children: N/A  . Years of education: N/A   Occupational History  . Not on file.   Social History Main Topics  . Smoking status: Current Every Day Smoker    Packs/day: 0.25    Years: 20.00    Types: Cigarettes    Last attempt to quit: 10/13/2016  . Smokeless tobacco: Never Used     Comment: 2-3 cigarettes a day   . Alcohol use No     Comment: He used to drink alcohol on weekends for 25 years, he quit on 10/14/2016   . Drug use:     Types: Marijuana  . Sexual activity: Not on file   Other Topics Concern  . Not on file   Social History Narrative   Single, lives alone about 10 minutes from [place] in 2 story townhouse   Has one sister, Juan Harrison who lives in [place] in past via friends   On disability    FAMILY HISTORY: Family History  Problem Relation Age of Onset  . COPD Mother   . Asthma Brother     ALLERGIES:  has No Known Allergies.  MEDICATIONS:  Current Outpatient Prescriptions  Medication Sig Dispense Refill  . acetaminophen (TYLENOL) 500 MG tablet Take 500 mg by mouth every 6 (six) hours as needed.    . elvitegravir-cobicistat-emtricitabine-tenofovir (GENVOYA) 150-150-200-10 MG TABS tablet Take 1 tablet by mouth daily with breakfast. 90 tablet 3  . ibuprofen (ADVIL,MOTRIN) 200 MG tablet Take 200 mg by mouth as needed.    Marland Kitchen oxyCODONE (OXY IR/ROXICODONE) 5 MG immediate release tablet Take 1 tablet (5 mg total) by mouth every 6 (six) hours as needed. 30 tablet 0  . polyethylene glycol powder (MIRALAX) powder Take 17 g by mouth daily. 255 g 1  . valACYclovir (VALTREX) 1000 MG tablet Take 1 tablet (1,000 mg total) by mouth 2 (two) times daily. 20 tablet 0   No current facility-administered medications for this visit.     REVIEW OF SYSTEMS:     Constitutional: Denies fevers, chills or abnormal night sweats Eyes: Denies blurriness of vision, double vision or watery eyes Ears, nose, mouth, throat, and face: Denies mucositis or sore throat Respiratory: Denies cough, dyspnea or wheezes Cardiovascular: Denies palpitation, chest discomfort or lower extremity swelling Gastrointestinal:  Denies nausea, heartburn or change in bowel habits Skin: Denies abnormal skin rashes Lymphatics: Denies new lymphadenopathy or easy bruising Neurological:Denies numbness, tingling or new weaknesses Behavioral/Psych: Mood is stable, no new changes  All other systems were reviewed with the patient and are negative.  PHYSICAL EXAMINATION: ECOG PERFORMANCE STATUS: 1 - Symptomatic but completely ambulatory  Vitals:   12/10/16 1442  BP: (!) 107/59  Pulse: 71  Resp: 20  Temp: 98.8 F (37.1 C)   Filed Weights   12/10/16 1442  Weight: 149 lb 1.6 oz (67.6 kg)    GENERAL:alert, no distress and comfortable SKIN: skin color, texture, turgor are normal, no rashes or significant lesions EYES: normal, conjunctiva are pink and non-injected, sclera clear OROPHARYNX:no exudate, no erythema and lips, buccal mucosa, and tongue normal  NECK: supple, thyroid normal size, non-tender, without nodularity LYMPH:  no palpable lymphadenopathy in the cervical, axillary or inguinal LUNGS: clear to auscultation and percussion with normal breathing effort HEART: regular rate & rhythm and no murmurs and no lower extremity edema ABDOMEN:abdomen soft, non-tender and normal bowel sounds Musculoskeletal:no cyanosis of digits and no clubbing  PSYCH: alert & oriented x 3 with fluent speech NEURO: no focal motor/sensory deficits  LABORATORY DATA:  I have reviewed the data as listed CBC Latest Ref Rng & Units 11/29/2016 10/29/2016 02/29/2016  WBC 3.8 - 10.8 K/uL - 5.2 2.8(L)  Hemoglobin 13.0 - 17.0 g/dL 14.5 13.7 13.7  Hematocrit 38.5 - 50.0 % - 41.2 42.0  Platelets 140 -  400 K/uL - 219 183   CMP Latest Ref Rng & Units 10/29/2016 02/29/2016  Glucose 65 - 99 mg/dL 96 88  BUN 7 - 25 mg/dL 14 12  Creatinine 0.60 - 1.35 mg/dL 1.13 1.11  Sodium 135 - 146 mmol/L 138 139  Potassium 3.5 - 5.3 mmol/L 4.0 4.1  Chloride 98 - 110 mmol/L 102 103  CO2 20 - 31 mmol/L 27 28  Calcium 8.6 - 10.3 mg/dL 9.3 9.2  Total Protein 6.1 - 8.1 g/dL 7.3 7.3  Total Bilirubin 0.2 - 1.2 mg/dL 0.5 0.4  Alkaline Phos 40 - 115 U/L 66 66  AST 10 - 40 U/L 20 18  ALT 9 - 46 U/L 25 22   PATHOLOGY REPORT  Diagnosis 11/29/2016 Anus, biopsy, anal canal mass - INVASIVE MODERATELY DIFFERENTIATED SQUAMOUS CELL CARCINOMA ASSOCIATED WITH SQUAMOUS CELL CARCINOMA IN SITU.    RADIOGRAPHIC STUDIES: I have personally reviewed the radiological images as listed and agreed with the findings in the report. Ct Chest W Contrast  Result Date: 12/06/2016 CLINICAL DATA:  New diagnosis of rectal cancer. EXAM: CT CHEST, ABDOMEN, AND PELVIS WITH CONTRAST TECHNIQUE: Multidetector CT imaging of the chest, abdomen and pelvis was performed following the standard protocol during bolus administration of intravenous contrast. CONTRAST:  181mL ISOVUE-300 IOPAMIDOL (ISOVUE-300) INJECTION 61% COMPARISON:  None. FINDINGS: CT CHEST FINDINGS Cardiovascular: The heart size is normal. No pericardial effusion. No thoracic aortic aneurysm. No large central pulmonary embolus. Mediastinum/Nodes: No mediastinal lymphadenopathy. There is no hilar lymphadenopathy. The esophagus has normal imaging features. Calcified nodal tissue identified in the right hilum. There is no axillary lymphadenopathy. Lungs/Pleura: Paraseptal emphysema noted bilaterally. 10 mm posterior right upper lobe pulmonary nodule is seen on image 38 series 6. This nodule has central stippled calcification. 5 mm posterior right lower lobe ground-glass nodule identified on image 62. No suspicious pulmonary nodule or mass in the left lung. There is no evidence for pneumonia,  pulmonary edema, or pleural effusion on today's exam. Musculoskeletal: Bone windows reveal no worrisome lytic or sclerotic osseous lesions. CT ABDOMEN PELVIS FINDINGS Hepatobiliary: Scattered tiny hypodensities in the liver parenchyma are too small to characterize but likely represent tiny cysts. Gallbladder is nondistended. Mild prominence of the intra and extrahepatic bile ducts is evident. Pancreas: No focal mass lesion. No dilatation of the main duct. No intraparenchymal cyst. No peripancreatic edema. Spleen: No splenomegaly. No focal  mass lesion. Adrenals/Urinary Tract: No adrenal nodule or mass. No enhancing lesion in either kidney. No evidence for hydroureter. The urinary bladder appears normal for the degree of distention. Stomach/Bowel: Stomach is nondistended. No gastric wall thickening. No evidence of outlet obstruction. Duodenum is normally positioned as is the ligament of Treitz. No small bowel wall thickening. No small bowel dilatation. The terminal ileum is normal. The appendix is not visualized, but there is no edema or inflammation in the region of the cecum. Moderate colonic stool volume evident. Apparent wall thickening in the anal rectal junction although this area is difficult to assess by CT. Vascular/Lymphatic: No abdominal aortic aneurysm. No abdominal aortic atherosclerotic calcification. There is no gastrohepatic or hepatoduodenal ligament lymphadenopathy. No intraperitoneal or retroperitoneal lymphadenopathy. 2.3 x 1.6 cm lymph node is identified along the left internal iliac chain (see image 188 series 2). 10 x 13 mm lymph node is seen just cranial to this on image 182. Reproductive: Prostate gland mildly enlarged. Asymmetry of the seminal vesicles noted with the right seminal vesicle slightly larger than the left. This is indeterminate. Other: No intraperitoneal free fluid. Musculoskeletal: Bone windows reveal no worrisome lytic or sclerotic osseous lesions. IMPRESSION: 1. Apparent wall  thickening in the distal rectum/anus. This is associated with 2 posterior left pelvic sidewall lymph nodes, highly suspicious for metastatic disease. 2. 10 mm right upper lobe pulmonary nodule has central stippled calcification and associated calcified nodal tissue in the right hilum. While the nodule is probably granulomatous, close attention on follow-up is recommended to ensure stability. 3. Probable tiny hepatic cysts. Electronically Signed   By: Misty Stanley M.D.   On: 12/06/2016 13:46   Ct Abdomen Pelvis W Contrast  Result Date: 12/06/2016 CLINICAL DATA:  New diagnosis of rectal cancer. EXAM: CT CHEST, ABDOMEN, AND PELVIS WITH CONTRAST TECHNIQUE: Multidetector CT imaging of the chest, abdomen and pelvis was performed following the standard protocol during bolus administration of intravenous contrast. CONTRAST:  165mL ISOVUE-300 IOPAMIDOL (ISOVUE-300) INJECTION 61% COMPARISON:  None. FINDINGS: CT CHEST FINDINGS Cardiovascular: The heart size is normal. No pericardial effusion. No thoracic aortic aneurysm. No large central pulmonary embolus. Mediastinum/Nodes: No mediastinal lymphadenopathy. There is no hilar lymphadenopathy. The esophagus has normal imaging features. Calcified nodal tissue identified in the right hilum. There is no axillary lymphadenopathy. Lungs/Pleura: Paraseptal emphysema noted bilaterally. 10 mm posterior right upper lobe pulmonary nodule is seen on image 38 series 6. This nodule has central stippled calcification. 5 mm posterior right lower lobe ground-glass nodule identified on image 62. No suspicious pulmonary nodule or mass in the left lung. There is no evidence for pneumonia, pulmonary edema, or pleural effusion on today's exam. Musculoskeletal: Bone windows reveal no worrisome lytic or sclerotic osseous lesions. CT ABDOMEN PELVIS FINDINGS Hepatobiliary: Scattered tiny hypodensities in the liver parenchyma are too small to characterize but likely represent tiny cysts. Gallbladder  is nondistended. Mild prominence of the intra and extrahepatic bile ducts is evident. Pancreas: No focal mass lesion. No dilatation of the main duct. No intraparenchymal cyst. No peripancreatic edema. Spleen: No splenomegaly. No focal mass lesion. Adrenals/Urinary Tract: No adrenal nodule or mass. No enhancing lesion in either kidney. No evidence for hydroureter. The urinary bladder appears normal for the degree of distention. Stomach/Bowel: Stomach is nondistended. No gastric wall thickening. No evidence of outlet obstruction. Duodenum is normally positioned as is the ligament of Treitz. No small bowel wall thickening. No small bowel dilatation. The terminal ileum is normal. The appendix is not visualized, but there is  no edema or inflammation in the region of the cecum. Moderate colonic stool volume evident. Apparent wall thickening in the anal rectal junction although this area is difficult to assess by CT. Vascular/Lymphatic: No abdominal aortic aneurysm. No abdominal aortic atherosclerotic calcification. There is no gastrohepatic or hepatoduodenal ligament lymphadenopathy. No intraperitoneal or retroperitoneal lymphadenopathy. 2.3 x 1.6 cm lymph node is identified along the left internal iliac chain (see image 188 series 2). 10 x 13 mm lymph node is seen just cranial to this on image 182. Reproductive: Prostate gland mildly enlarged. Asymmetry of the seminal vesicles noted with the right seminal vesicle slightly larger than the left. This is indeterminate. Other: No intraperitoneal free fluid. Musculoskeletal: Bone windows reveal no worrisome lytic or sclerotic osseous lesions. IMPRESSION: 1. Apparent wall thickening in the distal rectum/anus. This is associated with 2 posterior left pelvic sidewall lymph nodes, highly suspicious for metastatic disease. 2. 10 mm right upper lobe pulmonary nodule has central stippled calcification and associated calcified nodal tissue in the right hilum. While the nodule is  probably granulomatous, close attention on follow-up is recommended to ensure stability. 3. Probable tiny hepatic cysts. Electronically Signed   By: Misty Stanley M.D.   On: 12/06/2016 13:46    ASSESSMENT & PLAN:  46 year old African-American male, with history of HIV and AIDS, on genvoya since 03/2016,presents with rectal bleeding and pain.  1.  Anal cancer, invasive squamous cell carcinoma,cT2N1aM0, stage IIIA -I reviewed his CT scan findings and anal mass biopsy results in details with patient and his sister. -His CT scan showed significantly intact left internal iliac lymph node, highly suspicious for metastasis. No other distant metastasis on CT scan -He is scheduled for a PET scan for further evaluation and staging. -He has locally advanced anal cancer, I discussed the standard therapy with concurrent chemotherapy and radiation. We usually preserve surgery for residual cancer after chemotherapy and radiation, or recurrent disease. 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 -I discussed the standard chemotherapy regiment with mitomycin on Day 1 and 5-FU infusion day 1-5 (96 hours) --Chemotherapy consent: Side effects including but does not not limited to, fatigue, nausea, vomiting, diarrhea, hair loss, neuropathy, fluid retention, renal and kidney dysfunction, neutropenic fever, needed for blood transfusion, bleeding, were discussed with patient in great detail. He agrees to proceed. -The goal of therapy is curative -I will repeat his CD4 counts, if it still low, I will consider slight chemo dose reduction and prophylactic antibiotics. I will discuss with Dr. Johnnye Harrison also -We tentatively plan to start his chemotherapy and radiation next Monday, January 15.  2. Rectal bleeding and pain  -will check his lab  including iron study  -He takes oxycodone as needed for his rectal pain, overall controlled.  3. HIV/AIDS -His treatment was switched to genvoya in 05/2016, his CD4 has improved since then, but overall still low, will repeat today -continue HIV therapy when on chemo    4. Social support  -His sister is involved with his care -I will social worker will meet the today to help him to arrange this transportation etc.  Plan -PICC line placement by IR on January 15 -Chemotherapy class this week -Lab today -I plan to see him back on January 15 before his first dose chemotherapy, and decide if we need reduce his chemotherapy dose   Orders Placed  This Encounter  Procedures  . IR Fluoro Guide CV Line Left    Single lumen Power PICC line on 12/16/16 in am please    Standing Status:   Future    Standing Expiration Date:   02/07/2018    Order Specific Question:   Reason for exam:    Answer:   Chemotherapy administration    Order Specific Question:   Preferred Imaging Location?    Answer:   [facility]  . CBC with Differential    Standing Status:   Standing    Number of Occurrences:   40    Standing Expiration Date:   12/10/2021  . Comprehensive metabolic panel    Standing Status:   Standing    Number of Occurrences:   40    Standing Expiration Date:   12/10/2021  . CD4 count    Standing Status:   Future    Standing Expiration Date:   12/10/2017    All questions were answered. The patient knows to call the clinic with any problems, questions or concerns. I spent 55 minutes counseling the patient face to face. The total time spent in the appointment was 60 minutes and more than 50% was on counseling.     Juan Merle, MD 12/10/2016

## 2016-12-10 NOTE — Patient Instructions (Signed)
Care Plan Summary- 12/10/2016 Name:         DOB:  10/03/1971 Your Medical Team: Medical Oncologist: Dr. Truitt Merle  Radiation Oncologist: Dr. Kyung Rudd  Surgeon:  Dr. Leighton Ruff  Type of Cancer: Squamous Cell Cancer of Anus Stage/Grade: Stage III *Exact staging of your cancer is based on size of the tumor, depth of invasion, involvement of lymph nodes or not, and whether or not the cancer has spread beyond the primary site   Recommendations: Based on information available as of today's consult. Recommendations may change depending on the results of further tests or exams. 1) Radiation therapy M-F for 6 weeks (30 fractions)-Simulation 12/11/16 at 1 pm 2) Chemotherapy with Mitomycin-C/5 FU pump on week 1 and week 5 of radiation 3) Complete staging with PET scan on 12/17/16 at 0700 (as scheduled)-will try to move to this week Next Steps: 1) Chemotherapy education class this week and baseline labs today 2) Interventional radiology to place PICC line for chemotherapy infusion on 12/16/16 3) Meet with social worker today regarding POA and transportation Questions? Merceda Elks, RN, BSN at [phone]. Manuela Schwartz is your Oncology Nurse Navigator and is available to assist you while you're receiving your medical care at [facility].

## 2016-12-11 ENCOUNTER — Other Ambulatory Visit: Payer: Medicare Other

## 2016-12-11 ENCOUNTER — Encounter: Payer: Self-pay | Admitting: *Deleted

## 2016-12-11 ENCOUNTER — Ambulatory Visit
Admission: RE | Admit: 2016-12-11 | Discharge: 2016-12-11 | Disposition: A | Discharge status: Home / Self Care | Payer: Medicare Other | Source: Ambulatory Visit | Attending: Radiation Oncology | Admitting: Radiation Oncology

## 2016-12-11 ENCOUNTER — Telehealth: Payer: Self-pay | Admitting: Hematology

## 2016-12-11 ENCOUNTER — Other Ambulatory Visit (HOSPITAL_BASED_OUTPATIENT_CLINIC_OR_DEPARTMENT_OTHER): Payer: Medicare Other

## 2016-12-11 ENCOUNTER — Other Ambulatory Visit (HOSPITAL_COMMUNITY)
Admission: RE | Admit: 2016-12-11 | Discharge: 2016-12-11 | Disposition: A | Discharge status: Home / Self Care | Payer: Medicare Other | Source: Ambulatory Visit | Attending: Hematology | Admitting: Hematology

## 2016-12-11 DIAGNOSIS — C210 Malignant neoplasm of anus, unspecified: Secondary | ICD-10-CM

## 2016-12-11 DIAGNOSIS — F17210 Nicotine dependence, cigarettes, uncomplicated: Secondary | ICD-10-CM | POA: Diagnosis not present

## 2016-12-11 DIAGNOSIS — B20 Human immunodeficiency virus [HIV] disease: Secondary | ICD-10-CM | POA: Insufficient documentation

## 2016-12-11 DIAGNOSIS — Z510 Encounter for antineoplastic radiation therapy: Secondary | ICD-10-CM | POA: Diagnosis not present

## 2016-12-11 DIAGNOSIS — K625 Hemorrhage of anus and rectum: Secondary | ICD-10-CM | POA: Diagnosis not present

## 2016-12-11 DIAGNOSIS — C211 Malignant neoplasm of anal canal: Secondary | ICD-10-CM

## 2016-12-11 LAB — IRON AND TIBC
%SAT: 13 % — ABNORMAL LOW (ref 20–55)
IRON: 40 ug/dL — AB (ref 42–163)
TIBC: 296 ug/dL (ref 202–409)
UIBC: 256 ug/dL (ref 117–376)

## 2016-12-11 LAB — CBC WITH DIFFERENTIAL/PLATELET
BASO%: 0.7 % (ref 0.0–2.0)
BASOS ABS: 0.1 10*3/uL (ref 0.0–0.1)
EOS ABS: 0.5 10*3/uL (ref 0.0–0.5)
EOS%: 6 % (ref 0.0–7.0)
HCT: 42.5 % (ref 38.4–49.9)
HEMOGLOBIN: 14.2 g/dL (ref 13.0–17.1)
LYMPH%: 23.4 % (ref 14.0–49.0)
MCH: 29.6 pg (ref 27.2–33.4)
MCHC: 33.5 g/dL (ref 32.0–36.0)
MCV: 88.1 fL (ref 79.3–98.0)
MONO#: 0.8 10*3/uL (ref 0.1–0.9)
MONO%: 10.1 % (ref 0.0–14.0)
NEUT#: 4.8 10*3/uL (ref 1.5–6.5)
NEUT%: 59.8 % (ref 39.0–75.0)
PLATELETS: 254 10*3/uL (ref 140–400)
RBC: 4.82 10*6/uL (ref 4.20–5.82)
RDW: 14.3 % (ref 11.0–14.6)
WBC: 8 10*3/uL (ref 4.0–10.3)
lymph#: 1.9 10*3/uL (ref 0.9–3.3)

## 2016-12-11 LAB — COMPREHENSIVE METABOLIC PANEL
ALBUMIN: 3.6 g/dL (ref 3.5–5.0)
ALK PHOS: 90 U/L (ref 40–150)
ALT: 24 U/L (ref 0–55)
ANION GAP: 8 meq/L (ref 3–11)
AST: 17 U/L (ref 5–34)
BUN: 13 mg/dL (ref 7.0–26.0)
CO2: 29 mEq/L (ref 22–29)
Calcium: 9.8 mg/dL (ref 8.4–10.4)
Chloride: 101 mEq/L (ref 98–109)
Creatinine: 1.1 mg/dL (ref 0.7–1.3)
GLUCOSE: 96 mg/dL (ref 70–140)
POTASSIUM: 3.9 meq/L (ref 3.5–5.1)
SODIUM: 138 meq/L (ref 136–145)
Total Bilirubin: 0.6 mg/dL (ref 0.20–1.20)
Total Protein: 7.9 g/dL (ref 6.4–8.3)

## 2016-12-11 LAB — FERRITIN: Ferritin: 46 ng/ml (ref 22–316)

## 2016-12-11 MED ORDER — PROCHLORPERAZINE MALEATE 10 MG PO TABS: 10.0000 mg | ORAL_TABLET | Freq: Four times a day (QID) | ORAL | 1 refills | Status: DC | PRN

## 2016-12-11 MED ORDER — ONDANSETRON HCL 8 MG PO TABS: 8.0000 mg | ORAL_TABLET | Freq: Two times a day (BID) | ORAL | 1 refills | Status: DC | PRN

## 2016-12-11 NOTE — Progress Notes (Signed)
START ON PATHWAY REGIMEN - Anal Carcinoma  ANLOS01: 5-Fluorouracil 1,000 mg/m2/day CIV D1-4, 29-32 + Mitomycin 10 mg/m2 D1, 29 + Concurrent Radiation Therapy   Chemotherapy concurrent with RT:     Mitomycin (Mutamycin(R)) 10 mg/m2 in 50 mL NS IV over 15 minutes on days 1 and 29 (maximum dose = 20 mg per course; may need to adjust D29 dose based on tolerance) Dose Mod: None     5-Fluorouracil 1,000 mg/m2/day in _____mL NS by continuous IV infusion over 24 hours on days 1, 2, 3, 4 and 29, 30, 31, 32 (Note: total dose per course = 4,000 mg/m2 over 96 hours) Dose Mod: None Additional Orders: Chemotherapy is given concurrently with radiation therapy.  [place]; [id number]. Duayne Cal, et al. JAMA 2008; 299(16):1914-21.  **Always confirm dose/schedule in your pharmacy ordering system**    Patient Characteristics: Anal and Anal Margin Tumors, Newly Diagnosed - Locoregional Disease Not Amenable to Local Excision AJCC T Stage: 2 AJCC N Stage: 1 AJCC M Stage: 0 AJCC Stage Grouping: IIIA Current Disease Status: Newly Diagnosed - Locoregional Disease Not Amendable to Local Excision  Intent of Therapy: Curative Intent, Discussed with Patient

## 2016-12-11 NOTE — Telephone Encounter (Signed)
Appointments scheduled per 1/9 LOS. Patient given AVS report and calendars with future scheduled appointments. °

## 2016-12-11 NOTE — Addendum Note (Signed)
Addended by: Truitt Merle on: 12/11/2016 10:19 PM   Modules accepted: Orders

## 2016-12-11 NOTE — Progress Notes (Signed)
Oncology Nurse Navigator Documentation  Oncology Nurse Navigator Flowsheets 12/11/2016  Navigator Location [facility]  Referral date to RadOnc/MedOnc -  Navigator Encounter Type Other--chemo class  Abnormal Finding Date -  Confirmed Diagnosis Date -  Patient Visit Type Other--chemo class  Treatment Phase Pre-Tx/Tx Discussion  Barriers/Navigation Needs Coordination of Care--PET  Education -  Interventions Coordination of Care--able to get his PET scan moved to 12/13/16 at 0800/0830 and NPO after midnight night before. Patient notified.  Referrals -  Coordination of Care Radiology  Education Method Verbal;Written  Support Groups/Services -  Acuity -  Time Spent with Patient 15

## 2016-12-12 ENCOUNTER — Encounter: Payer: Self-pay | Admitting: *Deleted

## 2016-12-12 LAB — T-HELPER CELLS (CD4) COUNT (NOT AT ARMC)
CD4 % Helper T Cell: 7 % — ABNORMAL LOW (ref 33–55)
CD4 T CELL ABS: 180 /uL — AB (ref 400–2700)

## 2016-12-12 MED FILL — PROCHLORPERAZINE 10 MG TAB: 10 | 7 days supply | Qty: 30 | Fill #0

## 2016-12-12 MED FILL — ONDANSETRON HCL 8 MG TABLET: 8 | 15 days supply | Qty: 30 | Fill #0

## 2016-12-12 NOTE — Progress Notes (Signed)
[facility] Psychosocial Distress Screening Clinical Social Work  Clinical Social Work was referred by distress screening protocol.  The patient scored a 8 on the Psychosocial Distress Thermometer which indicates severe distress. Clinical Social Worker met with pt on 12/10/16 and addressed these concerns as shown in the note below.   ONCBCN DISTRESS SCREENING 12/05/2016  Screening Type Initial Screening  Distress experienced in past week (1-10) 8  Practical problem type Transportation  Emotional problem type Nervousness/Anxiety  Physical Problem type Sleep/insomnia;Skin dry/itchy  Physician notified of physical symptoms Yes  Referral to clinical social work Yes    [facility] was referred by radiation oncology and nurse navigator for assessment of psychosocial needs, specifically transportation concerns.  Clinical Social Worker met with patient and patient's sister, Juan Harrison, to offer support and assess for needs.  CSW and patient completed application for [employer] transportation.  Juan Harrison reported no other concerns at this time.  The patient stated he lives alone and has a good support system- identified a few close friends and his sister whom lives in [place].  Patient was agreeable to completing healthcare advance directives during today's visit. The patient designated sister, Juan Harrison, as their primary healthcare agent and no secondary agent.  Patient also completed healthcare living will.    Clinical Social Worker notarized documents and made copies for patient/family. Clinical Social Worker will send documents to medical records to be scanned into patient's chart and encouraged patient to share a copy with Dr. Algis Downs at his next appointment. Clinical Social Worker encouraged patient/family to contact with any additional questions or concerns.  Juan Harrison, MSW, LCSW Clinical Social Worker [facility] [phone]                                                              Clinical Social Worker follow up needed: No.  If yes, follow up plan:  Juan Harrison, [place]  [facility] Phone: [phone] Fax: [phone]

## 2016-12-13 ENCOUNTER — Telehealth: Payer: Self-pay | Admitting: *Deleted

## 2016-12-13 ENCOUNTER — Encounter (HOSPITAL_COMMUNITY)
Admission: RE | Admit: 2016-12-13 | Discharge: 2016-12-13 | Disposition: A | Discharge status: Home / Self Care | Payer: Medicare Other | Source: Ambulatory Visit | Attending: Radiation Oncology | Admitting: Radiation Oncology

## 2016-12-13 DIAGNOSIS — Z510 Encounter for antineoplastic radiation therapy: Secondary | ICD-10-CM | POA: Diagnosis not present

## 2016-12-13 DIAGNOSIS — C210 Malignant neoplasm of anus, unspecified: Secondary | ICD-10-CM | POA: Diagnosis not present

## 2016-12-13 DIAGNOSIS — B20 Human immunodeficiency virus [HIV] disease: Secondary | ICD-10-CM | POA: Diagnosis not present

## 2016-12-13 DIAGNOSIS — F17210 Nicotine dependence, cigarettes, uncomplicated: Secondary | ICD-10-CM | POA: Diagnosis not present

## 2016-12-13 LAB — GLUCOSE, CAPILLARY: Glucose-Capillary: 85 mg/dL (ref 65–99)

## 2016-12-13 MED ORDER — FLUDEOXYGLUCOSE F - 18 (FDG) INJECTION
7.5700 | Freq: Once | INTRAVENOUS | Status: AC | PRN
  Administered 2016-12-13: 7.57 via INTRAVENOUS

## 2016-12-13 NOTE — Progress Notes (Signed)
  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 This complex treatment device will be used on a daily basis during the patient's treatment.   Diagnosis: Anal cancer   NARRATIVE: The patient was brought to the [facility]. Identity was confirmed. All relevant records and images related to the planned course of therapy were reviewed. Then, the patient was positioned in a stable reproducible clinical set-up for radiation therapy using a customized vac lock bag. Skin markings were placed. The CT images were loaded into the planning software where the target and avoidance structures were contoured.The radiation prescription was entered and confirmed.   The patient will receive 54 Gy in 30 fractions to the high-dose target region.  Daily image guidance is ordered, and this will be used on a daily basis. This is necessary to ensure accurate and precise localization of the target in addition to accurate alignment of the normal tissue structures in this region. This is particularly important given the possible motion of the high-dose target.  Treatment planning then occurred.   I have requested : Intensity Modulated Radiotherapy (IMRT) is medically necessary for this case for the following reason: Dose homogeneity; the target is in close proximity to critical normal structures, including the femoral heads, bladder, and small bowel. IMRT is thus medically to appropriately treat the patient.   Special treatment procedure  The patient will receive chemotherapy during the course of radiation treatment. The patient may experience  increased or overlapping toxicity due to this combined-modality approach and the patient will be monitored for such problems. This may include extra lab  work as necessary. This therefore constitutes a special treatment procedure.     ________________________________  Jodelle Gross, MD, PhD

## 2016-12-13 NOTE — Telephone Encounter (Signed)
Oncology Nurse Navigator Documentation  Oncology Nurse Navigator Flowsheets 12/13/2016  Navigator Location [facility]-  Referral date to RadOnc/MedOnc -  Navigator Encounter Type Telephone  Telephone Outgoing Call;Appt Confirmation/Clarification  Abnormal Finding Date -  Confirmed Diagnosis Date -  Patient Visit Type -  Treatment Phase -  Barriers/Navigation Needs -  Education -  Interventions -Notified Juan Harrison that everything is a go for Monday. Get PICC line placed on 1/15 at 0830 and then come to [place] at 1145 to start lab and treatments after he sees Dr. Burr Medico.  Referrals -  Coordination of Care -  Education Method -  Support Groups/Services -  Acuity -  Time Spent with Patient -

## 2016-12-16 ENCOUNTER — Other Ambulatory Visit: Payer: Self-pay | Admitting: Hematology

## 2016-12-16 ENCOUNTER — Ambulatory Visit (HOSPITAL_BASED_OUTPATIENT_CLINIC_OR_DEPARTMENT_OTHER): Payer: Medicare Other

## 2016-12-16 ENCOUNTER — Ambulatory Visit
Admission: RE | Admit: 2016-12-16 | Discharge: 2016-12-16 | Disposition: A | Discharge status: Home / Self Care | Payer: Medicare Other | Source: Ambulatory Visit | Attending: Radiation Oncology | Admitting: Radiation Oncology

## 2016-12-16 ENCOUNTER — Encounter: Payer: Self-pay | Admitting: *Deleted

## 2016-12-16 ENCOUNTER — Ambulatory Visit: Payer: Medicare Other

## 2016-12-16 ENCOUNTER — Encounter (HOSPITAL_COMMUNITY): Payer: Self-pay | Admitting: Interventional Radiology

## 2016-12-16 ENCOUNTER — Other Ambulatory Visit (HOSPITAL_BASED_OUTPATIENT_CLINIC_OR_DEPARTMENT_OTHER): Payer: Medicare Other

## 2016-12-16 ENCOUNTER — Ambulatory Visit (HOSPITAL_COMMUNITY)
Admission: RE | Admit: 2016-12-16 | Discharge: 2016-12-16 | Disposition: A | Discharge status: Home / Self Care | Payer: Medicare Other | Source: Ambulatory Visit | Attending: Hematology | Admitting: Hematology

## 2016-12-16 ENCOUNTER — Ambulatory Visit (HOSPITAL_BASED_OUTPATIENT_CLINIC_OR_DEPARTMENT_OTHER): Payer: Medicare Other | Admitting: Hematology

## 2016-12-16 ENCOUNTER — Encounter: Payer: Self-pay | Admitting: Hematology

## 2016-12-16 VITALS — BP 111/72 | HR 85 | Temp 97.6°F | Resp 18 | Ht 75.0 in | Wt 148.2 lb

## 2016-12-16 DIAGNOSIS — C210 Malignant neoplasm of anus, unspecified: Secondary | ICD-10-CM | POA: Diagnosis not present

## 2016-12-16 DIAGNOSIS — B20 Human immunodeficiency virus [HIV] disease: Secondary | ICD-10-CM

## 2016-12-16 DIAGNOSIS — Z510 Encounter for antineoplastic radiation therapy: Secondary | ICD-10-CM | POA: Diagnosis not present

## 2016-12-16 DIAGNOSIS — K625 Hemorrhage of anus and rectum: Secondary | ICD-10-CM

## 2016-12-16 DIAGNOSIS — Z5111 Encounter for antineoplastic chemotherapy: Secondary | ICD-10-CM | POA: Diagnosis not present

## 2016-12-16 DIAGNOSIS — Z9221 Personal history of antineoplastic chemotherapy: Secondary | ICD-10-CM | POA: Insufficient documentation

## 2016-12-16 DIAGNOSIS — F17210 Nicotine dependence, cigarettes, uncomplicated: Secondary | ICD-10-CM | POA: Diagnosis not present

## 2016-12-16 HISTORY — PX: IR GENERIC HISTORICAL: IMG1180011

## 2016-12-16 LAB — CBC WITH DIFFERENTIAL/PLATELET
BASO%: 0.3 % (ref 0.0–2.0)
Basophils Absolute: 0 10*3/uL (ref 0.0–0.1)
EOS%: 5.3 % (ref 0.0–7.0)
Eosinophils Absolute: 0.4 10*3/uL (ref 0.0–0.5)
HEMATOCRIT: 42.4 % (ref 38.4–49.9)
HGB: 14.3 g/dL (ref 13.0–17.1)
LYMPH%: 24 % (ref 14.0–49.0)
MCH: 29.7 pg (ref 27.2–33.4)
MCHC: 33.6 g/dL (ref 32.0–36.0)
MCV: 88.4 fL (ref 79.3–98.0)
MONO#: 0.9 10*3/uL (ref 0.1–0.9)
MONO%: 11.7 % (ref 0.0–14.0)
NEUT#: 4.7 10*3/uL (ref 1.5–6.5)
NEUT%: 58.7 % (ref 39.0–75.0)
Platelets: 284 10*3/uL (ref 140–400)
RBC: 4.8 10*6/uL (ref 4.20–5.82)
RDW: 14.1 % (ref 11.0–14.6)
WBC: 8 10*3/uL (ref 4.0–10.3)
lymph#: 1.9 10*3/uL (ref 0.9–3.3)

## 2016-12-16 LAB — COMPREHENSIVE METABOLIC PANEL
ALT: 24 U/L (ref 0–55)
AST: 17 U/L (ref 5–34)
Albumin: 3.6 g/dL (ref 3.5–5.0)
Alkaline Phosphatase: 97 U/L (ref 40–150)
Anion Gap: 9 mEq/L (ref 3–11)
BUN: 11 mg/dL (ref 7.0–26.0)
CALCIUM: 9.8 mg/dL (ref 8.4–10.4)
CHLORIDE: 103 meq/L (ref 98–109)
CO2: 26 meq/L (ref 22–29)
CREATININE: 1 mg/dL (ref 0.7–1.3)
EGFR: >90 mL/min/{1.73_m2} (ref >90)
GLUCOSE: 102 mg/dL (ref 70–140)
Potassium: 3.9 mEq/L (ref 3.5–5.1)
SODIUM: 137 meq/L (ref 136–145)
Total Bilirubin: 0.62 mg/dL (ref 0.20–1.20)
Total Protein: 8.3 g/dL (ref 6.4–8.3)

## 2016-12-16 MED ORDER — PROCHLORPERAZINE MALEATE 10 MG PO TABS
10.0000 mg | ORAL_TABLET | Freq: Once | ORAL | Status: AC
  Administered 2016-12-16: 10 mg via ORAL

## 2016-12-16 MED ORDER — SODIUM CHLORIDE 0.9% FLUSH
10.0000 mL | INTRAVENOUS | Status: DC | PRN
  Filled 2016-12-16: qty 10

## 2016-12-16 MED ORDER — PROCHLORPERAZINE MALEATE 10 MG PO TABS
ORAL_TABLET | ORAL | Status: AC
  Filled 2016-12-16: qty 1

## 2016-12-16 MED ORDER — OXYCODONE HCL 5 MG PO TABS: 5.0000 mg | ORAL_TABLET | Freq: Four times a day (QID) | ORAL | 0 refills | Status: DC | PRN

## 2016-12-16 MED ORDER — MORPHINE SULFATE 4 MG/ML IJ SOLN
4.0000 mg | Freq: Once | INTRAMUSCULAR | Status: AC
  Administered 2016-12-16: 4 mg via INTRAVENOUS
  Filled 2016-12-16: qty 1

## 2016-12-16 MED ORDER — MORPHINE SULFATE ER 15 MG PO TBCR: 15.0000 mg | EXTENDED_RELEASE_TABLET | Freq: Two times a day (BID) | ORAL | 0 refills | Status: DC

## 2016-12-16 MED ORDER — SODIUM CHLORIDE 0.9 % IV SOLN
Freq: Once | INTRAVENOUS | Status: AC
  Administered 2016-12-16: 14:00:00 via INTRAVENOUS

## 2016-12-16 MED ORDER — SODIUM CHLORIDE 0.9 % IV SOLN
1000.0000 mg/m2/d | INTRAVENOUS | Status: DC
  Administered 2016-12-16: 7550 mg via INTRAVENOUS
  Filled 2016-12-16: qty 151

## 2016-12-16 MED ORDER — MITOMYCIN CHEMO IV INJECTION 20 MG
10.0000 mg/m2 | Freq: Once | INTRAVENOUS | Status: AC
  Administered 2016-12-16: 19 mg via INTRAVENOUS
  Filled 2016-12-16: qty 38

## 2016-12-16 MED ORDER — HEPARIN SOD (PORK) LOCK FLUSH 100 UNIT/ML IV SOLN
500.0000 [IU] | Freq: Once | INTRAVENOUS | Status: DC | PRN
  Filled 2016-12-16: qty 5

## 2016-12-16 MED ORDER — MORPHINE SULFATE (PF) 4 MG/ML IV SOLN
INTRAVENOUS | Status: AC
  Filled 2016-12-16: qty 1

## 2016-12-16 MED ORDER — LIDOCAINE HCL 1 % IJ SOLN
INTRAMUSCULAR | Status: AC
  Filled 2016-12-16: qty 20

## 2016-12-16 MED ORDER — LIDOCAINE HCL 1 % IJ SOLN
INTRAMUSCULAR | Status: DC | PRN
  Administered 2016-12-16: 5 mL

## 2016-12-16 NOTE — Progress Notes (Signed)
First-time mitomycin and 5FU pump. Pt was educated on the procedures in the infusion room, as well as expected and adverse chemotherapy side effects. Special education taught based on mitomycin and 5FU. Pt verbalized understanding.   All questions answered before pt d/c. Asymptomatic upon d/c and AVS printed. Spill kit(s) provided for use at home and in vehicle in the event of a chemotherapy spill. Explained use to pt and pt verbalized understanding.   Pt to return on Friday for pump d/c. In-basket placed for f/u phone call and chemotherapy consent signed. 

## 2016-12-16 NOTE — Progress Notes (Signed)
Patient education  Done,  radiation therapy and you book, my business card and sitz bath given to patient, discussed ways to manage side effects, nausea, vomiting, diarrhea, loss pubic hair, skin irritation, fatigue, bladder changes, blader spasms, dysuria, nocturia,  May need to eat 5-6 smaller meals, low fiber diet for diarrhea,imodium, baby wipe, tucks,brat diet, clear liquids, stay away from fried greasy foods,spicy foods, gave instructions of use of sitz bath,warm water only and use prn, warm bath or showers, high calories anad increase protein in diet, teach back given, sees MD weekly and prn 9:51 AM

## 2016-12-16 NOTE — Progress Notes (Signed)
[place]  [phone] Fax:([phone]  Clinic follow up Note   Patient Care Team: No Pcp Per Patient as PCP - General (General Practice) Juan [facility] as Consulting Physician (Infectious Diseases) Juan [facility] as Consulting Physician (General Surgery) Juan [facility] as Consulting Physician (Radiation Oncology) 12/16/2016  CHIEF COMPLAINTS:  Follow up anal cancer  Oncology History   Anal cancer ([place])   Staging form: Anus, AJCC 8th Edition   - Clinical: Stage IIIA (cT2, cN1a, cM0) - Signed by Juan [place] on 12/10/2016      Anal cancer ([place])   11/29/2016 Initial Biopsy    Anal canal mass biopsy showed 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   I then placed a Hill-Ferguson anoscope into the anal canal and evaluated this completely.  The mass was noted with an area of central necrosis invading into the sphincter complex.  The edges of the ulcer were trimmed away using scissors.  This was sent to pathology for further examination       12/06/2016 Imaging    CT chest, abdomen and pelvis with contrast showed apparent wall thickening in the distal rectum and anus, this is associated with 2 posterior left pelvic sidewall nodes, highly suspicious for metastatic disease. 10 mm right upper lobe pulmonary nodule has essential stippled calcification and associated calcified nodule tissue in the right hilum, probably granuloma. No other distant metastasis.       12/10/2016 Initial Diagnosis    Anal cancer ([place])      HISTORY OF PRESENTING ILLNESS (12/11/2015):  Juan [place] 46 y.o. male with past medical history of HIV, is here because of His recent  diagnosed anal cancer. He is accompanied by his sister to the clinic today.  He has had bloody BM and rectal pain for one month, his rectal bleeding is mild, mixed with stool, no blood clots. He rates his rectal pain at 7-8/10, especially with bowel movement. he takes oxycodone 1-2/day.   Patient was seen by colorectal surgeon Juan [place], could not tolerate rectal exam and anoscope in office, was brought to the [facility] for the above exam, which showed " There was an obvious anal mass noted in the left posterior lateral anal canal.  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   I then placed a Hill-Ferguson anoscope into the anal canal and evaluated this completely.  The mass was noted with an area of central necrosis invading into the sphincter complex.  The edges of the ulcer were trimmed away using scissors.  This was sent to pathology for further examination". The biopsy showed squamous cell carcinoma.  He was found to be HIV positive in 2007, has been seen Per ID Juan [place], not very compliant. He previously on  DRVr/TRV/EPZ bactrim, changed to genvoya 05-2016.  His last CD4 was 110 on 10/29/2016.   He is single, lives alone in a two story townhouse. He does not work, on [employer]. He has a sister Juan [place] who lives in [place]. He has some friends, only able to help if he needs.  CURRENT THERAPY: Concurrent radiation with chemotherapy mitomycin on day 1 and 28, 5-FU on  day 1-5, and day 28-32  INTERIM HISTORY: Juan [place] returns for follow up. He is scheduled to start chemotherapy and irradiation today. He complains of worsening rectal pain, he is not able to sit due to the pain. He sleeps on his side at night. He has been taking oxycodone 2-3 times a day, but complains of his pain is being controlled. Other new complaints.  MEDICAL HISTORY:  Past Medical History:  Diagnosis Date  . AIDS [facility]) infectious disease--- dr hatcher   dx 2007,  usCD4,   count at 110  . Anal pain   . 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 Condyloma   . Hepatitis B surface antigen positive     SURGICAL HISTORY: Past Surgical History:  Procedure Laterality Date  . IR GENERIC HISTORICAL  12/16/2016   IR US GUIDE VASC ACCESS RIGHT 12/16/2016 Juan [facility]-INTERV RAD  . IR GENERIC HISTORICAL  12/16/2016   IR FLUORO GUIDE CV LINE RIGHT 12/16/2016 Juan [facility]-INTERV RAD  . RECTAL BIOPSY N/A 11/29/2016   Procedure: BIOPSY ANAL CANAL MASS;  Surgeon: Juan [place];  Location: [facility];  Service: General;  Laterality: N/A;  . RECTAL EXAM UNDER ANESTHESIA N/A 11/29/2016   Procedure: EXAM UNDER ANESTHESIA;  Surgeon: Juan [place];  Location: [facility];  Service: General;  Laterality: N/A;  . 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 Marital status: Single    Spouse name: N/A  . Number of children: N/A  . Years of education: N/A   Occupational History  . Not on file.   Social History Main Topics  . Smoking status: Current Every Day Smoker    Packs/day: 0.25    Years: 20.00    Types: Cigarettes    Last attempt to quit: 10/13/2016  . Smokeless tobacco: Never Used     Comment: 2-3 cigarettes a day   . Alcohol use No     Comment: He used to drink alcohol on weekends for 25 years, he quit on 10/14/2016   . Drug use:     Types: Marijuana  . Sexual activity: Not on file   Other Topics Concern  . Not on file   Social History Narrative   Single, lives alone about 10 minutes from [place] in 2 story townhouse   Has one sister, Juan [place] who lives in [place] in past via friends   On disability    FAMILY HISTORY: Family History  Problem Relation Age of Onset  . COPD Mother   . Asthma Brother     ALLERGIES:  has No Known Allergies.  MEDICATIONS:  Current  Outpatient Prescriptions  Medication Sig Dispense Refill  . acetaminophen (TYLENOL) 500 MG tablet Take 500 mg by mouth every 6 (six) hours as needed.    . elvitegravir-cobicistat-emtricitabine-tenofovir (GENVOYA) 150-150-200-10 MG TABS tablet Take 1 tablet by mouth daily with breakfast. 90 tablet 3  . ibuprofen (ADVIL,MOTRIN) 200 MG tablet Take 200 mg by mouth as needed.    Marland Kitchen oxyCODONE (OXY IR/ROXICODONE) 5 MG immediate release tablet Take 1 tablet (5 mg total) by mouth every 6 (six) hours as needed. 60 tablet 0  . polyethylene glycol powder (MIRALAX) powder Take 17 g by mouth daily. 255 g 1  . valACYclovir (VALTREX) 1000 MG tablet Take 1 tablet (1,000  mg total) by mouth 2 (two) times daily. 20 tablet 0  . morphine (MS CONTIN) 15 MG 12 hr tablet Take 1 tablet (15 mg total) by mouth every 12 (twelve) hours. 30 tablet 0  . ondansetron (ZOFRAN) 8 MG tablet Take 1 tablet (8 mg total) by mouth 2 (two) times daily as needed (Nausea [place] vomiting). (Patient not taking: Reported on 12/16/2016) 30 tablet 1  . prochlorperazine (COMPAZINE) 10 MG tablet Take 1 tablet (10 mg total) by mouth every 6 (six) hours as needed (Nausea [place] vomiting). (Patient not taking: Reported on 12/16/2016) 30 tablet 1   No current facility-administered medications for this visit.    Facility-Administered Medications Ordered in Other Visits  Medication Dose Route Frequency Provider Last Rate Last Dose  . lidocaine (XYLOCAINE) 1 % (with pres) injection    PRN Juan [place]   5 mL at 12/16/16 0849    REVIEW OF SYSTEMS:   Constitutional: Denies fevers, chills [place] abnormal night sweats Eyes: Denies blurriness of vision, double vision [place] watery eyes Ears, nose, mouth, throat, and face: Denies mucositis [place] sore throat Respiratory: Denies cough, dyspnea [place] wheezes Cardiovascular: Denies palpitation, chest discomfort [place] lower extremity swelling Gastrointestinal:  Denies nausea, heartburn [place] change in bowel habits Skin: Denies  abnormal skin rashes Lymphatics: Denies new lymphadenopathy [place] easy bruising Neurological:Denies numbness, tingling [place] new weaknesses Behavioral/Psych: Mood is stable, no new changes  All other systems were reviewed with the patient and are negative.  PHYSICAL EXAMINATION: ECOG PERFORMANCE STATUS: 1 - Symptomatic but completely ambulatory  Vitals:   12/16/16 1312  BP: 111/72  Pulse: 85  Resp: 18  Temp: 97.6 F (36.4 C)   Filed Weights   12/16/16 1312  Weight: 148 lb 3.2 oz (67.2 kg)    GENERAL:alert, no distress and comfortable SKIN: skin color, texture, turgor are normal, no rashes [place] significant lesions EYES: normal, conjunctiva are pink and non-injected, sclera clear OROPHARYNX:no exudate, no erythema and lips, buccal mucosa, and tongue normal  NECK: supple, thyroid normal size, non-tender, without nodularity LYMPH:  no palpable lymphadenopathy in the cervical, axillary [place] inguinal LUNGS: clear to auscultation and percussion with normal breathing effort HEART: regular rate & rhythm and no murmurs and no lower extremity edema ABDOMEN:abdomen soft, non-tender and normal bowel sounds, rectal exam was deferred  Musculoskeletal:no cyanosis of digits and no clubbing  PSYCH: alert & oriented x 3 with fluent speech NEURO: no focal motor/sensory deficits  LABORATORY DATA:  I have reviewed the data as listed CBC Latest Ref Rng & Units 12/16/2016 12/11/2016 11/29/2016  WBC 4.0 - 10.3 10e3/uL 8.0 8.0 -  Hemoglobin 13.0 - 17.1 g/dL 14.3 14.2 14.5  Hematocrit 38.4 - 49.9 % 42.4 42.5 -  Platelets 140 - 400 10e3/uL 284 254 -   CMP Latest Ref Rng & Units 12/16/2016 12/11/2016 10/29/2016  Glucose 70 - 140 mg/dl 102 96 96  BUN 7.0 - 26.0 mg/dL 11.0 13.0 14  Creatinine 0.7 - 1.3 mg/dL 1.0 1.1 1.13  Sodium 136 - 145 mEq/L 137 138 138  Potassium 3.5 - 5.1 mEq/L 3.9 3.9 4.0  Chloride 98 - 110 mmol/L - - 102  CO2 22 - 29 mEq/L 26 29 27   Calcium 8.4 - 10.4 mg/dL 9.8 9.8 9.3  Total Protein  6.4 - 8.3 g/dL 8.3 7.9 7.3  Total Bilirubin 0.20 - 1.20 mg/dL 0.62 0.60 0.5  Alkaline Phos 40 - 150 U/L 97 90 66  AST 5 - 34 U/L 17 17 20   ALT  0 - 55 U/L 24 24 25    PATHOLOGY REPORT  Diagnosis 11/29/2016 Anus, biopsy, anal canal mass - INVASIVE MODERATELY DIFFERENTIATED SQUAMOUS CELL CARCINOMA ASSOCIATED WITH SQUAMOUS CELL CARCINOMA IN SITU.    RADIOGRAPHIC STUDIES: I have personally reviewed the radiological images as listed and agreed with the findings in the report. Ct Chest W Contrast  Result Date: 12/06/2016 CLINICAL DATA:  New diagnosis of rectal cancer. EXAM: CT CHEST, ABDOMEN, AND PELVIS WITH CONTRAST TECHNIQUE: Multidetector CT imaging of the chest, abdomen and pelvis was performed following the standard protocol during bolus administration of intravenous contrast. CONTRAST:  176mL ISOVUE-300 IOPAMIDOL (ISOVUE-300) INJECTION 61% COMPARISON:  None. FINDINGS: CT CHEST FINDINGS Cardiovascular: The heart size is normal. No pericardial effusion. No thoracic aortic aneurysm. No large central pulmonary embolus. Mediastinum/Nodes: No mediastinal lymphadenopathy. There is no hilar lymphadenopathy. The esophagus has normal imaging features. Calcified nodal tissue identified in the right hilum. There is no axillary lymphadenopathy. Lungs/Pleura: Paraseptal emphysema noted bilaterally. 10 mm posterior right upper lobe pulmonary nodule is seen on image 38 series 6. This nodule has central stippled calcification. 5 mm posterior right lower lobe ground-glass nodule identified on image 62. No suspicious pulmonary nodule [place] mass in the left lung. There is no evidence for pneumonia, pulmonary edema, [place] pleural effusion on today's exam. Musculoskeletal: Bone windows reveal no worrisome lytic [place] sclerotic osseous lesions. CT ABDOMEN PELVIS FINDINGS Hepatobiliary: Scattered tiny hypodensities in the liver parenchyma are too small to characterize but likely represent tiny cysts. Gallbladder is nondistended. Mild  prominence of the intra and extrahepatic bile ducts is evident. Pancreas: No focal mass lesion. No dilatation of the main duct. No intraparenchymal cyst. No peripancreatic edema. Spleen: No splenomegaly. No focal mass lesion. Adrenals/Urinary Tract: No adrenal nodule [place] mass. No enhancing lesion in either kidney. No evidence for hydroureter. The urinary bladder appears normal for the degree of distention. Stomach/Bowel: Stomach is nondistended. No gastric wall thickening. No evidence of outlet obstruction. Duodenum is normally positioned as is the ligament of Treitz. No small bowel wall thickening. No small bowel dilatation. The terminal ileum is normal. The appendix is not visualized, but there is no edema [place] inflammation in the region of the cecum. Moderate colonic stool volume evident. Apparent wall thickening in the anal rectal junction although this area is difficult to assess by CT. Vascular/Lymphatic: No abdominal aortic aneurysm. No abdominal aortic atherosclerotic calcification. There is no gastrohepatic [place] hepatoduodenal ligament lymphadenopathy. No intraperitoneal [place] retroperitoneal lymphadenopathy. 2.3 x 1.6 cm lymph node is identified along the left internal iliac chain (see image 188 series 2). 10 x 13 mm lymph node is seen just cranial to this on image 182. Reproductive: Prostate gland mildly enlarged. Asymmetry of the seminal vesicles noted with the right seminal vesicle slightly larger than the left. This is indeterminate. Other: No intraperitoneal free fluid. Musculoskeletal: Bone windows reveal no worrisome lytic [place] sclerotic osseous lesions. IMPRESSION: 1. Apparent wall thickening in the distal rectum/anus. This is associated with 2 posterior left pelvic sidewall lymph nodes, highly suspicious for metastatic disease. 2. 10 mm right upper lobe pulmonary nodule has central stippled calcification and associated calcified nodal tissue in the right hilum. While the nodule is probably granulomatous,  close attention on follow-up is recommended to ensure stability. 3. Probable tiny hepatic cysts. Electronically Signed   By: Misty Stanley M.D.   On: 12/06/2016 13:46   Ct Abdomen Pelvis W Contrast  Result Date: 12/06/2016 CLINICAL DATA:  New diagnosis of rectal cancer. EXAM: CT CHEST,  ABDOMEN, AND PELVIS WITH CONTRAST TECHNIQUE: Multidetector CT imaging of the chest, abdomen and pelvis was performed following the standard protocol during bolus administration of intravenous contrast. CONTRAST:  129mL ISOVUE-300 IOPAMIDOL (ISOVUE-300) INJECTION 61% COMPARISON:  None. FINDINGS: CT CHEST FINDINGS Cardiovascular: The heart size is normal. No pericardial effusion. No thoracic aortic aneurysm. No large central pulmonary embolus. Mediastinum/Nodes: No mediastinal lymphadenopathy. There is no hilar lymphadenopathy. The esophagus has normal imaging features. Calcified nodal tissue identified in the right hilum. There is no axillary lymphadenopathy. Lungs/Pleura: Paraseptal emphysema noted bilaterally. 10 mm posterior right upper lobe pulmonary nodule is seen on image 38 series 6. This nodule has central stippled calcification. 5 mm posterior right lower lobe ground-glass nodule identified on image 62. No suspicious pulmonary nodule [place] mass in the left lung. There is no evidence for pneumonia, pulmonary edema, [place] pleural effusion on today's exam. Musculoskeletal: Bone windows reveal no worrisome lytic [place] sclerotic osseous lesions. CT ABDOMEN PELVIS FINDINGS Hepatobiliary: Scattered tiny hypodensities in the liver parenchyma are too small to characterize but likely represent tiny cysts. Gallbladder is nondistended. Mild prominence of the intra and extrahepatic bile ducts is evident. Pancreas: No focal mass lesion. No dilatation of the main duct. No intraparenchymal cyst. No peripancreatic edema. Spleen: No splenomegaly. No focal mass lesion. Adrenals/Urinary Tract: No adrenal nodule [place] mass. No enhancing lesion in either  kidney. No evidence for hydroureter. The urinary bladder appears normal for the degree of distention. Stomach/Bowel: Stomach is nondistended. No gastric wall thickening. No evidence of outlet obstruction. Duodenum is normally positioned as is the ligament of Treitz. No small bowel wall thickening. No small bowel dilatation. The terminal ileum is normal. The appendix is not visualized, but there is no edema [place] inflammation in the region of the cecum. Moderate colonic stool volume evident. Apparent wall thickening in the anal rectal junction although this area is difficult to assess by CT. Vascular/Lymphatic: No abdominal aortic aneurysm. No abdominal aortic atherosclerotic calcification. There is no gastrohepatic [place] hepatoduodenal ligament lymphadenopathy. No intraperitoneal [place] retroperitoneal lymphadenopathy. 2.3 x 1.6 cm lymph node is identified along the left internal iliac chain (see image 188 series 2). 10 x 13 mm lymph node is seen just cranial to this on image 182. Reproductive: Prostate gland mildly enlarged. Asymmetry of the seminal vesicles noted with the right seminal vesicle slightly larger than the left. This is indeterminate. Other: No intraperitoneal free fluid. Musculoskeletal: Bone windows reveal no worrisome lytic [place] sclerotic osseous lesions. IMPRESSION: 1. Apparent wall thickening in the distal rectum/anus. This is associated with 2 posterior left pelvic sidewall lymph nodes, highly suspicious for metastatic disease. 2. 10 mm right upper lobe pulmonary nodule has central stippled calcification and associated calcified nodal tissue in the right hilum. While the nodule is probably granulomatous, close attention on follow-up is recommended to ensure stability. 3. Probable tiny hepatic cysts. Electronically Signed   By: Misty Stanley M.D.   On: 12/06/2016 13:46   Nm Pet Image Initial (pi) Skull Base To Thigh  Result Date: 12/13/2016 CLINICAL DATA:  Initial treatment strategy for recently diagnosed  anal cancer on the 11/29/2016 surgical biopsy. EXAM: NUCLEAR MEDICINE PET SKULL BASE TO THIGH TECHNIQUE: 7.6 mCi F-18 FDG was injected intravenously. Full-ring PET imaging was performed from the skull base to thigh after the radiotracer. CT data was obtained and used for attenuation correction and anatomic localization. FASTING BLOOD GLUCOSE:  Value: 85 mg/dl COMPARISON:  12/06/2016 CT chest, abdomen and pelvis. FINDINGS: NECK Nonspecific symmetric hypermetabolism in the posterior  nasopharynx and palatine tonsils without CT correlate, probably physiologic/reactive. Mucous retention cyst versus polyp in the inferior right maxillary sinus. Hypermetabolic nonenlarged 0.6 cm left level 2 neck lymph node with max SUV 4.8 (series 4/image 24). Hypermetabolic nonenlarged 0.5 cm right level 2 neck lymph node with max SUV 4.1 (series 4/image 19). CHEST No hypermetabolic axillary, mediastinal [place] hilar lymph nodes. Mild paraseptal emphysema. No pneumothorax. No pleural effusions. Non hypermetabolic apical right upper lobe 1.0 cm pulmonary nodule with the internal calcification, consistent with a benign nodule. No acute consolidative airspace disease, lung masses [place] additional significant pulmonary nodules. ABDOMEN/PELVIS Hypermetabolic 4.0 x 3.3 x 6.3 cm anal mass with max SUV 25.3 (series 4/image 204). Multiple bilateral enlarged hypermetabolic internal iliac lymph nodes, for example a 1.3 cm left internal iliac node with max SUV 9.3 (series 4/image 179), a superior 1.0 cm left internal iliac node with max SUV 4.2 (series 4/image 172), and a superior right internal iliac 0.8 cm node with max SUV 6.2 (series 4/image 168). Hypermetabolic mildly enlarged 1.1 cm left inguinal lymph node (series 4/image 202). No hypermetabolic right inguinal lymph nodes. Mildly hypermetabolic nonenlarged 0.7 cm left para-aortic node with max SUV 3.5 (series 4/image 149). No abnormal hypermetabolic activity within the liver, pancreas, adrenal  glands, [place] spleen. SKELETON No focal hypermetabolic activity to suggest skeletal metastasis. IMPRESSION: 1. Intensely hypermetabolic 4.0 x 3.3 x 6.3 cm anal mass, compatible with known primary anal carcinoma. 2. Hypermetabolic bilateral internal iliac nodal metastases. 3. Hypermetabolic left inguinal nodal metastasis. 4. Solitary mildly hypermetabolic nonenlarged left para-aortic lymph node, suspicious for nodal metastasis. 5. No additional definite sites of hypermetabolic metastatic disease. 6. Nonspecific nonenlarged mildly hypermetabolic bilateral upper neck lymph nodes, statistically most likely to represent benign reactive nodes. 7. Mild paraseptal emphysema . Electronically Signed   By: Ilona Sorrel M.D.   On: 12/13/2016 11:13   Ir Fluoro Guide Cv Line Right  Result Date: 12/16/2016 CLINICAL DATA:  Anal carcinoma, needs durable venous access for chemotherapy regimen EXAM: PICC PLACEMENT WITH ULTRASOUND AND FLUOROSCOPY FLUOROSCOPY TIME:  6 sec (2 mGy) TECHNIQUE: After written informed consent was obtained, patient was placed in the supine position on angiographic table. Patency of the right basilic vein was confirmed with ultrasound with image documentation. An appropriate skin site was determined. Skin site was marked. Region was prepped using maximum barrier technique including cap and mask, sterile gown, sterile gloves, large sterile sheet, and Chlorhexidine as cutaneous antisepsis. The region was infiltrated locally with 1% lidocaine. Under real-time ultrasound guidance, the right basilic vein was accessed with a 21 gauge micropuncture needle; the needle tip within the vein was confirmed with ultrasound image documentation. Needle exchanged over a 018 guidewire for a peel-away sheath, through which a 5-French single-lumen power injectable PICC trimmed to 46cm was advanced, positioned with its tip near the cavoatrial junction. Spot chest radiograph confirms appropriate catheter position. Catheter was  flushed per protocol and secured externally. The patient tolerated procedure well. COMPLICATIONS: COMPLICATIONS none IMPRESSION: 1. Technically successful five [place] single lumen power injectable PICC placement Electronically Signed   By: Lucrezia Europe M.D.   On: 12/16/2016 09:00   Ir US Guide Vasc Access Right  Result Date: 12/16/2016 CLINICAL DATA:  Anal carcinoma, needs durable venous access for chemotherapy regimen EXAM: PICC PLACEMENT WITH ULTRASOUND AND FLUOROSCOPY FLUOROSCOPY TIME:  6 sec (2 mGy) TECHNIQUE: After written informed consent was obtained, patient was placed in the supine position on angiographic table. Patency of the right basilic vein was confirmed  with ultrasound with image documentation. An appropriate skin site was determined. Skin site was marked. Region was prepped using maximum barrier technique including cap and mask, sterile gown, sterile gloves, large sterile sheet, and Chlorhexidine as cutaneous antisepsis. The region was infiltrated locally with 1% lidocaine. Under real-time ultrasound guidance, the right basilic vein was accessed with a 21 gauge micropuncture needle; the needle tip within the vein was confirmed with ultrasound image documentation. Needle exchanged over a 018 guidewire for a peel-away sheath, through which a 5-French single-lumen power injectable PICC trimmed to 46cm was advanced, positioned with its tip near the cavoatrial junction. Spot chest radiograph confirms appropriate catheter position. Catheter was flushed per protocol and secured externally. The patient tolerated procedure well. COMPLICATIONS: COMPLICATIONS none IMPRESSION: 1. Technically successful five [place] single lumen power injectable PICC placement Electronically Signed   By: Lucrezia Europe M.D.   On: 12/16/2016 09:00    ASSESSMENT & PLAN:  46 y.o. African-American male, with history of HIV and AIDS, on genvoya since 03/2016,presents with rectal bleeding and pain.  1.  Anal cancer, invasive squamous  cell carcinoma,[id number], stage IIIA -I reviewed his CT scan findings and anal mass biopsy results in details with patient and his sister. -His CT scan showed significantly intact left internal iliac lymph node, highly suspicious for metastasis. No other distant metastasis on CT scan -He is scheduled for a PET scan for further evaluation and staging. -He has locally advanced anal cancer, I discussed the standard therapy with concurrent chemotherapy and radiation. We usually preserve surgery for residual cancer after chemotherapy and radiation, Harrison recurrent disease. 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 -I discussed the standard chemotherapy regiment with mitomycin on Day 1 and 5-FU infusion day 1-5 (96 hours) -The goal of therapy is curative -His CD4 has been improving since he started his HIV treatment can last summer. Recent CD4 was 180 on 12/11/2016. -Our chemotherapy pharmacist checked with the interaction between his HIV treatment and chemotherapy, and found no interaction -Lab reviewed, adequate treatment, we'll proceed cycle 1 day 1 mitomycin and 5-FU today, with full dose  - I will discuss with Juan Harrison also -will do lab weekly  -I have called in Compazine and Zofran for nausea as needed  2. Rectal bleeding and pain  -He takes oxycodone as needed for his rectal pain, but his pain has been worse. -Given him a prescription of MS Contin 15mg  q12h today  3. HIV/AIDS -His treatment was switched to genvoya in 05/2016, his CD4 has improved since then, but overall still low -continue HIV therapy when on chemo, will check with Juan Harrison    4. Social support  -His sister is involved with his care -I will social worker will meet the today to help him to arrange this transportation etc.  Plan -C1D1  chemo mitomycin and 5-FU today, full dose  -Lab follow-up with me Harrison a PPD Thursday Harrison Friday -Weekly lab and follow-up on Monday Harrison Tuesday during his chemo and RT   No orders of the defined types were placed in this encounter.   All questions were answered. The patient knows to call the clinic with any problems, questions Harrison concerns.  I spent 25 minutes counseling the patient face to face. The total time spent in the appointment was 30 minutes and more than 50% was  on counseling.     Juan Harrison 12/16/2016

## 2016-12-16 NOTE — Patient Instructions (Signed)
[facility] Discharge Instructions for Patients Receiving Chemotherapy  Today you received the following chemotherapy agents Mitomycin and 5FU.  To help prevent nausea and vomiting after your treatment, we encourage you to take your nausea medication as directed by your MD.   If you develop nausea and vomiting that is not controlled by your nausea medication, call the clinic.   BELOW ARE SYMPTOMS THAT SHOULD BE REPORTED IMMEDIATELY:  *FEVER GREATER THAN 100.5 F  *CHILLS WITH OR WITHOUT FEVER  NAUSEA AND VOMITING THAT IS NOT CONTROLLED WITH YOUR NAUSEA MEDICATION  *UNUSUAL SHORTNESS OF BREATH  *UNUSUAL BRUISING OR BLEEDING  TENDERNESS IN MOUTH AND THROAT WITH OR WITHOUT PRESENCE OF ULCERS  *URINARY PROBLEMS  *BOWEL PROBLEMS  UNUSUAL RASH Items with * indicate a potential emergency and should be followed up as soon as possible.  Feel free to call the clinic you have any questions or concerns. The clinic phone number is [phone].  Please show the [place] at check-in to the Emergency Department and triage nurse.  Mitomycin injection What is this medicine? MITOMYCIN (mye toe MYE sin) is a chemotherapy drug. This medicine is used to treat cancer of the stomach and pancreas. This medicine may be used for other purposes; ask your health care provider or pharmacist if you have questions. COMMON BRAND NAME(S): Mutamycin What should I tell my health care provider before I take this medicine? They need to know if you have any of these conditions: -anemia -bleeding disorder -infection (especially a virus infection such as chickenpox, cold sores, or herpes) -kidney disease -low blood counts like low platelets, red blood cells, white blood cells -recent radiation therapy -an unusual or allergic reaction to mitomycin, other chemotherapy agents, other medicines, foods, dyes, or preservatives -pregnant or trying to get pregnant -breast-feeding How should I  use this medicine? This drug is given as an injection or infusion into a vein. It is administered in a hospital or clinic by a specially trained health care professional. Talk to your pediatrician regarding the use of this medicine in children. Special care may be needed. Overdosage: If you think you have taken too much of this medicine contact a poison control center or emergency room at once. NOTE: This medicine is only for you. Do not share this medicine with others. What if I miss a dose? It is important not to miss your dose. Call your doctor or health care professional if you are unable to keep an appointment. What may interact with this medicine? -medicines to increase blood counts like filgrastim, pegfilgrastim, sargramostim -vaccines This list may not describe all possible interactions. Give your health care provider a list of all the medicines, herbs, non-prescription drugs, or dietary supplements you use. Also tell them if you smoke, drink alcohol, or use illegal drugs. Some items may interact with your medicine. What should I watch for while using this medicine? Your condition will be monitored carefully while you are receiving this medicine. You will need important blood work done while you are taking this medicine. This drug may make you feel generally unwell. This is not uncommon, as chemotherapy can affect healthy cells as well as cancer cells. Report any side effects. Continue your course of treatment even though you feel ill unless your doctor tells you to stop. Call your doctor or health care professional for advice if you get a fever, chills or sore throat, or other symptoms of a cold or flu. Do not treat yourself. This drug decreases your  body's ability to fight infections. Try to avoid being around people who are sick. This medicine may increase your risk to bruise or bleed. Call your doctor or health care professional if you notice any unusual bleeding. Be careful brushing and  flossing your teeth or using a toothpick because you may get an infection or bleed more easily. If you have any dental work done, tell your dentist you are receiving this medicine. Avoid taking products that contain aspirin, acetaminophen, ibuprofen, naproxen, or ketoprofen unless instructed by your doctor. These medicines may hide a fever. Do not become pregnant while taking this medicine. Women should inform their doctor if they wish to become pregnant or think they might be pregnant. There is a potential for serious side effects to an unborn child. Talk to your health care professional or pharmacist for more information. Do not breast-feed an infant while taking this medicine. What side effects may I notice from receiving this medicine? Side effects that you should report to your doctor or health care professional as soon as possible: -allergic reactions like skin rash, itching or hives, swelling of the face, lips, or tongue -low blood counts - this medicine may decrease the number of white blood cells, red blood cells and platelets. You may be at increased risk for infections and bleeding. -signs of infection - fever or chills, cough, sore throat, pain or difficulty passing urine -signs of decreased platelets or bleeding - bruising, pinpoint red spots on the skin, black, tarry stools, blood in the urine -signs of decreased red blood cells - unusually weak or tired, fainting spells, lightheadedness -breathing problems -changes in vision -chest pain -confusion -dry cough -high blood pressure -mouth sores -pain, swelling, redness at site where injected -pain, tingling, numbness in the hands or feet -seizures -swelling of the ankles, feet, hands -trouble passing urine or change in the amount of urine Side effects that usually do not require medical attention (report to your doctor or health care professional if they continue or are bothersome): -diarrhea -green to blue color of urine -hair  loss -loss of appetite -nausea, vomiting This list may not describe all possible side effects. Call your doctor for medical advice about side effects. You may report side effects to FDA at 1-800-FDA-1088. Where should I keep my medicine? This drug is given in a hospital or clinic and will not be stored at home. NOTE: This sheet is a summary. It may not cover all possible information. If you have questions about this medicine, talk to your doctor, pharmacist, or health care provider.  2017 Elsevier/Gold Standard (2008-05-26 11:16:23)  Fluorouracil, 5-FU injection What is this medicine? FLUOROURACIL, 5-FU (flure oh YOOR a sil) is a chemotherapy drug. It slows the growth of cancer cells. This medicine is used to treat many types of cancer like breast cancer, colon or rectal cancer, pancreatic cancer, and stomach cancer. This medicine may be used for other purposes; ask your health care provider or pharmacist if you have questions. COMMON BRAND NAME(S): Adrucil What should I tell my health care provider before I take this medicine? They need to know if you have any of these conditions: -blood disorders -dihydropyrimidine dehydrogenase (DPD) deficiency -infection (especially a virus infection such as chickenpox, cold sores, or herpes) -kidney disease -liver disease -malnourished, poor nutrition -recent or ongoing radiation therapy -an unusual or allergic reaction to fluorouracil, other chemotherapy, other medicines, foods, dyes, or preservatives -pregnant or trying to get pregnant -breast-feeding How should I use this medicine? This drug is given  as an infusion or injection into a vein. It is administered in a hospital or clinic by a specially trained health care professional. Talk to your pediatrician regarding the use of this medicine in children. Special care may be needed. Overdosage: If you think you have taken too much of this medicine contact a poison control center or emergency room  at once. NOTE: This medicine is only for you. Do not share this medicine with others. What if I miss a dose? It is important not to miss your dose. Call your doctor or health care professional if you are unable to keep an appointment. What may interact with this medicine? -allopurinol -cimetidine -dapsone -digoxin -hydroxyurea -leucovorin -levamisole -medicines for seizures like ethotoin, fosphenytoin, phenytoin -medicines to increase blood counts like filgrastim, pegfilgrastim, sargramostim -medicines that treat or prevent blood clots like warfarin, enoxaparin, and dalteparin -methotrexate -metronidazole -pyrimethamine -some other chemotherapy drugs like busulfan, cisplatin, estramustine, vinblastine -trimethoprim -trimetrexate -vaccines Talk to your doctor or health care professional before taking any of these medicines: -acetaminophen -aspirin -ibuprofen -ketoprofen -naproxen This list may not describe all possible interactions. Give your health care provider a list of all the medicines, herbs, non-prescription drugs, or dietary supplements you use. Also tell them if you smoke, drink alcohol, or use illegal drugs. Some items may interact with your medicine. What should I watch for while using this medicine? Visit your doctor for checks on your progress. This drug may make you feel generally unwell. This is not uncommon, as chemotherapy can affect healthy cells as well as cancer cells. Report any side effects. Continue your course of treatment even though you feel ill unless your doctor tells you to stop. In some cases, you may be given additional medicines to help with side effects. Follow all directions for their use. Call your doctor or health care professional for advice if you get a fever, chills or sore throat, or other symptoms of a cold or flu. Do not treat yourself. This drug decreases your body's ability to fight infections. Try to avoid being around people who are  sick. This medicine may increase your risk to bruise or bleed. Call your doctor or health care professional if you notice any unusual bleeding. Be careful brushing and flossing your teeth or using a toothpick because you may get an infection or bleed more easily. If you have any dental work done, tell your dentist you are receiving this medicine. Avoid taking products that contain aspirin, acetaminophen, ibuprofen, naproxen, or ketoprofen unless instructed by your doctor. These medicines may hide a fever. Do not become pregnant while taking this medicine. Women should inform their doctor if they wish to become pregnant or think they might be pregnant. There is a potential for serious side effects to an unborn child. Talk to your health care professional or pharmacist for more information. Do not breast-feed an infant while taking this medicine. Men should inform their doctor if they wish to father a child. This medicine may lower sperm counts. Do not treat diarrhea with over the counter products. Contact your doctor if you have diarrhea that lasts more than 2 days or if it is severe and watery. This medicine can make you more sensitive to the sun. Keep out of the sun. If you cannot avoid being in the sun, wear protective clothing and use sunscreen. Do not use sun lamps or tanning beds/booths. What side effects may I notice from receiving this medicine? Side effects that you should report to your doctor or health  care professional as soon as possible: -allergic reactions like skin rash, itching or hives, swelling of the face, lips, or tongue -low blood counts - this medicine may decrease the number of white blood cells, red blood cells and platelets. You may be at increased risk for infections and bleeding. -signs of infection - fever or chills, cough, sore throat, pain or difficulty passing urine -signs of decreased platelets or bleeding - bruising, pinpoint red spots on the skin, black, tarry stools,  blood in the urine -signs of decreased red blood cells - unusually weak or tired, fainting spells, lightheadedness -breathing problems -changes in vision -chest pain -mouth sores -nausea and vomiting -pain, swelling, redness at site where injected -pain, tingling, numbness in the hands or feet -redness, swelling, or sores on hands or feet -stomach pain -unusual bleeding Side effects that usually do not require medical attention (report to your doctor or health care professional if they continue or are bothersome): -changes in finger or toe nails -diarrhea -dry or itchy skin -hair loss -headache -loss of appetite -sensitivity of eyes to the light -stomach upset -unusually teary eyes This list may not describe all possible side effects. Call your doctor for medical advice about side effects. You may report side effects to FDA at 1-800-FDA-1088. Where should I keep my medicine? This drug is given in a hospital or clinic and will not be stored at home. NOTE: This sheet is a summary. It may not cover all possible information. If you have questions about this medicine, talk to your doctor, pharmacist, or health care provider.  2017 Elsevier/Gold Standard (2008-03-23 13:53:16)

## 2016-12-16 NOTE — Procedures (Signed)
R arm PowerPICC placed under US and fluoroscopy No ptx on spot chest radiograph. No complication No blood loss. See complete dictation in [facility] PACS.  

## 2016-12-16 NOTE — Progress Notes (Signed)
Oncology Nurse Navigator Documentation  Oncology Nurse Navigator Flowsheets 12/16/2016  Navigator Location [facility]  Referral date to RadOnc/MedOnc -  Navigator Encounter Type Treatment  Telephone -  Abnormal Finding Date -  Confirmed Diagnosis Date -  Treatment Initiated Date 12/16/2016  Patient Visit Type MedOnc  Treatment Phase Active Tx--1st Mitomycin C/ 5 FU  Barriers/Navigation Needs Education  Education Pain/ Symptom Management--antiemetics (he had not picked these up yet)  Interventions Education--instructed him to pick up his compazine and zofran TODAY--he may need it tomorrow.   Referrals -  Coordination of Care -  Education Method Verbal;Teach-back  Support Groups/Services -  Acuity -  Time Spent with Patient 15

## 2016-12-17 ENCOUNTER — Ambulatory Visit
Admission: RE | Admit: 2016-12-17 | Discharge: 2016-12-17 | Disposition: A | Discharge status: Home / Self Care | Payer: Medicare Other | Source: Ambulatory Visit | Attending: Radiation Oncology | Admitting: Radiation Oncology

## 2016-12-17 ENCOUNTER — Ambulatory Visit (HOSPITAL_COMMUNITY): Payer: Medicare Other

## 2016-12-17 DIAGNOSIS — F17210 Nicotine dependence, cigarettes, uncomplicated: Secondary | ICD-10-CM | POA: Diagnosis not present

## 2016-12-17 DIAGNOSIS — Z510 Encounter for antineoplastic radiation therapy: Secondary | ICD-10-CM | POA: Diagnosis not present

## 2016-12-17 DIAGNOSIS — C210 Malignant neoplasm of anus, unspecified: Secondary | ICD-10-CM | POA: Diagnosis not present

## 2016-12-17 DIAGNOSIS — B20 Human immunodeficiency virus [HIV] disease: Secondary | ICD-10-CM | POA: Diagnosis not present

## 2016-12-17 LAB — T-HELPER CELLS (CD4) COUNT (NOT AT ARMC)

## 2016-12-17 MED FILL — MORPHINE SULF ER 15 MG TAB: 15 | 15 days supply | Qty: 30 | Fill #0

## 2016-12-17 MED FILL — oxyCODONE HCL 5 MG TABS: 5 | 15 days supply | Qty: 60 | Fill #0

## 2016-12-18 ENCOUNTER — Ambulatory Visit
Admission: RE | Admit: 2016-12-18 | Discharge: 2016-12-18 | Disposition: A | Discharge status: Home / Self Care | Payer: Medicare Other | Source: Ambulatory Visit | Attending: Radiation Oncology | Admitting: Radiation Oncology

## 2016-12-18 DIAGNOSIS — F17210 Nicotine dependence, cigarettes, uncomplicated: Secondary | ICD-10-CM | POA: Diagnosis not present

## 2016-12-18 DIAGNOSIS — C210 Malignant neoplasm of anus, unspecified: Secondary | ICD-10-CM | POA: Diagnosis not present

## 2016-12-18 DIAGNOSIS — Z510 Encounter for antineoplastic radiation therapy: Secondary | ICD-10-CM | POA: Diagnosis not present

## 2016-12-18 DIAGNOSIS — B20 Human immunodeficiency virus [HIV] disease: Secondary | ICD-10-CM | POA: Diagnosis not present

## 2016-12-19 ENCOUNTER — Ambulatory Visit: Payer: Medicare Other

## 2016-12-19 ENCOUNTER — Encounter: Payer: Self-pay | Admitting: Radiation Oncology

## 2016-12-19 NOTE — Progress Notes (Signed)
Financial Counselor--Called patient--gave him information about our grants--he will bring in social security letter so he can apply for [employer]

## 2016-12-20 ENCOUNTER — Encounter: Payer: Self-pay | Admitting: Radiation Oncology

## 2016-12-20 ENCOUNTER — Ambulatory Visit (HOSPITAL_BASED_OUTPATIENT_CLINIC_OR_DEPARTMENT_OTHER): Payer: Medicare Other

## 2016-12-20 ENCOUNTER — Ambulatory Visit
Admission: RE | Admit: 2016-12-20 | Discharge: 2016-12-20 | Disposition: A | Discharge status: Home / Self Care | Payer: Medicare Other | Source: Ambulatory Visit | Attending: Radiation Oncology | Admitting: Radiation Oncology

## 2016-12-20 VITALS — BP 102/64 | HR 96 | Temp 97.8°F | Resp 20 | Wt 146.0 lb

## 2016-12-20 VITALS — BP 97/77 | HR 88 | Temp 97.8°F | Resp 20

## 2016-12-20 DIAGNOSIS — F17210 Nicotine dependence, cigarettes, uncomplicated: Secondary | ICD-10-CM | POA: Diagnosis not present

## 2016-12-20 DIAGNOSIS — Z452 Encounter for adjustment and management of vascular access device: Secondary | ICD-10-CM | POA: Diagnosis not present

## 2016-12-20 DIAGNOSIS — C210 Malignant neoplasm of anus, unspecified: Secondary | ICD-10-CM

## 2016-12-20 DIAGNOSIS — B20 Human immunodeficiency virus [HIV] disease: Secondary | ICD-10-CM | POA: Diagnosis not present

## 2016-12-20 DIAGNOSIS — Z510 Encounter for antineoplastic radiation therapy: Secondary | ICD-10-CM | POA: Diagnosis not present

## 2016-12-20 MED ORDER — SODIUM CHLORIDE 0.9% FLUSH
10.0000 mL | INTRAVENOUS | Status: DC | PRN
  Administered 2016-12-20: 10 mL
  Filled 2016-12-20: qty 10

## 2016-12-20 MED ORDER — HEPARIN SOD (PORK) LOCK FLUSH 100 UNIT/ML IV SOLN
250.0000 [IU] | Freq: Once | INTRAVENOUS | Status: AC | PRN
  Administered 2016-12-20: 250 [IU]
  Filled 2016-12-20: qty 5

## 2016-12-20 NOTE — Progress Notes (Signed)
Weekly rad ttx anal

## 2016-12-20 NOTE — Progress Notes (Signed)
Weekly rad txs anal 5 completed, pain 5/10 pain scale, taking MS contin q 12 hours and oxycodone IR 5mg  for breakthrough pain, bladder normal,  Last bowel movement yesterday, has 5 fu pump  Via right power picc. Appetite  Fair 11:35 AM BP 102/64 (BP Location: Right Arm, Patient Position: Sitting, Cuff Size: Normal)   Pulse 96   Temp 97.8 F (36.6 C) (Oral)   Resp 20   Wt 146 lb (66.2 kg)   BMI 18.25 kg/m   Wt Readings from Last 3 Encounters:  12/20/16 146 lb (66.2 kg)  12/16/16 148 lb 3.2 oz (67.2 kg)  12/10/16 149 lb 1.6 oz (67.6 kg)

## 2016-12-20 NOTE — Progress Notes (Signed)
   Department of Radiation Oncology  Phone:  [phone] Fax:        [phone]  Weekly Treatment Note    Name: Juan Harrison Date: 12/20/2016 MRN: [id number] DOB: 19-Nov-1971   Diagnosis:     ICD-9-CM ICD-10-CM   1. Anal cancer (HCC) 154.3 C21.0      Current dose: 7.2 Gy  Current fraction: 4   MEDICATIONS: Current Outpatient Prescriptions  Medication Sig Dispense Refill  . acetaminophen (TYLENOL) 500 MG tablet Take 500 mg by mouth every 6 (six) hours as needed.    . elvitegravir-cobicistat-emtricitabine-tenofovir (GENVOYA) 150-150-200-10 MG TABS tablet Take 1 tablet by mouth daily with breakfast. 90 tablet 3  . ibuprofen (ADVIL,MOTRIN) 200 MG tablet Take 200 mg by mouth as needed.    Marland Kitchen morphine (MS CONTIN) 15 MG 12 hr tablet Take 1 tablet (15 mg total) by mouth every 12 (twelve) hours. 30 tablet 0  . oxyCODONE (OXY IR/ROXICODONE) 5 MG immediate release tablet Take 1 tablet (5 mg total) by mouth every 6 (six) hours as needed. 60 tablet 0  . polyethylene glycol powder (MIRALAX) powder Take 17 g by mouth daily. 255 g 1  . valACYclovir (VALTREX) 1000 MG tablet Take 1 tablet (1,000 mg total) by mouth 2 (two) times daily. 20 tablet 0  . ondansetron (ZOFRAN) 8 MG tablet Take 1 tablet (8 mg total) by mouth 2 (two) times daily as needed (Nausea or vomiting). (Patient not taking: Reported on 12/16/2016) 30 tablet 1  . prochlorperazine (COMPAZINE) 10 MG tablet Take 1 tablet (10 mg total) by mouth every 6 (six) hours as needed (Nausea or vomiting). (Patient not taking: Reported on 12/16/2016) 30 tablet 1   No current facility-administered medications for this encounter.      ALLERGIES: Patient has no known allergies.   LABORATORY DATA:  Lab Results  Component Value Date   WBC 8.0 12/16/2016   HGB 14.3 12/16/2016   HCT 42.4 12/16/2016   MCV 88.4 12/16/2016   PLT 284 12/16/2016   Lab Results  Component Value Date   NA 137 12/16/2016   K 3.9 12/16/2016   CL 102  10/29/2016   CO2 26 12/16/2016   Lab Results  Component Value Date   ALT 24 12/16/2016   AST 17 12/16/2016   ALKPHOS 97 12/16/2016   BILITOT 0.62 12/16/2016     NARRATIVE: Juan Harrison was seen today for weekly treatment management. The chart was checked and the patient's films were reviewed.  The patient is doing very well during his first week of treatment. No difficulty so far.  PHYSICAL EXAMINATION: weight is 146 lb (66.2 kg). His oral temperature is 97.8 F (36.6 C). His blood pressure is 102/64 and his pulse is 96. His respiration is 20.        ASSESSMENT: The patient is doing satisfactorily with treatment. All of his questions were answered today.  PLAN: We will continue with the patient's radiation treatment as planned.

## 2016-12-22 ENCOUNTER — Telehealth: Payer: Self-pay | Admitting: Hematology

## 2016-12-22 NOTE — Telephone Encounter (Signed)
Lvm advising md appt on 1/23 @ 10am. Asked pt to collect a calendar then. Msg to md re: no MD/APP avail w/o 2/12. (Per LOS 1/15 pt needs MD/APP  Every Mon/Tues X's 5.)

## 2016-12-23 ENCOUNTER — Ambulatory Visit: Payer: Medicare Other

## 2016-12-23 ENCOUNTER — Ambulatory Visit
Admission: RE | Admit: 2016-12-23 | Discharge: 2016-12-23 | Disposition: A | Discharge status: Home / Self Care | Payer: Medicare Other | Source: Ambulatory Visit | Attending: Radiation Oncology | Admitting: Radiation Oncology

## 2016-12-23 ENCOUNTER — Other Ambulatory Visit (HOSPITAL_BASED_OUTPATIENT_CLINIC_OR_DEPARTMENT_OTHER): Payer: Medicare Other

## 2016-12-23 DIAGNOSIS — C210 Malignant neoplasm of anus, unspecified: Secondary | ICD-10-CM | POA: Diagnosis not present

## 2016-12-23 DIAGNOSIS — B20 Human immunodeficiency virus [HIV] disease: Secondary | ICD-10-CM | POA: Diagnosis not present

## 2016-12-23 DIAGNOSIS — F17210 Nicotine dependence, cigarettes, uncomplicated: Secondary | ICD-10-CM | POA: Diagnosis not present

## 2016-12-23 DIAGNOSIS — Z510 Encounter for antineoplastic radiation therapy: Secondary | ICD-10-CM | POA: Diagnosis not present

## 2016-12-23 LAB — CBC WITH DIFFERENTIAL/PLATELET
BASO%: 0.5 % (ref 0.0–2.0)
Basophils Absolute: 0 10*3/uL (ref 0.0–0.1)
EOS%: 2.5 % (ref 0.0–7.0)
Eosinophils Absolute: 0.1 10*3/uL (ref 0.0–0.5)
HCT: 40.1 % (ref 38.4–49.9)
HGB: 13.2 g/dL (ref 13.0–17.1)
LYMPH#: 0.6 10*3/uL — AB (ref 0.9–3.3)
LYMPH%: 23.1 % (ref 14.0–49.0)
MCH: 29.2 pg (ref 27.2–33.4)
MCHC: 32.9 g/dL (ref 32.0–36.0)
MCV: 88.8 fL (ref 79.3–98.0)
MONO#: 0.1 10*3/uL (ref 0.1–0.9)
MONO%: 5.5 % (ref 0.0–14.0)
NEUT%: 68.4 % (ref 39.0–75.0)
NEUTROS ABS: 1.8 10*3/uL (ref 1.5–6.5)
Platelets: 136 10*3/uL — ABNORMAL LOW (ref 140–400)
RBC: 4.52 10*6/uL (ref 4.20–5.82)
RDW: 13.5 % (ref 11.0–14.6)
WBC: 2.7 10*3/uL — AB (ref 4.0–10.3)

## 2016-12-23 LAB — COMPREHENSIVE METABOLIC PANEL
ALT: 23 U/L (ref 0–55)
ANION GAP: 7 meq/L (ref 3–11)
AST: 17 U/L (ref 5–34)
Albumin: 3.3 g/dL — ABNORMAL LOW (ref 3.5–5.0)
Alkaline Phosphatase: 84 U/L (ref 40–150)
BUN: 15.3 mg/dL (ref 7.0–26.0)
CO2: 29 meq/L (ref 22–29)
CREATININE: 0.9 mg/dL (ref 0.7–1.3)
Calcium: 9.1 mg/dL (ref 8.4–10.4)
Chloride: 102 mEq/L (ref 98–109)
EGFR: >90 mL/min/{1.73_m2} (ref >90)
GLUCOSE: 100 mg/dL (ref 70–140)
Potassium: 4.2 mEq/L (ref 3.5–5.1)
Sodium: 138 mEq/L (ref 136–145)
TOTAL PROTEIN: 7.7 g/dL (ref 6.4–8.3)
Total Bilirubin: 0.35 mg/dL (ref 0.20–1.20)

## 2016-12-23 NOTE — Progress Notes (Signed)
No show today, we called rad/onc and reminded his appointment with [place], he did not come.   Truitt Merle  12/24/2016   This encounter was created in error - please disregard.

## 2016-12-24 ENCOUNTER — Encounter: Payer: Medicare Other | Admitting: Hematology

## 2016-12-24 ENCOUNTER — Telehealth: Payer: Self-pay | Admitting: Hematology

## 2016-12-24 ENCOUNTER — Ambulatory Visit
Admission: RE | Admit: 2016-12-24 | Discharge: 2016-12-24 | Disposition: A | Discharge status: Home / Self Care | Payer: Medicare Other | Source: Ambulatory Visit | Attending: Radiation Oncology | Admitting: Radiation Oncology

## 2016-12-24 ENCOUNTER — Ambulatory Visit: Payer: Medicare Other

## 2016-12-24 DIAGNOSIS — C210 Malignant neoplasm of anus, unspecified: Secondary | ICD-10-CM | POA: Diagnosis not present

## 2016-12-24 DIAGNOSIS — Z510 Encounter for antineoplastic radiation therapy: Secondary | ICD-10-CM | POA: Diagnosis not present

## 2016-12-24 DIAGNOSIS — B20 Human immunodeficiency virus [HIV] disease: Secondary | ICD-10-CM | POA: Diagnosis not present

## 2016-12-24 DIAGNOSIS — F17210 Nicotine dependence, cigarettes, uncomplicated: Secondary | ICD-10-CM | POA: Diagnosis not present

## 2016-12-24 MED FILL — GENVOYA TABLET: 150-150-200 | 30 days supply | Qty: 30 | Fill #6 | Status: TO

## 2016-12-24 NOTE — Telephone Encounter (Signed)
Patient came to scheduling. He missed his appointment in the am with YF. Picked up his schedule with future scheduled appointments.

## 2016-12-25 ENCOUNTER — Ambulatory Visit: Payer: Medicare Other

## 2016-12-25 ENCOUNTER — Ambulatory Visit
Admission: RE | Admit: 2016-12-25 | Discharge: 2016-12-25 | Disposition: A | Discharge status: Home / Self Care | Payer: Medicare Other | Source: Ambulatory Visit | Attending: Radiation Oncology | Admitting: Radiation Oncology

## 2016-12-25 DIAGNOSIS — Z510 Encounter for antineoplastic radiation therapy: Secondary | ICD-10-CM | POA: Diagnosis not present

## 2016-12-25 DIAGNOSIS — B20 Human immunodeficiency virus [HIV] disease: Secondary | ICD-10-CM | POA: Diagnosis not present

## 2016-12-25 DIAGNOSIS — C210 Malignant neoplasm of anus, unspecified: Secondary | ICD-10-CM | POA: Diagnosis not present

## 2016-12-25 DIAGNOSIS — F17210 Nicotine dependence, cigarettes, uncomplicated: Secondary | ICD-10-CM | POA: Diagnosis not present

## 2016-12-25 NOTE — Progress Notes (Signed)
Patient came to nursing c/o itchiness on his bottom, " the cream that surgeon gave me makes my bottom itch", no skin breakdown on buttocks, stop using the cream he has been using and  suggested try cortisone cream ,just not 4 hours before coming for rad tx,will check again Friday after rad tx, he will bring the cream that surgeon gave him,, patient satisfied with seeing RN 12:12 PM

## 2016-12-26 ENCOUNTER — Ambulatory Visit: Payer: Medicare Other

## 2016-12-26 ENCOUNTER — Ambulatory Visit
Admission: RE | Admit: 2016-12-26 | Discharge: 2016-12-26 | Disposition: A | Discharge status: Home / Self Care | Payer: Medicare Other | Source: Ambulatory Visit | Attending: Radiation Oncology | Admitting: Radiation Oncology

## 2016-12-26 DIAGNOSIS — B20 Human immunodeficiency virus [HIV] disease: Secondary | ICD-10-CM | POA: Diagnosis not present

## 2016-12-26 DIAGNOSIS — C210 Malignant neoplasm of anus, unspecified: Secondary | ICD-10-CM | POA: Diagnosis not present

## 2016-12-26 DIAGNOSIS — Z510 Encounter for antineoplastic radiation therapy: Secondary | ICD-10-CM | POA: Diagnosis not present

## 2016-12-26 DIAGNOSIS — F17210 Nicotine dependence, cigarettes, uncomplicated: Secondary | ICD-10-CM | POA: Diagnosis not present

## 2016-12-27 ENCOUNTER — Ambulatory Visit
Admission: RE | Admit: 2016-12-27 | Discharge: 2016-12-27 | Disposition: A | Discharge status: Home / Self Care | Payer: Medicare Other | Source: Ambulatory Visit | Attending: Radiation Oncology | Admitting: Radiation Oncology

## 2016-12-27 ENCOUNTER — Ambulatory Visit: Payer: Medicare Other

## 2016-12-27 VITALS — BP 107/77 | HR 100 | Temp 97.5°F | Resp 18 | Wt 144.0 lb

## 2016-12-27 DIAGNOSIS — Z510 Encounter for antineoplastic radiation therapy: Secondary | ICD-10-CM | POA: Diagnosis not present

## 2016-12-27 DIAGNOSIS — C210 Malignant neoplasm of anus, unspecified: Secondary | ICD-10-CM | POA: Insufficient documentation

## 2016-12-27 DIAGNOSIS — B20 Human immunodeficiency virus [HIV] disease: Secondary | ICD-10-CM | POA: Diagnosis not present

## 2016-12-27 DIAGNOSIS — F17210 Nicotine dependence, cigarettes, uncomplicated: Secondary | ICD-10-CM | POA: Diagnosis not present

## 2016-12-27 MED ORDER — LORAZEPAM 0.5 MG PO TABS
0.5000 mg | ORAL_TABLET | Freq: Once | ORAL | Status: AC
  Administered 2016-12-27: 0.5 mg via ORAL
  Filled 2016-12-27: qty 1

## 2016-12-27 NOTE — Progress Notes (Addendum)
Weekly rad txs anal, only 8 treatments so far  here before treatment 10/10 pulling pain on his"testicles, checked his skin, on buttocks, no skin breakdown, "he stated it in between my legs,testicles, puling pain  10/10, moaning and tearful, looks like 2 areas where skin barely has broken, wants something for pain, took his oxycodone pain right before leaving home, no relief stated, regular formed  bowel movement today, no bladder changes, no blood in stool or urine 11:15 AM BP 107/77 (BP Location: Left Arm, Patient Position: Sitting, Cuff Size: Normal)   Pulse 100   Temp 97.5 F (36.4 C) (Oral)   Resp 18   Wt 144 lb (65.3 kg)   BMI 18.00 kg/m   Wt Readings from Last 3 Encounters:  12/27/16 144 lb (65.3 kg)  12/25/16 147 lb 3.2 oz (66.8 kg)  12/20/16 146 lb (66.2 kg)

## 2016-12-27 NOTE — Progress Notes (Signed)
0.5mg  ativan sl given  X 1 per verbal order Dr. Lisbeth Renshaw, offered warm blanket and ginger ale also, assisted patient t back in w/c,  11:40 AM'

## 2016-12-27 NOTE — Progress Notes (Signed)
Department of Radiation Oncology  Phone:  [phone] Fax:        [phone]  Weekly Treatment Note    Name: Juan Harrison Date: 12/27/2016 MRN: [id number] DOB: January 01, 1971   Diagnosis:     ICD-9-CM ICD-10-CM   1. 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 acetaminophen (TYLENOL) 500 MG tablet Take 500 mg by mouth every 6 (six) hours as needed.    . elvitegravir-cobicistat-emtricitabine-tenofovir (GENVOYA) 150-150-200-10 MG TABS tablet Take 1 tablet by mouth daily with breakfast. 90 tablet 3  . ibuprofen (ADVIL,MOTRIN) 200 MG tablet Take 200 mg by mouth as needed.    Marland Kitchen morphine (MS CONTIN) 15 MG 12 hr tablet Take 1 tablet (15 mg total) by mouth every 12 (twelve) hours. 30 tablet 0  . oxyCODONE (OXY IR/ROXICODONE) 5 MG immediate release tablet Take 1 tablet (5 mg total) by mouth every 6 (six) hours as needed. 60 tablet 0  . polyethylene glycol powder (MIRALAX) powder Take 17 g by mouth daily. 255 g 1  . valACYclovir (VALTREX) 1000 MG tablet Take 1 tablet (1,000 mg total) by mouth 2 (two) times daily. 20 tablet 0  . ondansetron (ZOFRAN) 8 MG tablet Take 1 tablet (8 mg total) by mouth 2 (two) times daily as needed (Nausea or vomiting). (Patient not taking: Reported on 12/16/2016) 30 tablet 1  . prochlorperazine (COMPAZINE) 10 MG tablet Take 1 tablet (10 mg total) by mouth every 6 (six) hours as needed (Nausea or vomiting). (Patient not taking: Reported on 12/16/2016) 30 tablet 1   No current facility-administered medications for this encounter.      ALLERGIES: Patient has no known allergies.   LABORATORY DATA:  Lab Results  Component Value Date   WBC 2.7 (L) 12/23/2016   HGB 13.2 12/23/2016   HCT 40.1 12/23/2016   MCV 88.8 12/23/2016   PLT 136 (L) 12/23/2016   Lab Results  Component Value Date   NA 138  12/23/2016   K 4.2 12/23/2016   CL 102 10/29/2016   CO2 29 12/23/2016   Lab Results  Component Value Date   ALT 23 12/23/2016   AST 17 12/23/2016   ALKPHOS 84 12/23/2016   BILITOT 0.35 12/23/2016     NARRATIVE: Juan Harrison was seen today for weekly treatment management. The chart was checked and the patient's films were reviewed.  Weekly rad txs anal, only 8 treatments so far  here before treatment 10/10 pulling pain on his"testicles, checked his skin, on buttocks, no skin breakdown, "he stated it in between my legs,testicles, puling pain  10/10, moaning and tearful, looks like 2 areas where skin barely has broken, wants something for pain, took his oxycodone pain right before leaving home, no relief stated, regular formed  bowel movement today, no bladder changes, no blood in stool or urine 1:35 PM BP 107/77 (BP Location: Left Arm, Patient Position: Sitting, Cuff Size: Normal)   Pulse 100   Temp 97.5 F (36.4 C) (Oral)   Resp 18   Wt 144 lb (65.3 kg)   BMI 18.00 kg/m   Wt Readings from Last 3 Encounters:  12/27/16 144 lb (65.3 kg)  12/25/16 147 lb 3.2 oz (66.8 kg)  12/20/16 146 lb (66.2 kg)   The patient is anxious today. He describes some pain in the testicular region which is significant. He states the  pain is worse when he is getting set up on the table for treatment.  PHYSICAL EXAMINATION: weight is 144 lb (65.3 kg). His oral temperature is 97.5 F (36.4 C). His blood pressure is 107/77 and his pulse is 100. His respiration is 18.      Minimal hyperpigmentation present in the genital region. The patient scan does not look as if it is having a more severe effect than normal at this time.  ASSESSMENT: The patient is doing satisfactorily with treatment.  PLAN: We will continue with the patient's radiation treatment as planned. The patient is quite anxious today. We will give him some Ativan to see if this helps him get through treatment. He may potentially need this on an  ongoing basis if this difficulty continues. We will also continue to monitor his pain as well and the expected skin reaction the next several weeks.

## 2016-12-30 ENCOUNTER — Ambulatory Visit: Payer: Medicare Other

## 2016-12-30 ENCOUNTER — Telehealth: Payer: Self-pay | Admitting: Radiation Oncology

## 2016-12-30 ENCOUNTER — Other Ambulatory Visit (HOSPITAL_BASED_OUTPATIENT_CLINIC_OR_DEPARTMENT_OTHER): Payer: Medicare Other

## 2016-12-30 ENCOUNTER — Ambulatory Visit (HOSPITAL_BASED_OUTPATIENT_CLINIC_OR_DEPARTMENT_OTHER): Payer: Medicare Other | Admitting: Oncology

## 2016-12-30 ENCOUNTER — Encounter: Payer: Self-pay | Admitting: Oncology

## 2016-12-30 ENCOUNTER — Ambulatory Visit
Admission: RE | Admit: 2016-12-30 | Discharge: 2016-12-30 | Disposition: A | Discharge status: Home / Self Care | Payer: Medicare Other | Source: Ambulatory Visit | Attending: Radiation Oncology | Admitting: Radiation Oncology

## 2016-12-30 VITALS — BP 106/74 | HR 98 | Temp 97.6°F | Resp 20 | Ht 75.0 in | Wt 142.7 lb

## 2016-12-30 DIAGNOSIS — C210 Malignant neoplasm of anus, unspecified: Secondary | ICD-10-CM | POA: Diagnosis not present

## 2016-12-30 DIAGNOSIS — B20 Human immunodeficiency virus [HIV] disease: Secondary | ICD-10-CM

## 2016-12-30 DIAGNOSIS — F17210 Nicotine dependence, cigarettes, uncomplicated: Secondary | ICD-10-CM | POA: Diagnosis not present

## 2016-12-30 DIAGNOSIS — Z510 Encounter for antineoplastic radiation therapy: Secondary | ICD-10-CM | POA: Diagnosis not present

## 2016-12-30 LAB — CBC WITH DIFFERENTIAL/PLATELET
BASO%: 0.1 % (ref 0.0–2.0)
Basophils Absolute: 0 10*3/uL (ref 0.0–0.1)
EOS ABS: 0.1 10*3/uL (ref 0.0–0.5)
EOS%: 5.2 % (ref 0.0–7.0)
HEMATOCRIT: 38.4 % (ref 38.4–49.9)
HEMOGLOBIN: 12.8 g/dL — AB (ref 13.0–17.1)
LYMPH%: 17.2 % (ref 14.0–49.0)
MCH: 29.1 pg (ref 27.2–33.4)
MCHC: 33.3 g/dL (ref 32.0–36.0)
MCV: 87.4 fL (ref 79.3–98.0)
MONO#: 0.3 10*3/uL (ref 0.1–0.9)
MONO%: 13.6 % (ref 0.0–14.0)
NEUT%: 63.9 % (ref 39.0–75.0)
NEUTROS ABS: 1.3 10*3/uL — AB (ref 1.5–6.5)
PLATELETS: 81 10*3/uL — AB (ref 140–400)
RBC: 4.4 10*6/uL (ref 4.20–5.82)
RDW: 13.4 % (ref 11.0–14.6)
WBC: 2 10*3/uL — AB (ref 4.0–10.3)
lymph#: 0.3 10*3/uL — ABNORMAL LOW (ref 0.9–3.3)

## 2016-12-30 LAB — COMPREHENSIVE METABOLIC PANEL
ALBUMIN: 3.5 g/dL (ref 3.5–5.0)
ALK PHOS: 85 U/L (ref 40–150)
ALT: 35 U/L (ref 0–55)
ANION GAP: 9 meq/L (ref 3–11)
AST: 17 U/L (ref 5–34)
BILIRUBIN TOTAL: 0.33 mg/dL (ref 0.20–1.20)
BUN: 13.4 mg/dL (ref 7.0–26.0)
CALCIUM: 9.5 mg/dL (ref 8.4–10.4)
CO2: 27 mEq/L (ref 22–29)
Chloride: 102 mEq/L (ref 98–109)
Creatinine: 1.1 mg/dL (ref 0.7–1.3)
GLUCOSE: 107 mg/dL (ref 70–140)
Potassium: 3.8 mEq/L (ref 3.5–5.1)
Sodium: 139 mEq/L (ref 136–145)
TOTAL PROTEIN: 7.6 g/dL (ref 6.4–8.3)

## 2016-12-30 NOTE — Telephone Encounter (Signed)
I called the patient's sister and she reports that her brother seems to be doing well since radiation started. His CD4 count has improved slightly up to 180 from 110 (two months ago). I called the patient too and he thought he was having trouble with a skin reaction from cream he had been given previously by another provider. He's unsure the name of the medication, but has stopped using it. I encouraged him to let me know if he has any concerns, or side effects of treatment. He also reports he is not anxious about treatment.

## 2016-12-30 NOTE — Progress Notes (Signed)
Juan Harrison  [phone] [phone]  Clinic follow up Note   Patient Care Team: No Pcp Per Patient as PCP - General (General Practice) Juan Riches, MD as Consulting Physician (Infectious Diseases) Juan Ruff, MD as Consulting Physician (General Surgery) Juan Rudd, MD as Consulting Physician (Radiation Oncology) 12/30/2016  CHIEF COMPLAINTS:  Follow up anal cancer  Oncology History   Anal cancer ([place])   Staging form: Anus, AJCC 8th Edition   - Clinical: Stage IIIA (cT2, cN1a, cM0) - Signed by Juan Merle, MD on 12/10/2016      Anal cancer ([place])   11/29/2016 Initial Biopsy    Anal canal mass biopsy showed 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 Marcello Harrison in OR:"There was an obvious anal mass noted in the left posterior lateral anal canal.  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   I then placed a Hill-Ferguson anoscope into the anal canal and evaluated this completely.  The mass was noted with an area of central necrosis invading into the sphincter complex.  The edges of the ulcer were trimmed away using scissors.  This was sent to pathology for further examination       12/06/2016 Imaging    CT chest, abdomen and pelvis with contrast showed apparent wall thickening in the distal rectum and anus, this is associated with 2 posterior left pelvic sidewall nodes, highly suspicious for metastatic disease. 10 mm right upper lobe pulmonary nodule has essential stippled calcification and associated calcified nodule tissue in the right hilum, probably granuloma. No other distant metastasis.       12/10/2016 Initial Diagnosis    Anal cancer ([place])     12/13/2016 Imaging    NM PET Image Initial (pi) Skull Base to Thigh  IMPRESSION: 1. Intensely hypermetabolic 4.0 x 3.3 x 6.3 cm anal mass,  compatible with known primary anal carcinoma. 2. Hypermetabolic bilateral internal iliac nodal metastases. 3. Hypermetabolic left inguinal nodal metastasis. 4. Solitary mildly hypermetabolic nonenlarged left para-aortic lymph node, suspicious for nodal metastasis. 5. No additional definite sites of hypermetabolic metastatic disease. 6. Nonspecific nonenlarged mildly hypermetabolic bilateral upper neck lymph nodes, statistically most likely to represent benign reactive nodes. 7. Mild paraseptal emphysema        HISTORY OF PRESENTING ILLNESS (12/11/2015):  Juan Harrison 46 y.o. male with past medical history of HIV, is here because of His recent diagnosed anal cancer. He is accompanied by his sister to the clinic today.  He has had bloody BM and rectal pain for one month, his rectal bleeding is mild, mixed with stool, no blood clots. He rates his rectal pain at 7-8/10, especially with bowel movement. he takes oxycodone 1-2/day.   Patient was seen by colorectal surgeon Dr. Marcello Harrison, could not tolerate rectal exam and anoscope in office, was brought to the OR for the above exam, which showed " There was an obvious anal mass noted in the left posterior lateral anal canal.  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   I then placed a Hill-Ferguson anoscope into the anal canal and evaluated this completely.  The mass was noted with an area of central necrosis invading into the sphincter complex.  The edges of the ulcer were trimmed away using scissors.  This was sent to pathology for further examination". The biopsy showed  squamous cell carcinoma.  He was found to be HIV positive in 2007, has been seen Per ID Dr. Johnnye Harrison, not very compliant. He previously on  DRVr/TRV/EPZ bactrim, changed to genvoya 05-2016.  His last CD4 was 110 on 10/29/2016.   He is single, lives alone in a two story townhouse. He does not work, on [employer]. He has a sister Juan Harrison  who lives in [place]. He has some friends, only able to help if he needs.  CURRENT THERAPY: Concurrent radiation with chemotherapy mitomycin on day 1 and 28, 5-FU on day 1-5, and day 28-32  INTERIM HISTORY: Juan Harrison returns for follow up. Received chemotherapy with mitomycin on 12/16/2016 along with 5-FU days one through 5 beginning on 12/16/2016. He continues radiation therapy. He complains of rectal itching and pain, he is not able to sit due to the pain and itching. He was using a cream given to him by surgery which she states caused him increased pain and he is no longer using this. He was given a recommendation for another cream by [place] which he has not yet obtained. He remains on MS Contin twice a day and uses oxycodone for breakthrough pain 1-2 times per day. He sleeps on his side at night. He reports that his appetite has not been good and he has lost some weight.  MEDICAL HISTORY:  Past Medical History:  Diagnosis Date  . 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 Anal pain   . Cancer ([place]) 11/29/2016   anal invasive mod diff squamous cell ca w/squamous cell ca in situ  . Condyloma   . Hepatitis B surface antigen positive     SURGICAL HISTORY: Past Surgical History:  Procedure Laterality Date  . IR GENERIC HISTORICAL  12/16/2016   IR US GUIDE VASC ACCESS RIGHT 12/16/2016 Juan Cleveland, MD [facility]INTER[facility]  . IR GENERIC HISTORICAL  12/16/2016   IR FLUORO GUIDE CV LINE RIGHT 12/16/2016 Juan Cleveland, MD [facility]  . RECTAL BIOPSY N/A 11/29/2016   Procedure: BIOPSY ANAL CANAL MASS;  Surgeon: Juan Ruff, MD;  Location: [facility];  Service: General;  Laterality: N/A;  . RECTAL EXAM UNDER ANESTHESIA N/A 11/29/2016   Procedure: EXAM UNDER ANESTHESIA;  Surgeon: Juan Ruff, MD;  Location: [facility];  Service: General;  Laterality: N/A;  . 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 Marital status: Single    Spouse name: N/A  . Number of children: N/A  . Years of education: N/A   Occupational History  . Not on file.   Social History Main Topics  . Smoking status: Current Every Day Smoker    Packs/day: 0.25    Years: 20.00    Types: Cigarettes    Last attempt to quit: 10/13/2016  . Smokeless tobacco: Never Used     Comment: 2-3 cigarettes a day   . Alcohol use No     Comment: He used to drink alcohol on weekends for 25 years, he quit on 10/14/2016   . Drug use: Yes    Types: Marijuana  . Sexual activity: Not on file   Other Topics Concern  . Not on file   Social History Narrative   Single, lives alone about 10 minutes from [place] in 2 story townhouse   Has one sister, Juan Harrison who lives in [place]  Transportation in past via friends   On disability    FAMILY HISTORY: Family History  Problem Relation Age of Onset  . COPD Mother   . Asthma Brother     ALLERGIES:  has No Known Allergies.  MEDICATIONS:  Current Outpatient Prescriptions  Medication Sig Dispense Refill  . acetaminophen (TYLENOL) 500 MG tablet Take 500 mg by mouth every 6 (six) hours as needed.    . elvitegravir-cobicistat-emtricitabine-tenofovir (GENVOYA) 150-150-200-10 MG TABS tablet Take 1 tablet by mouth daily with breakfast. 90 tablet 3  . ibuprofen (ADVIL,MOTRIN) 200 MG tablet Take 200 mg by mouth as needed.    Marland Kitchen morphine (MS CONTIN) 15 MG 12 hr tablet Take 1 tablet (15 mg total) by mouth every 12 (twelve) hours. 30 tablet 0  . oxyCODONE (OXY IR/ROXICODONE) 5 MG immediate release tablet Take 1 tablet (5 mg total) by mouth every 6 (six) hours as needed. 60 tablet 0  . polyethylene glycol powder (MIRALAX) powder Take 17 g by mouth daily. 255 g 1  . valACYclovir (VALTREX) 1000 MG tablet Take 1 tablet (1,000 mg total) by mouth 2 (two) times daily. 20 tablet 0  . ondansetron (ZOFRAN) 8 MG tablet Take 1 tablet (8 mg total) by mouth 2  (two) times daily as needed (Nausea or vomiting). (Patient not taking: Reported on 12/30/2016) 30 tablet 1  . prochlorperazine (COMPAZINE) 10 MG tablet Take 1 tablet (10 mg total) by mouth every 6 (six) hours as needed (Nausea or vomiting). (Patient not taking: Reported on 12/30/2016) 30 tablet 1   No current facility-administered medications for this visit.     REVIEW OF SYSTEMS:   Constitutional: Denies fevers, chills or abnormal night sweats Eyes: Denies blurriness of vision, double vision or watery eyes Ears, nose, mouth, throat, and face: Denies mucositis or sore throat Respiratory: Denies cough, dyspnea or wheezes Cardiovascular: Denies palpitation, chest discomfort or lower extremity swelling Gastrointestinal:  Denies nausea, heartburn or change in bowel habits Skin: Denies abnormal skin rashes Lymphatics: Denies new lymphadenopathy or easy bruising Neurological:Denies numbness, tingling or new weaknesses Behavioral/Psych: Mood is stable, no new changes  All other systems were reviewed with the patient and are negative.  PHYSICAL EXAMINATION: ECOG PERFORMANCE STATUS: 1 - Symptomatic but completely ambulatory  Vitals:   12/30/16 1152  BP: 106/74  Pulse: 98  Resp: 20  Temp: 97.6 F (36.4 C)   Filed Weights   12/30/16 1152  Weight: 142 lb 11.2 oz (64.7 kg)    GENERAL:alert, no distress and comfortable SKIN: skin color, texture, turgor are normal, no rashes or significant lesions EYES: normal, conjunctiva are pink and non-injected, sclera clear OROPHARYNX:no exudate, no erythema and lips, buccal mucosa, and tongue normal  NECK: supple, thyroid normal size, non-tender, without nodularity LYMPH:  no palpable lymphadenopathy in the cervical, axillary or inguinal LUNGS: clear to auscultation and percussion with normal breathing effort HEART: regular rate & rhythm and no murmurs and no lower extremity edema ABDOMEN:abdomen soft, non-tender and normal bowel sounds. Rectal exam  showed some mild excoriation. Visualization of the anus reveals condylomatous change with changes circumferentially at the anal verge consistent with high grade dysplasia.  Musculoskeletal:no cyanosis of digits and no clubbing  PSYCH: alert & oriented x 3 with fluent speech NEURO: no focal motor/sensory deficits  LABORATORY DATA:  I have reviewed the data as listed CBC Latest Ref Rng & Units 12/30/2016 12/23/2016 12/16/2016  WBC 4.0 - 10.3 10e3/uL 2.0(L) 2.7(L) 8.0  Hemoglobin 13.0 - 17.1 g/dL 12.8(L) 13.2 14.3  Hematocrit 38.4 - 49.9 % 38.4 40.1 42.4  Platelets 140 - 400 10e3/uL 81(L) 136(L) 284   CMP Latest Ref Rng & Units 12/30/2016 12/23/2016 12/16/2016  Glucose 70 - 140 mg/dl 107 100 102  BUN 7.0 - 26.0 mg/dL 13.4 15.3 11.0  Creatinine 0.7 - 1.3 mg/dL 1.1 0.9 1.0  Sodium 136 - 145 mEq/L 139 138 137  Potassium 3.5 - 5.1 mEq/L 3.8 4.2 3.9  Chloride 98 - 110 mmol/L - - -  CO2 22 - 29 mEq/L 27 29 26   Calcium 8.4 - 10.4 mg/dL 9.5 9.1 9.8  Total Protein 6.4 - 8.3 g/dL 7.6 7.7 8.3  Total Bilirubin 0.20 - 1.20 mg/dL 0.33 0.35 0.62  Alkaline Phos 40 - 150 U/L 85 84 97  AST 5 - 34 U/L 17 17 17   ALT 0 - 55 U/L 35 23 24   PATHOLOGY REPORT  Diagnosis 11/29/2016 Anus, biopsy, anal canal mass - INVASIVE MODERATELY DIFFERENTIATED SQUAMOUS CELL CARCINOMA ASSOCIATED WITH SQUAMOUS CELL CARCINOMA IN SITU.    RADIOGRAPHIC STUDIES: I have personally reviewed the radiological images as listed and agreed with the findings in the report. Ct Chest W Contrast  Result Date: 12/06/2016 CLINICAL DATA:  New diagnosis of rectal cancer. EXAM: CT CHEST, ABDOMEN, AND PELVIS WITH CONTRAST TECHNIQUE: Multidetector CT imaging of the chest, abdomen and pelvis was performed following the standard protocol during bolus administration of intravenous contrast. CONTRAST:  183mL ISOVUE-300 IOPAMIDOL (ISOVUE-300) INJECTION 61% COMPARISON:  None. FINDINGS: CT CHEST FINDINGS Cardiovascular: The heart size is normal. No  pericardial effusion. No thoracic aortic aneurysm. No large central pulmonary embolus. Mediastinum/Nodes: No mediastinal lymphadenopathy. There is no hilar lymphadenopathy. The esophagus has normal imaging features. Calcified nodal tissue identified in the right hilum. There is no axillary lymphadenopathy. Lungs/Pleura: Paraseptal emphysema noted bilaterally. 10 mm posterior right upper lobe pulmonary nodule is seen on image 38 series 6. This nodule has central stippled calcification. 5 mm posterior right lower lobe ground-glass nodule identified on image 62. No suspicious pulmonary nodule or mass in the left lung. There is no evidence for pneumonia, pulmonary edema, or pleural effusion on today's exam. Musculoskeletal: Bone windows reveal no worrisome lytic or sclerotic osseous lesions. CT ABDOMEN PELVIS FINDINGS Hepatobiliary: Scattered tiny hypodensities in the liver parenchyma are too small to characterize but likely represent tiny cysts. Gallbladder is nondistended. Mild prominence of the intra and extrahepatic bile ducts is evident. Pancreas: No focal mass lesion. No dilatation of the main duct. No intraparenchymal cyst. No peripancreatic edema. Spleen: No splenomegaly. No focal mass lesion. Adrenals/Urinary Tract: No adrenal nodule or mass. No enhancing lesion in either kidney. No evidence for hydroureter. The urinary bladder appears normal for the degree of distention. Stomach/Bowel: Stomach is nondistended. No gastric wall thickening. No evidence of outlet obstruction. Duodenum is normally positioned as is the ligament of Treitz. No small bowel wall thickening. No small bowel dilatation. The terminal ileum is normal. The appendix is not visualized, but there is no edema or inflammation in the region of the cecum. Moderate colonic stool volume evident. Apparent wall thickening in the anal rectal junction although this area is difficult to assess by CT. Vascular/Lymphatic: No abdominal aortic aneurysm. No  abdominal aortic atherosclerotic calcification. There is no gastrohepatic or hepatoduodenal ligament lymphadenopathy. No intraperitoneal or retroperitoneal lymphadenopathy. 2.3 x 1.6 cm lymph node is identified along the left internal iliac chain (see image 188 series 2). 10 x 13 mm lymph node is seen just cranial to this on image 182. Reproductive:  Prostate gland mildly enlarged. Asymmetry of the seminal vesicles noted with the right seminal vesicle slightly larger than the left. This is indeterminate. Other: No intraperitoneal free fluid. Musculoskeletal: Bone windows reveal no worrisome lytic or sclerotic osseous lesions. IMPRESSION: 1. Apparent wall thickening in the distal rectum/anus. This is associated with 2 posterior left pelvic sidewall lymph nodes, highly suspicious for metastatic disease. 2. 10 mm right upper lobe pulmonary nodule has central stippled calcification and associated calcified nodal tissue in the right hilum. While the nodule is probably granulomatous, close attention on follow-up is recommended to ensure stability. 3. Probable tiny hepatic cysts. Electronically Signed   By: Misty Stanley M.D.   On: 12/06/2016 13:46   Ct Abdomen Pelvis W Contrast  Result Date: 12/06/2016 CLINICAL DATA:  New diagnosis of rectal cancer. EXAM: CT CHEST, ABDOMEN, AND PELVIS WITH CONTRAST TECHNIQUE: Multidetector CT imaging of the chest, abdomen and pelvis was performed following the standard protocol during bolus administration of intravenous contrast. CONTRAST:  164mL ISOVUE-300 IOPAMIDOL (ISOVUE-300) INJECTION 61% COMPARISON:  None. FINDINGS: CT CHEST FINDINGS Cardiovascular: The heart size is normal. No pericardial effusion. No thoracic aortic aneurysm. No large central pulmonary embolus. Mediastinum/Nodes: No mediastinal lymphadenopathy. There is no hilar lymphadenopathy. The esophagus has normal imaging features. Calcified nodal tissue identified in the right hilum. There is no axillary lymphadenopathy.  Lungs/Pleura: Paraseptal emphysema noted bilaterally. 10 mm posterior right upper lobe pulmonary nodule is seen on image 38 series 6. This nodule has central stippled calcification. 5 mm posterior right lower lobe ground-glass nodule identified on image 62. No suspicious pulmonary nodule or mass in the left lung. There is no evidence for pneumonia, pulmonary edema, or pleural effusion on today's exam. Musculoskeletal: Bone windows reveal no worrisome lytic or sclerotic osseous lesions. CT ABDOMEN PELVIS FINDINGS Hepatobiliary: Scattered tiny hypodensities in the liver parenchyma are too small to characterize but likely represent tiny cysts. Gallbladder is nondistended. Mild prominence of the intra and extrahepatic bile ducts is evident. Pancreas: No focal mass lesion. No dilatation of the main duct. No intraparenchymal cyst. No peripancreatic edema. Spleen: No splenomegaly. No focal mass lesion. Adrenals/Urinary Tract: No adrenal nodule or mass. No enhancing lesion in either kidney. No evidence for hydroureter. The urinary bladder appears normal for the degree of distention. Stomach/Bowel: Stomach is nondistended. No gastric wall thickening. No evidence of outlet obstruction. Duodenum is normally positioned as is the ligament of Treitz. No small bowel wall thickening. No small bowel dilatation. The terminal ileum is normal. The appendix is not visualized, but there is no edema or inflammation in the region of the cecum. Moderate colonic stool volume evident. Apparent wall thickening in the anal rectal junction although this area is difficult to assess by CT. Vascular/Lymphatic: No abdominal aortic aneurysm. No abdominal aortic atherosclerotic calcification. There is no gastrohepatic or hepatoduodenal ligament lymphadenopathy. No intraperitoneal or retroperitoneal lymphadenopathy. 2.3 x 1.6 cm lymph node is identified along the left internal iliac chain (see image 188 series 2). 10 x 13 mm lymph node is seen just  cranial to this on image 182. Reproductive: Prostate gland mildly enlarged. Asymmetry of the seminal vesicles noted with the right seminal vesicle slightly larger than the left. This is indeterminate. Other: No intraperitoneal free fluid. Musculoskeletal: Bone windows reveal no worrisome lytic or sclerotic osseous lesions. IMPRESSION: 1. Apparent wall thickening in the distal rectum/anus. This is associated with 2 posterior left pelvic sidewall lymph nodes, highly suspicious for metastatic disease. 2. 10 mm right upper lobe pulmonary nodule has central  stippled calcification and associated calcified nodal tissue in the right hilum. While the nodule is probably granulomatous, close attention on follow-up is recommended to ensure stability. 3. Probable tiny hepatic cysts. Electronically Signed   By: Misty Stanley M.D.   On: 12/06/2016 13:46   Nm Pet Image Initial (pi) Skull Base To Thigh  Result Date: 12/13/2016 CLINICAL DATA:  Initial treatment strategy for recently diagnosed anal cancer on the 11/29/2016 surgical biopsy. EXAM: NUCLEAR MEDICINE PET SKULL BASE TO THIGH TECHNIQUE: 7.6 mCi F-18 FDG was injected intravenously. Full-ring PET imaging was performed from the skull base to thigh after the radiotracer. CT data was obtained and used for attenuation correction and anatomic localization. FASTING BLOOD GLUCOSE:  Value: 85 mg/dl COMPARISON:  12/06/2016 CT chest, abdomen and pelvis. FINDINGS: NECK Nonspecific symmetric hypermetabolism in the posterior nasopharynx and palatine tonsils without CT correlate, probably physiologic/reactive. Mucous retention cyst versus polyp in the inferior right maxillary sinus. Hypermetabolic nonenlarged 0.6 cm left level 2 neck lymph node with max SUV 4.8 (series 4/image 24). Hypermetabolic nonenlarged 0.5 cm right level 2 neck lymph node with max SUV 4.1 (series 4/image 19). CHEST No hypermetabolic axillary, mediastinal or hilar lymph nodes. Mild paraseptal emphysema. No  pneumothorax. No pleural effusions. Non hypermetabolic apical right upper lobe 1.0 cm pulmonary nodule with the internal calcification, consistent with a benign nodule. No acute consolidative airspace disease, lung masses or additional significant pulmonary nodules. ABDOMEN/PELVIS Hypermetabolic 4.0 x 3.3 x 6.3 cm anal mass with max SUV 25.3 (series 4/image 204). Multiple bilateral enlarged hypermetabolic internal iliac lymph nodes, for example a 1.3 cm left internal iliac node with max SUV 9.3 (series 4/image 179), a superior 1.0 cm left internal iliac node with max SUV 4.2 (series 4/image 172), and a superior right internal iliac 0.8 cm node with max SUV 6.2 (series 4/image 168). Hypermetabolic mildly enlarged 1.1 cm left inguinal lymph node (series 4/image 202). No hypermetabolic right inguinal lymph nodes. Mildly hypermetabolic nonenlarged 0.7 cm left para-aortic node with max SUV 3.5 (series 4/image 149). No abnormal hypermetabolic activity within the liver, pancreas, adrenal glands, or spleen. SKELETON No focal hypermetabolic activity to suggest skeletal metastasis. IMPRESSION: 1. Intensely hypermetabolic 4.0 x 3.3 x 6.3 cm anal mass, compatible with known primary anal carcinoma. 2. Hypermetabolic bilateral internal iliac nodal metastases. 3. Hypermetabolic left inguinal nodal metastasis. 4. Solitary mildly hypermetabolic nonenlarged left para-aortic lymph node, suspicious for nodal metastasis. 5. No additional definite sites of hypermetabolic metastatic disease. 6. Nonspecific nonenlarged mildly hypermetabolic bilateral upper neck lymph nodes, statistically most likely to represent benign reactive nodes. 7. Mild paraseptal emphysema . Electronically Signed   By: Ilona Sorrel M.D.   On: 12/13/2016 11:13   Ir Fluoro Guide Cv Line Right  Result Date: 12/16/2016 CLINICAL DATA:  Anal carcinoma, needs durable venous access for chemotherapy regimen EXAM: PICC PLACEMENT WITH ULTRASOUND AND FLUOROSCOPY FLUOROSCOPY  TIME:  6 sec (2 mGy) TECHNIQUE: After written informed consent was obtained, patient was placed in the supine position on angiographic table. Patency of the right basilic vein was confirmed with ultrasound with image documentation. An appropriate skin site was determined. Skin site was marked. Region was prepped using maximum barrier technique including cap and mask, sterile gown, sterile gloves, large sterile sheet, and Chlorhexidine as cutaneous antisepsis. The region was infiltrated locally with 1% lidocaine. Under real-time ultrasound guidance, the right basilic vein was accessed with a 21 gauge micropuncture needle; the needle tip within the vein was confirmed with ultrasound image documentation. Needle exchanged  over a 018 guidewire for a peel-away sheath, through which a 5-French single-lumen power injectable PICC trimmed to 46cm was advanced, positioned with its tip near the cavoatrial junction. Spot chest radiograph confirms appropriate catheter position. Catheter was flushed per protocol and secured externally. The patient tolerated procedure well. COMPLICATIONS: COMPLICATIONS none IMPRESSION: 1. Technically successful five [place] single lumen power injectable PICC placement Electronically Signed   By: Lucrezia Europe M.D.   On: 12/16/2016 09:00   Ir US Guide Vasc Access Right  Result Date: 12/16/2016 CLINICAL DATA:  Anal carcinoma, needs durable venous access for chemotherapy regimen EXAM: PICC PLACEMENT WITH ULTRASOUND AND FLUOROSCOPY FLUOROSCOPY TIME:  6 sec (2 mGy) TECHNIQUE: After written informed consent was obtained, patient was placed in the supine position on angiographic table. Patency of the right basilic vein was confirmed with ultrasound with image documentation. An appropriate skin site was determined. Skin site was marked. Region was prepped using maximum barrier technique including cap and mask, sterile gown, sterile gloves, large sterile sheet, and Chlorhexidine as cutaneous antisepsis. The  region was infiltrated locally with 1% lidocaine. Under real-time ultrasound guidance, the right basilic vein was accessed with a 21 gauge micropuncture needle; the needle tip within the vein was confirmed with ultrasound image documentation. Needle exchanged over a 018 guidewire for a peel-away sheath, through which a 5-French single-lumen power injectable PICC trimmed to 46cm was advanced, positioned with its tip near the cavoatrial junction. Spot chest radiograph confirms appropriate catheter position. Catheter was flushed per protocol and secured externally. The patient tolerated procedure well. COMPLICATIONS: COMPLICATIONS none IMPRESSION: 1. Technically successful five [place] single lumen power injectable PICC placement Electronically Signed   By: Lucrezia Europe M.D.   On: 12/16/2016 09:00    ASSESSMENT & PLAN:  46 y.o. African-American male, with history of HIV and AIDS, on genvoya since 03/2016,presents with rectal bleeding and pain.  1.  Anal cancer, invasive squamous cell carcinoma,[id number], stage IIIA -I reviewed his CT scan findings and anal mass biopsy results in details with patient and his sister. -His CT scan showed significantly intact left internal iliac lymph node, highly suspicious for metastasis. No other distant metastasis on CT scan -He is scheduled for a PET scan for further evaluation and staging. -He has locally advanced anal cancer, I discussed the standard therapy with concurrent chemotherapy and radiation. We usually preserve surgery for residual cancer after chemotherapy and radiation, or recurrent disease. 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 -I discussed the standard chemotherapy regiment with mitomycin on Day 1 and 5-FU infusion day 1-5 (96 hours) -The goal of therapy is curative -His  CD4 has been improving since he started his HIV treatment can last summer. Recent CD4 was 180 on 12/11/2016. -Our chemotherapy pharmacist checked with the interaction between his HIV treatment and chemotherapy, and found no interaction -Lab reviewed, adequate treatment, we'll proceed cycle 1 day 1 mitomycin and 5-FU today, with full dose  - I will discuss with Dr. Johnnye Harrison also -will do lab weekly  -I have called in Compazine and Zofran for nausea as needed  2. Rectal bleeding and pain  -He takes oxycodone as needed for his rectal pain. -Taking MS Contin 15mg  q12h   3. HIV/AIDS -His treatment was switched to genvoya in 05/2016, his CD4 has improved since then, but overall still low -continue HIV  therapy when on chemo, will check with Dr. Johnnye Harrison    4. Social support  -His sister is involved with his care -I will social worker will meet the today to help him to arrange this transportation etc.  Plan -Tolerated his first cycle of chemotherapy well overall. Continue radiation.  -We will refer to dietitian due to decreased appetite and weight loss. -Weekly lab and follow-up on Monday or Tuesday during his chemo and RT   No orders of the defined types were placed in this encounter.   All questions were answered. The patient knows to call the clinic with any problems, questions or concerns.  I spent 15 minutes counseling the patient face to face. The total time spent in the appointment was 20 minutes and more than 50% was on counseling.     Mikey Bussing, NP 12/30/2016

## 2016-12-31 ENCOUNTER — Ambulatory Visit: Payer: Medicare Other

## 2016-12-31 ENCOUNTER — Ambulatory Visit
Admission: RE | Admit: 2016-12-31 | Discharge: 2016-12-31 | Disposition: A | Discharge status: Home / Self Care | Payer: Medicare Other | Source: Ambulatory Visit | Attending: Radiation Oncology | Admitting: Radiation Oncology

## 2016-12-31 DIAGNOSIS — B20 Human immunodeficiency virus [HIV] disease: Secondary | ICD-10-CM | POA: Diagnosis not present

## 2016-12-31 DIAGNOSIS — C210 Malignant neoplasm of anus, unspecified: Secondary | ICD-10-CM | POA: Diagnosis not present

## 2016-12-31 DIAGNOSIS — Z510 Encounter for antineoplastic radiation therapy: Secondary | ICD-10-CM | POA: Diagnosis not present

## 2016-12-31 DIAGNOSIS — F17210 Nicotine dependence, cigarettes, uncomplicated: Secondary | ICD-10-CM | POA: Diagnosis not present

## 2017-01-01 ENCOUNTER — Telehealth: Payer: Self-pay | Admitting: Radiation Oncology

## 2017-01-01 ENCOUNTER — Encounter: Payer: Self-pay | Admitting: *Deleted

## 2017-01-01 ENCOUNTER — Ambulatory Visit
Admission: RE | Admit: 2017-01-01 | Discharge: 2017-01-01 | Disposition: A | Discharge status: Home / Self Care | Payer: Medicare Other | Source: Ambulatory Visit | Attending: Radiation Oncology | Admitting: Radiation Oncology

## 2017-01-01 ENCOUNTER — Ambulatory Visit: Payer: Medicare Other

## 2017-01-01 VITALS — BP 103/78 | HR 105 | Temp 98.0°F | Resp 18 | Wt 145.8 lb

## 2017-01-01 DIAGNOSIS — Z510 Encounter for antineoplastic radiation therapy: Secondary | ICD-10-CM | POA: Diagnosis not present

## 2017-01-01 DIAGNOSIS — F17210 Nicotine dependence, cigarettes, uncomplicated: Secondary | ICD-10-CM | POA: Diagnosis not present

## 2017-01-01 DIAGNOSIS — C210 Malignant neoplasm of anus, unspecified: Secondary | ICD-10-CM

## 2017-01-01 DIAGNOSIS — B20 Human immunodeficiency virus [HIV] disease: Secondary | ICD-10-CM | POA: Diagnosis not present

## 2017-01-01 MED ORDER — OXYCODONE HCL 5 MG PO TABS: 5.0000 mg | ORAL_TABLET | ORAL | 0 refills | Status: DC | PRN

## 2017-01-01 MED ORDER — MORPHINE SULFATE ER 15 MG PO TBCR: 15.0000 mg | EXTENDED_RELEASE_TABLET | Freq: Two times a day (BID) | ORAL | 0 refills | Status: DC

## 2017-01-01 MED ORDER — OXYCODONE-ACETAMINOPHEN 5-325 MG PO TABS
1.0000 | ORAL_TABLET | Freq: Once | ORAL | Status: AC
  Administered 2017-01-01: 1 via ORAL
  Filled 2017-01-01: qty 1

## 2017-01-01 NOTE — Progress Notes (Signed)
1142 Complained of pain to anus 10/10 percocet 5-325 mg given patient stated he could not stay his ride was upstairs and he really had to go now or he would miss his ride home.  I asked him to rest when he got home and the pain medication should help reduce his pain level.

## 2017-01-01 NOTE — Progress Notes (Signed)
Patient presents to the clinic requesting to be evaluated by a provider. Patient seen yesterday by Thayer Headings, RN for the same issue. Weight stable. Tachycardic. Reports rectal pain 10 on a scale of 0-10. Skin breakdown and external hemorrhoids around anus noted. Patient reports area of skin breakdown is painful and itches. Reports taking oxycodone in the mornings before treatment and at night before bed to manage pain. Patient obviously in pain evidence by inability to sit still and grimaces. Requesting a prescription cream to mange itching and pain at his anus. Reports occasional bright red blood on tissue when he wipes after having a bowel movement. Reports bowel are loose. Denies dysuria or hematuria. Reports his last chemo was 2 of 3 weeks ago.  BP 103/78 (BP Location: Left Arm, Patient Position: Sitting, Cuff Size: Normal)   Pulse (!) 105   Temp 98 F (36.7 C) (Oral)   Resp 18   Wt 145 lb 12.8 oz (66.1 kg)   SpO2 100%   BMI 18.22 kg/m  Wt Readings from Last 3 Encounters:  01/01/17 145 lb 12.8 oz (66.1 kg)  12/30/16 142 lb 11.2 oz (64.7 kg)  12/27/16 144 lb (65.3 kg)

## 2017-01-01 NOTE — Telephone Encounter (Signed)
I was unable to reach the pt's daughter. She has a phone that's not had voicemail set up.

## 2017-01-02 ENCOUNTER — Ambulatory Visit
Admission: RE | Admit: 2017-01-02 | Discharge: 2017-01-02 | Disposition: A | Discharge status: Home / Self Care | Payer: Medicare Other | Source: Ambulatory Visit | Attending: Radiation Oncology | Admitting: Radiation Oncology

## 2017-01-02 ENCOUNTER — Ambulatory Visit: Payer: Medicare Other

## 2017-01-02 ENCOUNTER — Telehealth: Payer: Self-pay | Admitting: *Deleted

## 2017-01-02 DIAGNOSIS — C210 Malignant neoplasm of anus, unspecified: Secondary | ICD-10-CM | POA: Diagnosis not present

## 2017-01-02 DIAGNOSIS — Z510 Encounter for antineoplastic radiation therapy: Secondary | ICD-10-CM | POA: Diagnosis not present

## 2017-01-02 DIAGNOSIS — F17210 Nicotine dependence, cigarettes, uncomplicated: Secondary | ICD-10-CM | POA: Diagnosis not present

## 2017-01-02 DIAGNOSIS — B20 Human immunodeficiency virus [HIV] disease: Secondary | ICD-10-CM | POA: Diagnosis not present

## 2017-01-02 DIAGNOSIS — K6289 Other specified diseases of anus and rectum: Secondary | ICD-10-CM

## 2017-01-02 MED ORDER — SILVER SULFADIAZINE 1 % EX CREA
TOPICAL_CREAM | Freq: Two times a day (BID) | CUTANEOUS | Status: DC
  Administered 2017-01-02: 17:00:00 via TOPICAL

## 2017-01-02 MED ORDER — MORPHINE SULFATE (PF) 4 MG/ML IV SOLN
2.0000 mg | Freq: Once | INTRAVENOUS | Status: DC
  Filled 2017-01-02: qty 0.5

## 2017-01-02 MED ORDER — MORPHINE SULFATE 4 MG/ML IJ SOLN
2.0000 mg | Freq: Once | INTRAMUSCULAR | Status: AC
  Administered 2017-01-02: 2 mg via INTRAMUSCULAR
  Filled 2017-01-02: qty 1

## 2017-01-02 NOTE — Progress Notes (Signed)
Late entry from 01/01/17. Per Shona Simpson, PA-C order applied a thin layer off SSD to area of breakdown around patient's anus and genitals. Provided patient with jar of SSD cream and directed upon use. Also, provided patient with box of large gloves and encouraged him to wash his hand before and after application and only apply cream while wearing gloves. Patient verbalized understanding.

## 2017-01-02 NOTE — Progress Notes (Signed)
[facility]  [phone] Fax:(336) [phone]  Clinic follow up Note   Patient Care Team: No Pcp Per Patient as PCP - General (General Practice) Juan Riches, MD as Consulting Physician (Infectious Diseases) Juan Ruff, MD as Consulting Physician (General Surgery) Juan Rudd, MD as Consulting Physician (Radiation Oncology) 01/07/2017  CHIEF COMPLAINTS:  Follow up anal cancer  Oncology History   Anal cancer ([place])   Staging form: Anus, AJCC 8th Edition   - Clinical: Stage IIIA (cT2, cN1a, cM0) - Signed by Juan Merle, MD on 12/10/2016      Anal cancer ([place])   11/29/2016 Initial Biopsy    Anal canal mass biopsy showed 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 Marcello Harrison in OR:"There was an obvious anal mass noted in the left posterior lateral anal canal.  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   I then placed a Hill-Ferguson anoscope into the anal canal and evaluated this completely.  The mass was noted with an area of central necrosis invading into the sphincter complex.  The edges of the ulcer were trimmed away using scissors.  This was sent to pathology for further examination       12/06/2016 Imaging    CT chest, abdomen and pelvis with contrast showed apparent wall thickening in the distal rectum and anus, this is associated with 2 posterior left pelvic sidewall nodes, highly suspicious for metastatic disease. 10 mm right upper lobe pulmonary nodule has essential stippled calcification and associated calcified nodule tissue in the right hilum, probably granuloma. No other distant metastasis.       12/10/2016 Initial Diagnosis    Anal cancer ([place])     12/13/2016 Imaging    NM PET Image Initial (pi) Skull Base to Thigh  IMPRESSION: 1. Intensely hypermetabolic 4.0 x 3.3 x 6.3 cm anal mass,  compatible with known primary anal carcinoma. 2. Hypermetabolic bilateral internal iliac nodal metastases. 3. Hypermetabolic left inguinal nodal metastasis. 4. Solitary mildly hypermetabolic nonenlarged left para-aortic lymph node, suspicious for nodal metastasis. 5. No additional definite sites of hypermetabolic metastatic disease. 6. Nonspecific nonenlarged mildly hypermetabolic bilateral upper neck lymph nodes, statistically most likely to represent benign reactive nodes. 7. Mild paraseptal emphysema        HISTORY OF PRESENTING ILLNESS (12/11/2015):  Juan Harrison 46 y.o. male with past medical history of HIV, is here because of His recent diagnosed anal cancer. He is accompanied by his sister to the clinic today.  He has had bloody BM and rectal pain for one month, his rectal bleeding is mild, mixed with stool, no blood clots. He rates his rectal pain at 7-8/10, especially with bowel movement. he takes oxycodone 1-2/day.   Patient was seen by colorectal surgeon Dr. Marcello Harrison, could not tolerate rectal exam and anoscope in office, was brought to the OR for the above exam, which showed " There was an obvious anal mass noted in the left posterior lateral anal canal.  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   I then placed a Hill-Ferguson anoscope into the anal canal and evaluated this completely.  The mass was noted with an area of central necrosis invading into the sphincter complex.  The edges of the ulcer were trimmed away using scissors.  This was sent to pathology for further examination". The biopsy showed  squamous cell carcinoma.  He was found to be HIV positive in 2007, has been seen Per ID Dr. Johnnye Harrison, not very compliant. He previously on  DRVr/TRV/EPZ bactrim, changed to genvoya 05-2016.  His last CD4 was 110 on 10/29/2016.   He is single, lives alone in a two story townhouse. He does not work, on [employer]. He has a sister Juan Harrison  who lives in [place]. He has some friends, only able to help if he needs.  CURRENT THERAPY: Concurrent radiation with chemotherapy mitomycin on day 1 and 28, 5-FU on day 1-5, and day 28-32  INTERIM HISTORY: Mr. Juan Harrison returns for follow up. If he lays down at night and doesn't put the cream on his skin, he has some skin tightness. He has some skin degradation and pain. He takes his pain medication 3 times a day, which helps some and allows him to sleep at night. His bowel movements are normal. Denies blood in stool, constipation, or any other concerns. His pick line is still in place because he didn't realize he was suppose to get it taken out. It has not been flushed. He has not seen any pus or discharge.   MEDICAL HISTORY:  Past Medical History:  Diagnosis Date  . AIDS [facility]) infectious disease--- dr Juan Harrison   dx 2007,  usCD4,  count at 110  . Anal pain   . Cancer ([place]) 11/29/2016   anal invasive mod diff squamous cell ca w/squamous cell ca in situ  . Condyloma   . Hepatitis B surface antigen positive     SURGICAL HISTORY: Past Surgical History:  Procedure Laterality Date  . IR GENERIC HISTORICAL  12/16/2016   IR US GUIDE VASC ACCESS RIGHT 12/16/2016 Juan Cleveland, MD [facility]INTER[facility]  . IR GENERIC HISTORICAL  12/16/2016   IR FLUORO GUIDE CV LINE RIGHT 12/16/2016 Juan Cleveland, MD [facility]  . RECTAL BIOPSY N/A 11/29/2016   Procedure: BIOPSY ANAL CANAL MASS;  Surgeon: Juan Ruff, MD;  Location: [facility];  Service: General;  Laterality: N/A;  . RECTAL EXAM UNDER ANESTHESIA N/A 11/29/2016   Procedure: EXAM UNDER ANESTHESIA;  Surgeon: Juan Ruff, MD;  Location: [facility];  Service: General;  Laterality: N/A;  . 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 Marital status: Single    Spouse name: N/A  . Number of children: N/A  . Years of education: N/A   Occupational History  . Not on  file.   Social History Main Topics  . Smoking status: Current Every Day Smoker    Packs/day: 0.25    Years: 20.00    Types: Cigarettes    Last attempt to quit: 10/13/2016  . Smokeless tobacco: Never Used     Comment: 2-3 cigarettes a day   . Alcohol use No     Comment: He used to drink alcohol on weekends for 25 years, he quit on 10/14/2016   . Drug use: Yes    Types: Marijuana  . Sexual activity: Not on file   Other Topics Concern  . Not on file   Social History Narrative   Single, lives alone about 10 minutes from [employer] in 2 story townhouse   Has one sister, Juan Harrison who lives in [place] in past via friends   On disability    FAMILY HISTORY: Family History  Problem Relation Age of Onset  . COPD  Mother   . Asthma Brother     ALLERGIES:  has No Known Allergies.  MEDICATIONS:  Current Outpatient Prescriptions  Medication Sig Dispense Refill  . acetaminophen (TYLENOL) 500 MG tablet Take 500 mg by mouth every 6 (six) hours as needed.    . elvitegravir-cobicistat-emtricitabine-tenofovir (GENVOYA) 150-150-200-10 MG TABS tablet Take 1 tablet by mouth daily with breakfast. 90 tablet 3  . ibuprofen (ADVIL,MOTRIN) 200 MG tablet Take 200 mg by mouth as needed.    Marland Kitchen morphine (MS CONTIN) 15 MG 12 hr tablet Take 1 tablet (15 mg total) by mouth every 12 (twelve) hours. 30 tablet 0  . ondansetron (ZOFRAN) 8 MG tablet Take 1 tablet (8 mg total) by mouth 2 (two) times daily as needed (Nausea or vomiting). 30 tablet 1  . oxyCODONE (OXY IR/ROXICODONE) 5 MG immediate release tablet Take 1 tablet (5 mg total) by mouth every 4 (four) hours as needed. 60 tablet 0  . polyethylene glycol powder (MIRALAX) powder Take 17 g by mouth daily. 255 g 1  . prochlorperazine (COMPAZINE) 10 MG tablet Take 1 tablet (10 mg total) by mouth every 6 (six) hours as needed (Nausea or vomiting). 30 tablet 1  . silver sulfADIAZINE (SILVADENE) 1 % cream Apply 1 application topically 2 (two) times  daily. Apply a thin layer twice daily to affected area.    . valACYclovir (VALTREX) 1000 MG tablet Take 1 tablet (1,000 mg total) by mouth 2 (two) times daily. 20 tablet 0   No current facility-administered medications for this visit.    Facility-Administered Medications Ordered in Other Visits  Medication Dose Route Frequency Provider Last Rate Last Dose  . silver sulfADIAZINE (SILVADENE) 1 % cream   Topical Daily Hayden Pedro, PA-C        REVIEW OF SYSTEMS:   Constitutional: Denies fevers, chills or abnormal night sweats Eyes: Denies blurriness of vision, double vision or watery eyes Ears, nose, mouth, throat, and face: Denies mucositis or sore throat Respiratory: Denies cough, dyspnea or wheezes Cardiovascular: Denies palpitation, chest discomfort or lower extremity swelling Gastrointestinal:  Denies nausea, heartburn or change in bowel habits Skin: Denies abnormal skin rashes (+) tightness and degradation at radiation site.  Lymphatics: Denies new lymphadenopathy or easy bruising Neurological:Denies numbness, tingling or new weaknesses Behavioral/Psych: Mood is stable, no new changes  All other systems were reviewed with the patient and are negative.  PHYSICAL EXAMINATION: ECOG PERFORMANCE STATUS: 1 - Symptomatic but completely ambulatory  Vitals:   01/07/17 1508  BP: 112/60  Pulse: 73  Resp: 18  Temp: 98 F (36.7 C)   Filed Weights   01/07/17 1508  Weight: 146 lb 14.4 oz (66.6 kg)   GENERAL:alert, no distress and comfortable SKIN: skin color, texture, turgor are normal, no rashes or significant lesions EYES: normal, conjunctiva are pink and non-injected, sclera clear OROPHARYNX:no exudate, no erythema and lips, buccal mucosa, and tongue normal  NECK: supple, thyroid normal size, non-tender, without nodularity LYMPH:  no palpable lymphadenopathy in the cervical, axillary or inguinal LUNGS: clear to auscultation and percussion with normal breathing  effort HEART: regular rate & rhythm and no murmurs and no lower extremity edema ABDOMEN:abdomen soft, non-tender and normal bowel sounds, rectal exam was deferred  Musculoskeletal:no cyanosis of digits and no clubbing  Anus: (+) multiple small skin ulcers in the skin folder between The buttocks, and at the right groin and testicular area. PSYCH: alert & oriented x 3 with fluent speech NEURO: no focal motor/sensory deficits  LABORATORY DATA:  I have reviewed the data as listed CBC Latest Ref Rng & Units 01/07/2017 01/06/2017 12/30/2016  WBC 4.0 - 10.3 10e3/uL 5.0 3.6(L) 2.0(L)  Hemoglobin 13.0 - 17.1 g/dL 11.9(L) 12.1(L) 12.8(L)  Hematocrit 38.4 - 49.9 % 35.5(L) 36.5(L) 38.4  Platelets 140 - 400 10e3/uL 170 125(L) 81(L)   CMP Latest Ref Rng & Units 01/07/2017 01/06/2017 12/30/2016  Glucose 70 - 140 mg/dl 111 118 107  BUN 7.0 - 26.0 mg/dL 9.2 9.8 13.4  Creatinine 0.7 - 1.3 mg/dL 1.1 1.0 1.1  Sodium 136 - 145 mEq/L 139 138 139  Potassium 3.5 - 5.1 mEq/L 3.7 3.9 3.8  Chloride 98 - 110 mmol/L - - -  CO2 22 - 29 mEq/L 28 28 27   Calcium 8.4 - 10.4 mg/dL 9.1 9.4 9.5  Total Protein 6.4 - 8.3 g/dL 7.1 7.3 7.6  Total Bilirubin 0.20 - 1.20 mg/dL <0.22 <0.22 0.33  Alkaline Phos 40 - 150 U/L 73 75 85  AST 5 - 34 U/L 14 15 17   ALT 0 - 55 U/L 25 29 35   PATHOLOGY REPORT  Diagnosis 11/29/2016 Anus, biopsy, anal canal mass - INVASIVE MODERATELY DIFFERENTIATED SQUAMOUS CELL CARCINOMA ASSOCIATED WITH SQUAMOUS CELL CARCINOMA IN SITU.  RADIOGRAPHIC STUDIES: I have personally reviewed the radiological images as listed and agreed with the findings in the report. Nm Pet Image Initial (pi) Skull Base To Thigh  Result Date: 12/13/2016 CLINICAL DATA:  Initial treatment strategy for recently diagnosed anal cancer on the 11/29/2016 surgical biopsy. EXAM: NUCLEAR MEDICINE PET SKULL BASE TO THIGH TECHNIQUE: 7.6 mCi F-18 FDG was injected intravenously. Full-ring PET imaging was performed from the skull base to thigh  after the radiotracer. CT data was obtained and used for attenuation correction and anatomic localization. FASTING BLOOD GLUCOSE:  Value: 85 mg/dl COMPARISON:  12/06/2016 CT chest, abdomen and pelvis. FINDINGS: NECK Nonspecific symmetric hypermetabolism in the posterior nasopharynx and palatine tonsils without CT correlate, probably physiologic/reactive. Mucous retention cyst versus polyp in the inferior right maxillary sinus. Hypermetabolic nonenlarged 0.6 cm left level 2 neck lymph node with max SUV 4.8 (series 4/image 24). Hypermetabolic nonenlarged 0.5 cm right level 2 neck lymph node with max SUV 4.1 (series 4/image 19). CHEST No hypermetabolic axillary, mediastinal or hilar lymph nodes. Mild paraseptal emphysema. No pneumothorax. No pleural effusions. Non hypermetabolic apical right upper lobe 1.0 cm pulmonary nodule with the internal calcification, consistent with a benign nodule. No acute consolidative airspace disease, lung masses or additional significant pulmonary nodules. ABDOMEN/PELVIS Hypermetabolic 4.0 x 3.3 x 6.3 cm anal mass with max SUV 25.3 (series 4/image 204). Multiple bilateral enlarged hypermetabolic internal iliac lymph nodes, for example a 1.3 cm left internal iliac node with max SUV 9.3 (series 4/image 179), a superior 1.0 cm left internal iliac node with max SUV 4.2 (series 4/image 172), and a superior right internal iliac 0.8 cm node with max SUV 6.2 (series 4/image 168). Hypermetabolic mildly enlarged 1.1 cm left inguinal lymph node (series 4/image 202). No hypermetabolic right inguinal lymph nodes. Mildly hypermetabolic nonenlarged 0.7 cm left para-aortic node with max SUV 3.5 (series 4/image 149). No abnormal hypermetabolic activity within the liver, pancreas, adrenal glands, or spleen. SKELETON No focal hypermetabolic activity to suggest skeletal metastasis. IMPRESSION: 1. Intensely hypermetabolic 4.0 x 3.3 x 6.3 cm anal mass, compatible with known primary anal carcinoma. 2.  Hypermetabolic bilateral internal iliac nodal metastases. 3. Hypermetabolic left inguinal nodal metastasis. 4. Solitary mildly hypermetabolic nonenlarged left para-aortic lymph node, suspicious for nodal metastasis. 5. No additional  definite sites of hypermetabolic metastatic disease. 6. Nonspecific nonenlarged mildly hypermetabolic bilateral upper neck lymph nodes, statistically most likely to represent benign reactive nodes. 7. Mild paraseptal emphysema . Electronically Signed   By: Ilona Sorrel M.D.   On: 12/13/2016 11:13   Ir Fluoro Guide Cv Line Right  Result Date: 12/16/2016 CLINICAL DATA:  Anal carcinoma, needs durable venous access for chemotherapy regimen EXAM: PICC PLACEMENT WITH ULTRASOUND AND FLUOROSCOPY FLUOROSCOPY TIME:  6 sec (2 mGy) TECHNIQUE: After written informed consent was obtained, patient was placed in the supine position on angiographic table. Patency of the right basilic vein was confirmed with ultrasound with image documentation. An appropriate skin site was determined. Skin site was marked. Region was prepped using maximum barrier technique including cap and mask, sterile gown, sterile gloves, large sterile sheet, and Chlorhexidine as cutaneous antisepsis. The region was infiltrated locally with 1% lidocaine. Under real-time ultrasound guidance, the right basilic vein was accessed with a 21 gauge micropuncture needle; the needle tip within the vein was confirmed with ultrasound image documentation. Needle exchanged over a 018 guidewire for a peel-away sheath, through which a 5-French single-lumen power injectable PICC trimmed to 46cm was advanced, positioned with its tip near the cavoatrial junction. Spot chest radiograph confirms appropriate catheter position. Catheter was flushed per protocol and secured externally. The patient tolerated procedure well. COMPLICATIONS: COMPLICATIONS none IMPRESSION: 1. Technically successful five [place] single lumen power injectable PICC placement  Electronically Signed   By: Lucrezia Europe M.D.   On: 12/16/2016 09:00   Ir US Guide Vasc Access Right  Result Date: 12/16/2016 CLINICAL DATA:  Anal carcinoma, needs durable venous access for chemotherapy regimen EXAM: PICC PLACEMENT WITH ULTRASOUND AND FLUOROSCOPY FLUOROSCOPY TIME:  6 sec (2 mGy) TECHNIQUE: After written informed consent was obtained, patient was placed in the supine position on angiographic table. Patency of the right basilic vein was confirmed with ultrasound with image documentation. An appropriate skin site was determined. Skin site was marked. Region was prepped using maximum barrier technique including cap and mask, sterile gown, sterile gloves, large sterile sheet, and Chlorhexidine as cutaneous antisepsis. The region was infiltrated locally with 1% lidocaine. Under real-time ultrasound guidance, the right basilic vein was accessed with a 21 gauge micropuncture needle; the needle tip within the vein was confirmed with ultrasound image documentation. Needle exchanged over a 018 guidewire for a peel-away sheath, through which a 5-French single-lumen power injectable PICC trimmed to 46cm was advanced, positioned with its tip near the cavoatrial junction. Spot chest radiograph confirms appropriate catheter position. Catheter was flushed per protocol and secured externally. The patient tolerated procedure well. COMPLICATIONS: COMPLICATIONS none IMPRESSION: 1. Technically successful five [place] single lumen power injectable PICC placement Electronically Signed   By: Lucrezia Europe M.D.   On: 12/16/2016 09:00    ASSESSMENT & PLAN:  46 y.o. African-American male, with history of HIV and AIDS, on genvoya since 03/2016,presents with rectal bleeding and pain.  1.  Anal cancer, invasive squamous cell carcinoma,cT2N1aM0, stage IIIA -I previously reviewed his CT scan findings and anal mass biopsy results in details with patient and his sister. -His previous CT scan showed significantly intact left  internal iliac lymph node, highly suspicious for metastasis. No other distant metastasis on CT scan -He has locally advanced anal cancer, I discussed the standard therapy with concurrent chemotherapy and radiation. We usually preserve surgery for residual cancer after chemotherapy and radiation, or recurrent disease. -We previously discussed that his anal cancer will likely be cured by  concurrent chemotherapy and radiation, although his HIV and low CD4 count may predicts high risk for chemoradiation, medications, and poor prognosis for his anal cancer. -I previously discussed the standard chemotherapy regiment with mitomycin on Day 1 and 5-FU infusion day 1-5 (96 hours) -The goal of therapy is curative -He overall tolerated the first cycle chemotherapy well, did not have severe cytopenias. -Lab reviewed, adequate for treatment, plan to start cycle 2 chemotherapy next Monday. -will continue lab weekly  -I have called in Compazine and Zofran for nausea as needed -He starts cycle 2 of chemo next Monday. His pick line is still in place, we'll change the dressing.  -We'll remove his PICC line after he completes chemotherapy next week.  2. Rectal bleeding and pain  -Secondary to his anal cancer and radiation dermatitis.  -He takes oxycodone as needed for his rectal pain, but his pain has been worse. -Taking MS Contin 15mg  q12h   3. HIV/AIDS -His treatment was switched to genvoya in 05/2016, his CD4 has improved since then, but overall still low -continue HIV therapy when on chemo, will check with Dr. Johnnye Harrison    4. Social support  -His sister is involved with his care -Social worker will meet the today to help him to arrange this transportation etc.  Plan -Cycle 2 chemo mitomycin and 5-FU Monday, full dose  -My nurse will change his PICC line dressing -please remove his PICC line next Friday after he completes chemo  -He will see me again the week after his 2nd cycle of chemo.   All questions  were answered. The patient knows to call the clinic with any problems, questions or concerns.  I spent 20 minutes counseling the patient face to face. The total time spent in the appointment was 25 minutes and more than 50% was on counseling.  This document serves as a record of services personally performed by Juan Merle, MD. It was created on her behalf by Martinique Casey, a trained medical scribe. The creation of this record is based on the scribe's personal observations and the provider's statements to them. This document has been checked and approved by the attending provider.  I have reviewed the above documentation for accuracy and completeness and I agree with the above.   Juan Merle, MD 01/07/2017

## 2017-01-02 NOTE — Telephone Encounter (Signed)
Called sister [name] and spoke with her,gave her status of patient skin breakdown, to use silvadene on  Affected breakdown , and to reinforce to take his pain meds morphine MS contin bid, and Oxycodone as needed for breakthrough pain,  She will make sure she reinforces patient to use his medications,  12:15 PM

## 2017-01-03 ENCOUNTER — Ambulatory Visit: Payer: Medicare Other

## 2017-01-03 ENCOUNTER — Ambulatory Visit
Admission: RE | Admit: 2017-01-03 | Discharge: 2017-01-03 | Disposition: A | Discharge status: Home / Self Care | Payer: Medicare Other | Source: Ambulatory Visit | Attending: Radiation Oncology | Admitting: Radiation Oncology

## 2017-01-03 DIAGNOSIS — Z510 Encounter for antineoplastic radiation therapy: Secondary | ICD-10-CM | POA: Diagnosis not present

## 2017-01-03 DIAGNOSIS — B20 Human immunodeficiency virus [HIV] disease: Secondary | ICD-10-CM | POA: Diagnosis not present

## 2017-01-03 DIAGNOSIS — C210 Malignant neoplasm of anus, unspecified: Secondary | ICD-10-CM | POA: Diagnosis not present

## 2017-01-03 DIAGNOSIS — F17210 Nicotine dependence, cigarettes, uncomplicated: Secondary | ICD-10-CM | POA: Diagnosis not present

## 2017-01-06 ENCOUNTER — Encounter: Payer: Self-pay | Admitting: Radiation Oncology

## 2017-01-06 ENCOUNTER — Ambulatory Visit: Payer: Medicare Other

## 2017-01-06 ENCOUNTER — Ambulatory Visit
Admission: RE | Admit: 2017-01-06 | Discharge: 2017-01-06 | Disposition: A | Discharge status: Home / Self Care | Payer: Medicare Other | Source: Ambulatory Visit | Attending: Radiation Oncology | Admitting: Radiation Oncology

## 2017-01-06 ENCOUNTER — Other Ambulatory Visit (HOSPITAL_BASED_OUTPATIENT_CLINIC_OR_DEPARTMENT_OTHER): Payer: Medicare Other

## 2017-01-06 VITALS — BP 95/64 | HR 80 | Temp 97.7°F | Resp 20 | Wt 144.4 lb

## 2017-01-06 DIAGNOSIS — F17210 Nicotine dependence, cigarettes, uncomplicated: Secondary | ICD-10-CM | POA: Diagnosis not present

## 2017-01-06 DIAGNOSIS — C210 Malignant neoplasm of anus, unspecified: Secondary | ICD-10-CM

## 2017-01-06 DIAGNOSIS — Z510 Encounter for antineoplastic radiation therapy: Secondary | ICD-10-CM | POA: Diagnosis not present

## 2017-01-06 DIAGNOSIS — B20 Human immunodeficiency virus [HIV] disease: Secondary | ICD-10-CM | POA: Diagnosis not present

## 2017-01-06 LAB — COMPREHENSIVE METABOLIC PANEL
ALT: 29 U/L (ref 0–55)
ANION GAP: 7 meq/L (ref 3–11)
AST: 15 U/L (ref 5–34)
Albumin: 3.2 g/dL — ABNORMAL LOW (ref 3.5–5.0)
Alkaline Phosphatase: 75 U/L (ref 40–150)
BUN: 9.8 mg/dL (ref 7.0–26.0)
CALCIUM: 9.4 mg/dL (ref 8.4–10.4)
CHLORIDE: 103 meq/L (ref 98–109)
CO2: 28 mEq/L (ref 22–29)
CREATININE: 1 mg/dL (ref 0.7–1.3)
Glucose: 118 mg/dl (ref 70–140)
POTASSIUM: 3.9 meq/L (ref 3.5–5.1)
Sodium: 138 mEq/L (ref 136–145)
Total Bilirubin: <0.22 mg/dL (ref 0.20–1.20)
Total Protein: 7.3 g/dL (ref 6.4–8.3)

## 2017-01-06 LAB — CBC WITH DIFFERENTIAL/PLATELET
BASO%: 1.1 % (ref 0.0–2.0)
BASOS ABS: 0 10*3/uL (ref 0.0–0.1)
EOS%: 7.2 % — ABNORMAL HIGH (ref 0.0–7.0)
Eosinophils Absolute: 0.3 10*3/uL (ref 0.0–0.5)
HEMATOCRIT: 36.5 % — AB (ref 38.4–49.9)
HGB: 12.1 g/dL — ABNORMAL LOW (ref 13.0–17.1)
LYMPH#: 1 10*3/uL (ref 0.9–3.3)
LYMPH%: 28.1 % (ref 14.0–49.0)
MCH: 28.8 pg (ref 27.2–33.4)
MCHC: 33.2 g/dL (ref 32.0–36.0)
MCV: 86.9 fL (ref 79.3–98.0)
MONO#: 0.4 10*3/uL (ref 0.1–0.9)
MONO%: 10 % (ref 0.0–14.0)
NEUT#: 1.9 10*3/uL (ref 1.5–6.5)
NEUT%: 53.6 % (ref 39.0–75.0)
PLATELETS: 125 10*3/uL — AB (ref 140–400)
RBC: 4.2 10*6/uL (ref 4.20–5.82)
RDW: 13.9 % (ref 11.0–14.6)
WBC: 3.6 10*3/uL — ABNORMAL LOW (ref 4.0–10.3)

## 2017-01-06 NOTE — Progress Notes (Signed)
Department of Radiation Oncology  Phone:  [phone] Fax:        [phone]  Weekly Treatment Note    Name: Juan Harrison Date: 01/06/2017 MRN: [id number] DOB: 04-Feb-1971   Diagnosis:   No diagnosis found.   Current dose: 27 Gy  Current fraction: 15   MEDICATIONS: Current Outpatient Prescriptions  Medication Sig Dispense Refill  . acetaminophen (TYLENOL) 500 MG tablet Take 500 mg by mouth every 6 (six) hours as needed.    . elvitegravir-cobicistat-emtricitabine-tenofovir (GENVOYA) 150-150-200-10 MG TABS tablet Take 1 tablet by mouth daily with breakfast. 90 tablet 3  . ibuprofen (ADVIL,MOTRIN) 200 MG tablet Take 200 mg by mouth as needed.    Marland Kitchen morphine (MS CONTIN) 15 MG 12 hr tablet Take 1 tablet (15 mg total) by mouth every 12 (twelve) hours. 30 tablet 0  . oxyCODONE (OXY IR/ROXICODONE) 5 MG immediate release tablet Take 1 tablet (5 mg total) by mouth every 4 (four) hours as needed. 60 tablet 0  . prochlorperazine (COMPAZINE) 10 MG tablet Take 1 tablet (10 mg total) by mouth every 6 (six) hours as needed (Nausea or vomiting). 30 tablet 1  . silver sulfADIAZINE (SILVADENE) 1 % cream Apply 1 application topically 2 (two) times daily. Apply a thin layer twice daily to affected area.    . valACYclovir (VALTREX) 1000 MG tablet Take 1 tablet (1,000 mg total) by mouth 2 (two) times daily. 20 tablet 0  . ondansetron (ZOFRAN) 8 MG tablet Take 1 tablet (8 mg total) by mouth 2 (two) times daily as needed (Nausea or vomiting). (Patient not taking: Reported on 01/06/2017) 30 tablet 1  . polyethylene glycol powder (MIRALAX) powder Take 17 g by mouth daily. (Patient not taking: Reported on 01/06/2017) 255 g 1   No current facility-administered medications for this encounter.      ALLERGIES: Patient has no known allergies.   LABORATORY DATA:  Lab Results  Component Value Date   WBC 2.0 (L) 12/30/2016   HGB 12.8 (L) 12/30/2016   HCT 38.4 12/30/2016   MCV 87.4 12/30/2016   PLT  81 (L) 12/30/2016   Lab Results  Component Value Date   NA 139 12/30/2016   K 3.8 12/30/2016   CL 102 10/29/2016   CO2 27 12/30/2016   Lab Results  Component Value Date   ALT 35 12/30/2016   AST 17 12/30/2016   ALKPHOS 85 12/30/2016   BILITOT 0.33 12/30/2016     NARRATIVE: [name] was seen today for weekly treatment management. The chart was checked and the patient's films were reviewed.  Weekly rad txs anal. The patient has completed 15 fractions so far and presents for a skin check. He notes moist desquamation in between his buttocks and right sided testicle dry desquamation. He is using the silvadene daily. He states it has helped. His pain is a 5/10 this morning and he feels better. He has a good appetite.  9:12 AM BP 95/64 (BP Location: Left Arm, Patient Position: Sitting, Cuff Size: Normal)   Pulse 80   Temp 97.7 F (36.5 C) (Oral)   Resp 20   Wt 144 lb 6.4 oz (65.5 kg)   BMI 18.05 kg/m   Wt Readings from Last 3 Encounters:  01/06/17 144 lb 6.4 oz (65.5 kg)  01/01/17 145 lb 12.8 oz (66.1 kg)  12/30/16 142 lb 11.2 oz (64.7 kg)   PHYSICAL EXAMINATION: weight is 144 lb 6.4 oz (65.5 kg). His oral temperature is 97.7 F (36.5 C). His blood  pressure is 95/64 and his pulse is 80. His respiration is 20.   Alert and oriented. In no distress. Moist desquamation in the perianal region. No sign of infection.  ASSESSMENT: Patient is having a fairly brisk skin reaction, but he suitable to continue with treatment at this time. We will continue to monitor his skin closely.  PLAN: We will continue with the patient's radiation treatment as planned.  ------------------------------------------------  Juan Gross, MD, PhD  This document serves as a record of services personally performed by Kyung Rudd, MD. It was created on his behalf by Darcus Austin, a trained medical scribe. The creation of this record is based on the scribe's personal observations and the provider's  statements to them. This document has been checked and approved by the attending provider.

## 2017-01-06 NOTE — Progress Notes (Signed)
Weekly rad txs anal  Has had 14 tx s so far, here for skin check, moist desquamation in between but ocks, on right side testicle dry desquamation,  Using the silvadene  Daily, stated It has helped ", pain 5/10 scale this am, appetite good,  Feels better no moaning or grimacing 9:09 AM BP 95/64 (BP Location: Left Arm, Patient Position: Sitting, Cuff Size: Normal)   Pulse 80   Temp 97.7 F (36.5 C) (Oral)   Resp 20   Wt 144 lb 6.4 oz (65.5 kg)   BMI 18.05 kg/m   Wt Readings from Last 3 Encounters:  01/06/17 144 lb 6.4 oz (65.5 kg)  01/01/17 145 lb 12.8 oz (66.1 kg)  12/30/16 142 lb 11.2 oz (64.7 kg)

## 2017-01-07 ENCOUNTER — Ambulatory Visit (HOSPITAL_BASED_OUTPATIENT_CLINIC_OR_DEPARTMENT_OTHER): Payer: Medicare Other | Admitting: Hematology

## 2017-01-07 ENCOUNTER — Encounter: Payer: Self-pay | Admitting: Radiation Oncology

## 2017-01-07 ENCOUNTER — Ambulatory Visit (HOSPITAL_BASED_OUTPATIENT_CLINIC_OR_DEPARTMENT_OTHER): Payer: Medicare Other

## 2017-01-07 ENCOUNTER — Ambulatory Visit
Admission: RE | Admit: 2017-01-07 | Discharge: 2017-01-07 | Disposition: A | Discharge status: Home / Self Care | Payer: Medicare Other | Source: Ambulatory Visit | Attending: Radiation Oncology | Admitting: Radiation Oncology

## 2017-01-07 ENCOUNTER — Ambulatory Visit: Payer: Medicare Other

## 2017-01-07 VITALS — BP 112/60 | HR 73 | Temp 98.0°F | Resp 18 | Ht 75.0 in | Wt 146.9 lb

## 2017-01-07 DIAGNOSIS — Z452 Encounter for adjustment and management of vascular access device: Secondary | ICD-10-CM

## 2017-01-07 DIAGNOSIS — G893 Neoplasm related pain (acute) (chronic): Secondary | ICD-10-CM | POA: Diagnosis not present

## 2017-01-07 DIAGNOSIS — K625 Hemorrhage of anus and rectum: Secondary | ICD-10-CM | POA: Diagnosis not present

## 2017-01-07 DIAGNOSIS — C210 Malignant neoplasm of anus, unspecified: Secondary | ICD-10-CM

## 2017-01-07 DIAGNOSIS — B20 Human immunodeficiency virus [HIV] disease: Secondary | ICD-10-CM

## 2017-01-07 DIAGNOSIS — A630 Anogenital (venereal) warts: Secondary | ICD-10-CM

## 2017-01-07 DIAGNOSIS — F17210 Nicotine dependence, cigarettes, uncomplicated: Secondary | ICD-10-CM | POA: Diagnosis not present

## 2017-01-07 DIAGNOSIS — Z510 Encounter for antineoplastic radiation therapy: Secondary | ICD-10-CM | POA: Diagnosis not present

## 2017-01-07 DIAGNOSIS — Z789 Other specified health status: Secondary | ICD-10-CM

## 2017-01-07 LAB — COMPREHENSIVE METABOLIC PANEL
ALT: 25 U/L (ref 0–55)
AST: 14 U/L (ref 5–34)
Albumin: 3.1 g/dL — ABNORMAL LOW (ref 3.5–5.0)
Alkaline Phosphatase: 73 U/L (ref 40–150)
Anion Gap: 7 mEq/L (ref 3–11)
BUN: 9.2 mg/dL (ref 7.0–26.0)
CO2: 28 meq/L (ref 22–29)
Calcium: 9.1 mg/dL (ref 8.4–10.4)
Chloride: 104 mEq/L (ref 98–109)
Creatinine: 1.1 mg/dL (ref 0.7–1.3)
GLUCOSE: 111 mg/dL (ref 70–140)
POTASSIUM: 3.7 meq/L (ref 3.5–5.1)
SODIUM: 139 meq/L (ref 136–145)
Total Protein: 7.1 g/dL (ref 6.4–8.3)

## 2017-01-07 LAB — CBC WITH DIFFERENTIAL/PLATELET
BASO%: 0.2 % (ref 0.0–2.0)
Basophils Absolute: 0 10*3/uL (ref 0.0–0.1)
EOS ABS: 0.7 10*3/uL — AB (ref 0.0–0.5)
EOS%: 14.4 % — AB (ref 0.0–7.0)
HCT: 35.5 % — ABNORMAL LOW (ref 38.4–49.9)
HEMOGLOBIN: 11.9 g/dL — AB (ref 13.0–17.1)
LYMPH%: 22 % (ref 14.0–49.0)
MCH: 29.5 pg (ref 27.2–33.4)
MCHC: 33.5 g/dL (ref 32.0–36.0)
MCV: 88.1 fL (ref 79.3–98.0)
MONO#: 0.5 10*3/uL (ref 0.1–0.9)
MONO%: 10.9 % (ref 0.0–14.0)
NEUT#: 2.6 10*3/uL (ref 1.5–6.5)
NEUT%: 52.5 % (ref 39.0–75.0)
Platelets: 170 10*3/uL (ref 140–400)
RBC: 4.03 10*6/uL — AB (ref 4.20–5.82)
RDW: 14 % (ref 11.0–14.6)
WBC: 5 10*3/uL (ref 4.0–10.3)
lymph#: 1.1 10*3/uL (ref 0.9–3.3)

## 2017-01-07 MED ORDER — OXYCODONE-ACETAMINOPHEN 5-325 MG PO TABS
1.0000 | ORAL_TABLET | Freq: Once | ORAL | Status: AC
  Administered 2017-01-07: 1 via ORAL
  Filled 2017-01-07: qty 1

## 2017-01-07 MED ORDER — OXYCODONE-ACETAMINOPHEN 5-325 MG PO TABS: 1.0000 | ORAL_TABLET | Freq: Once | ORAL | 0 refills | Status: DC

## 2017-01-07 MED ORDER — SODIUM CHLORIDE 0.9% FLUSH
10.0000 mL | INTRAVENOUS | Status: DC | PRN
  Administered 2017-01-07: 10 mL via INTRAVENOUS
  Filled 2017-01-07: qty 10

## 2017-01-07 MED ORDER — HEPARIN SOD (PORK) LOCK FLUSH 100 UNIT/ML IV SOLN
500.0000 [IU] | Freq: Once | INTRAVENOUS | Status: AC | PRN
  Administered 2017-01-07: 250 [IU] via INTRAVENOUS
  Filled 2017-01-07: qty 5

## 2017-01-07 NOTE — Progress Notes (Signed)
The patient was seen at the limit was seen today after receiving radiotherapy. He continues to have skin breakdown is using Silvadene. He is using oxycodone twice a day and taking morphine extended release twice a day. We discussed the importance of having better pain control through the day and would suggest oxycodone B every 4-6 hours as needed. A dose of Percocet was administered in the clinic to the patient. His skin continues to be irritated, and as above he is using Silvadene. He does have skin breakdown as visualized below. We will discuss with Dr. Burr Medico and likely have his CD4 counts rechecked in the near future.        Carola Rhine, PAC

## 2017-01-07 NOTE — Progress Notes (Signed)
Per [employer] verbal order to give patient 1 percocet 5/325mg   Oral x 1 now, went to dressing room 3,  Patient gave name and dob as identification, gave 1 tablet percocet 5/325mg   Oral x 1 and glass water, pain "pulling anal" stated, hard toly down stated patient 2:28 PM

## 2017-01-08 ENCOUNTER — Ambulatory Visit: Payer: Medicare Other

## 2017-01-08 ENCOUNTER — Ambulatory Visit
Admission: RE | Admit: 2017-01-08 | Discharge: 2017-01-08 | Disposition: A | Discharge status: Home / Self Care | Payer: Medicare Other | Source: Ambulatory Visit | Attending: Radiation Oncology | Admitting: Radiation Oncology

## 2017-01-08 DIAGNOSIS — B20 Human immunodeficiency virus [HIV] disease: Secondary | ICD-10-CM | POA: Diagnosis not present

## 2017-01-08 DIAGNOSIS — Z510 Encounter for antineoplastic radiation therapy: Secondary | ICD-10-CM | POA: Diagnosis not present

## 2017-01-08 DIAGNOSIS — C210 Malignant neoplasm of anus, unspecified: Secondary | ICD-10-CM | POA: Diagnosis not present

## 2017-01-08 DIAGNOSIS — F17210 Nicotine dependence, cigarettes, uncomplicated: Secondary | ICD-10-CM | POA: Diagnosis not present

## 2017-01-09 ENCOUNTER — Ambulatory Visit: Payer: Medicare Other

## 2017-01-09 ENCOUNTER — Ambulatory Visit
Admission: RE | Admit: 2017-01-09 | Discharge: 2017-01-09 | Disposition: A | Discharge status: Home / Self Care | Payer: Medicare Other | Source: Ambulatory Visit | Attending: Radiation Oncology | Admitting: Radiation Oncology

## 2017-01-09 DIAGNOSIS — B20 Human immunodeficiency virus [HIV] disease: Secondary | ICD-10-CM | POA: Diagnosis not present

## 2017-01-09 DIAGNOSIS — Z510 Encounter for antineoplastic radiation therapy: Secondary | ICD-10-CM | POA: Diagnosis not present

## 2017-01-09 DIAGNOSIS — F17210 Nicotine dependence, cigarettes, uncomplicated: Secondary | ICD-10-CM | POA: Diagnosis not present

## 2017-01-09 DIAGNOSIS — C210 Malignant neoplasm of anus, unspecified: Secondary | ICD-10-CM | POA: Diagnosis not present

## 2017-01-10 ENCOUNTER — Encounter: Payer: Self-pay | Admitting: Radiation Oncology

## 2017-01-10 ENCOUNTER — Ambulatory Visit: Payer: Medicare Other

## 2017-01-10 ENCOUNTER — Ambulatory Visit
Admission: RE | Admit: 2017-01-10 | Discharge: 2017-01-10 | Disposition: A | Discharge status: Home / Self Care | Payer: Medicare Other | Source: Ambulatory Visit | Attending: Radiation Oncology | Admitting: Radiation Oncology

## 2017-01-10 VITALS — BP 99/66 | HR 78 | Temp 98.6°F | Resp 20 | Wt 147.8 lb

## 2017-01-10 DIAGNOSIS — C210 Malignant neoplasm of anus, unspecified: Secondary | ICD-10-CM | POA: Diagnosis not present

## 2017-01-10 DIAGNOSIS — B20 Human immunodeficiency virus [HIV] disease: Secondary | ICD-10-CM | POA: Diagnosis not present

## 2017-01-10 DIAGNOSIS — F17210 Nicotine dependence, cigarettes, uncomplicated: Secondary | ICD-10-CM | POA: Diagnosis not present

## 2017-01-10 DIAGNOSIS — Z510 Encounter for antineoplastic radiation therapy: Secondary | ICD-10-CM | POA: Diagnosis not present

## 2017-01-10 MED ORDER — SILVER SULFADIAZINE 1 % EX CREA
TOPICAL_CREAM | Freq: Every day | CUTANEOUS | Status: DC
  Administered 2017-01-10: 10:00:00 via TOPICAL

## 2017-01-10 NOTE — Progress Notes (Signed)
Weekly rad txs anal 19/30 completed, bottom still moist desquamation but is dryer than last week, using silvadene daily after rad txs, gave another jar, also gave patient a box of imodium, he has had 2 diarrheas this am and had run out of that medication, also not taking his ms contin  This morning  As yet , pain is a 5/10 now, will take when he gets home, appetite is good 10:59 AM BP 99/66 (BP Location: Left Arm, Patient Position: Sitting, Cuff Size: Normal)   Pulse 78   Temp 98.6 F (37 C) (Oral)   Resp 20   Wt 147 lb 12.8 oz (67 kg)   BMI 18.47 kg/m   Wt Readings from Last 3 Encounters:  01/10/17 147 lb 12.8 oz (67 kg)  01/07/17 146 lb 14.4 oz (66.6 kg)  01/06/17 144 lb 6.4 oz (65.5 kg)

## 2017-01-10 NOTE — Progress Notes (Signed)
Department of Radiation Oncology  Phone:  [phone] Fax:        [phone]  Weekly Treatment Note    Name: Juan Harrison Date: 01/10/2017 MRN: [id number] DOB: January 16, 1971   Diagnosis:     ICD-9-CM ICD-10-CM   1. Anal cancer (HCC) 154.3 C21.0 silver sulfADIAZINE (SILVADENE) 1 % cream     Current dose: 34.2 Gy  Current fraction: 19   MEDICATIONS: Current Outpatient Prescriptions  Medication Sig Dispense Refill  . acetaminophen (TYLENOL) 500 MG tablet Take 500 mg by mouth every 6 (six) hours as needed.    . elvitegravir-cobicistat-emtricitabine-tenofovir (GENVOYA) 150-150-200-10 MG TABS tablet Take 1 tablet by mouth daily with breakfast. 90 tablet 3  . ibuprofen (ADVIL,MOTRIN) 200 MG tablet Take 200 mg by mouth as needed.    Marland Kitchen morphine (MS CONTIN) 15 MG 12 hr tablet Take 1 tablet (15 mg total) by mouth every 12 (twelve) hours. 30 tablet 0  . ondansetron (ZOFRAN) 8 MG tablet Take 1 tablet (8 mg total) by mouth 2 (two) times daily as needed (Nausea or vomiting). 30 tablet 1  . oxyCODONE (OXY IR/ROXICODONE) 5 MG immediate release tablet Take 1 tablet (5 mg total) by mouth every 4 (four) hours as needed. 60 tablet 0  . polyethylene glycol powder (MIRALAX) powder Take 17 g by mouth daily. 255 g 1  . prochlorperazine (COMPAZINE) 10 MG tablet Take 1 tablet (10 mg total) by mouth every 6 (six) hours as needed (Nausea or vomiting). 30 tablet 1  . silver sulfADIAZINE (SILVADENE) 1 % cream Apply 1 application topically 2 (two) times daily. Apply a thin layer twice daily to affected area.    . valACYclovir (VALTREX) 1000 MG tablet Take 1 tablet (1,000 mg total) by mouth 2 (two) times daily. 20 tablet 0   Current Facility-Administered Medications  Medication Dose Route Frequency Provider Last Rate Last Dose  . silver sulfADIAZINE (SILVADENE) 1 % cream   Topical Daily Hayden Pedro, PA-C         ALLERGIES: Patient has no known allergies.   LABORATORY DATA:  Lab Results   Component Value Date   WBC 5.0 01/07/2017   HGB 11.9 (L) 01/07/2017   HCT 35.5 (L) 01/07/2017   MCV 88.1 01/07/2017   PLT 170 01/07/2017   Lab Results  Component Value Date   NA 139 01/07/2017   K 3.7 01/07/2017   CL 102 10/29/2016   CO2 28 01/07/2017   Lab Results  Component Value Date   ALT 25 01/07/2017   AST 14 01/07/2017   ALKPHOS 73 01/07/2017   BILITOT <0.22 01/07/2017     NARRATIVE: Juan Harrison was seen today for weekly treatment management. The chart was checked and the patient's films were reviewed.  The patient states he is doing better today he does continue to feel that Silvadene ointment is helping significantly. Discussed with him again today the use of pain medication. He has not taken anything this morning.  Weekly rad txs anal 19/30 completed, bottom still moist desquamation but is dryer than last week, using silvadene daily after rad txs, gave another jar, also gave patient a box of imodium, he has had 2 diarrheas this am and had run out of that medication, also not taking his ms contin  This morning  As yet , pain is a 5/10 now, will take when he gets home, appetite is good 11:08 AM BP 99/66 (BP Location: Left Arm, Patient Position: Sitting, Cuff Size: Normal)   Pulse 78  Temp 98.6 F (37 C) (Oral)   Resp 20   Wt 147 lb 12.8 oz (67 kg)   BMI 18.47 kg/m   Wt Readings from Last 3 Encounters:  01/10/17 147 lb 12.8 oz (67 kg)  01/07/17 146 lb 14.4 oz (66.6 kg)  01/06/17 144 lb 6.4 oz (65.5 kg)    PHYSICAL EXAMINATION: weight is 147 lb 12.8 oz (67 kg). His oral temperature is 98.6 F (37 C). His blood pressure is 99/66 and his pulse is 78. His respiration is 20.      Moist desquamation in the perianal region although this does look somewhat improved as compared to his last visit.  ASSESSMENT: The patient is doing satisfactorily with treatment.  PLAN: We will continue with the patient's radiation treatment as planned.

## 2017-01-11 ENCOUNTER — Encounter: Payer: Self-pay | Admitting: Hematology

## 2017-01-11 ENCOUNTER — Telehealth: Payer: Self-pay | Admitting: Hematology

## 2017-01-11 NOTE — Telephone Encounter (Signed)
Appointments scheduled per 2/10 LOS. Patient notified.

## 2017-01-13 ENCOUNTER — Other Ambulatory Visit: Payer: Medicare Other

## 2017-01-13 ENCOUNTER — Ambulatory Visit: Payer: Medicare Other

## 2017-01-13 ENCOUNTER — Ambulatory Visit: Payer: Medicare Other | Admitting: Nurse Practitioner

## 2017-01-13 ENCOUNTER — Ambulatory Visit
Admission: RE | Admit: 2017-01-13 | Discharge: 2017-01-13 | Disposition: A | Discharge status: Home / Self Care | Payer: Medicare Other | Source: Ambulatory Visit | Attending: Radiation Oncology | Admitting: Radiation Oncology

## 2017-01-13 DIAGNOSIS — C210 Malignant neoplasm of anus, unspecified: Secondary | ICD-10-CM | POA: Diagnosis not present

## 2017-01-13 DIAGNOSIS — F17210 Nicotine dependence, cigarettes, uncomplicated: Secondary | ICD-10-CM | POA: Diagnosis not present

## 2017-01-13 DIAGNOSIS — B20 Human immunodeficiency virus [HIV] disease: Secondary | ICD-10-CM | POA: Diagnosis not present

## 2017-01-13 DIAGNOSIS — Z510 Encounter for antineoplastic radiation therapy: Secondary | ICD-10-CM | POA: Diagnosis not present

## 2017-01-14 ENCOUNTER — Telehealth: Payer: Self-pay | Admitting: *Deleted

## 2017-01-14 ENCOUNTER — Ambulatory Visit: Payer: Medicare Other

## 2017-01-14 ENCOUNTER — Other Ambulatory Visit: Payer: Self-pay | Admitting: Radiation Oncology

## 2017-01-14 ENCOUNTER — Ambulatory Visit
Admission: RE | Admit: 2017-01-14 | Discharge: 2017-01-14 | Disposition: A | Discharge status: Home / Self Care | Payer: Medicare Other | Source: Ambulatory Visit | Attending: Radiation Oncology | Admitting: Radiation Oncology

## 2017-01-14 DIAGNOSIS — F17210 Nicotine dependence, cigarettes, uncomplicated: Secondary | ICD-10-CM | POA: Diagnosis not present

## 2017-01-14 DIAGNOSIS — B20 Human immunodeficiency virus [HIV] disease: Secondary | ICD-10-CM | POA: Diagnosis not present

## 2017-01-14 DIAGNOSIS — Z510 Encounter for antineoplastic radiation therapy: Secondary | ICD-10-CM | POA: Diagnosis not present

## 2017-01-14 DIAGNOSIS — C210 Malignant neoplasm of anus, unspecified: Secondary | ICD-10-CM

## 2017-01-14 MED ORDER — MORPHINE SULFATE ER 15 MG PO TBCR: 15.0000 mg | EXTENDED_RELEASE_TABLET | Freq: Two times a day (BID) | ORAL | 0 refills | Status: DC

## 2017-01-14 MED ORDER — OXYCODONE HCL 5 MG PO TABS: 5.0000 mg | ORAL_TABLET | ORAL | 0 refills | Status: DC | PRN

## 2017-01-14 NOTE — Telephone Encounter (Signed)
Per 2/13 LOS I have scheduled appts, called and left patient a voicemail with date/time.

## 2017-01-15 ENCOUNTER — Ambulatory Visit: Payer: Medicare Other

## 2017-01-15 ENCOUNTER — Ambulatory Visit
Admission: RE | Admit: 2017-01-15 | Discharge: 2017-01-15 | Disposition: A | Discharge status: Home / Self Care | Payer: Medicare Other | Source: Ambulatory Visit | Attending: Radiation Oncology | Admitting: Radiation Oncology

## 2017-01-15 ENCOUNTER — Ambulatory Visit (HOSPITAL_BASED_OUTPATIENT_CLINIC_OR_DEPARTMENT_OTHER): Payer: Medicare Other

## 2017-01-15 DIAGNOSIS — Z452 Encounter for adjustment and management of vascular access device: Secondary | ICD-10-CM

## 2017-01-15 DIAGNOSIS — C210 Malignant neoplasm of anus, unspecified: Secondary | ICD-10-CM

## 2017-01-15 DIAGNOSIS — Z510 Encounter for antineoplastic radiation therapy: Secondary | ICD-10-CM | POA: Diagnosis not present

## 2017-01-15 DIAGNOSIS — B20 Human immunodeficiency virus [HIV] disease: Secondary | ICD-10-CM | POA: Diagnosis not present

## 2017-01-15 DIAGNOSIS — F17210 Nicotine dependence, cigarettes, uncomplicated: Secondary | ICD-10-CM | POA: Diagnosis not present

## 2017-01-15 MED ORDER — SODIUM CHLORIDE 0.9% FLUSH
10.0000 mL | INTRAVENOUS | Status: DC | PRN
  Administered 2017-01-15: 10 mL via INTRAVENOUS
  Filled 2017-01-15: qty 10

## 2017-01-15 MED ORDER — HEPARIN SOD (PORK) LOCK FLUSH 100 UNIT/ML IV SOLN
500.0000 [IU] | Freq: Once | INTRAVENOUS | Status: AC | PRN
  Administered 2017-01-15: 500 [IU] via INTRAVENOUS
  Filled 2017-01-15: qty 5

## 2017-01-15 MED FILL — oxyCODONE HCL 5 MG TABS: 5 | 10 days supply | Qty: 60 | Fill #0

## 2017-01-15 MED FILL — MORPHINE SULF ER 15 MG TAB: 15 | 15 days supply | Qty: 30 | Fill #0

## 2017-01-15 NOTE — Patient Instructions (Signed)
Central Lines A central line is a thin tube (catheter) that is put in your vein to give you medicine or food. It may also be used to take blood for lab tests. There are different types of central lines. The type of central line you receive depends on how long it will be needed, your medical condition, and the condition of your veins.  HOME CARE   Follow your doctor's instructions for the type of central line that you have.  Change bandages as told by your doctor. Look for redness or irritation during a bandage change. If a bandage gets wet, have it changed right away.  Keep the area where the central line goes into your skin clean at all times.   Use saline solution to clear out (flush) your central line as told by your doctor.  Always wash your hands before changing bandages or rinsing out the central line.  If the central line accidentally gets pulled on, make sure that:  The bandage is okay.  There is no bleeding.  The line has not been pulled out.   Limit lifting, using your arm, or doing other activity as told by your doctor.   Swim or bathe only if your doctor approves. Your doctor can tell you how to keep your specific type of bandage from getting wet.   GET HELP RIGHT AWAY IF:   You have chills.  You have a fever.   You have shortness of breath or trouble breathing.   You have chest pain.   You feel your heart beating fast or "skipping" beats.   You feel dizzy or faint.  You have bleeding that does not stop from the site of the central line.   You have pain, redness, puffiness (swelling), or drainage at the site of the central line.   You have swelling in your neck, face, chest, or arm on the side of your central line.   Your central line is hard to rinse out.  You do not get a blood return from the central line.  You have a hole or tear in your central line.   Your central line leaks when rinsed out or when fluids are put into it. MAKE  SURE YOU:   Understand these instructions.   Will watch your condition.   Will get help right away if you are not doing well or get worse.  This information is not intended to replace advice given to you by your health care provider. Make sure you discuss any questions you have with your health care provider. Document Released: 11/04/2012 Document Revised: 03/11/2016 Document Reviewed: 11/04/2012 Elsevier Interactive Patient Education  2017 [employer] Inc[employer]  

## 2017-01-16 ENCOUNTER — Ambulatory Visit: Payer: Medicare Other

## 2017-01-16 ENCOUNTER — Ambulatory Visit
Admission: RE | Admit: 2017-01-16 | Discharge: 2017-01-16 | Disposition: A | Discharge status: Home / Self Care | Payer: Medicare Other | Source: Ambulatory Visit | Attending: Radiation Oncology | Admitting: Radiation Oncology

## 2017-01-16 DIAGNOSIS — B20 Human immunodeficiency virus [HIV] disease: Secondary | ICD-10-CM | POA: Diagnosis not present

## 2017-01-16 DIAGNOSIS — Z510 Encounter for antineoplastic radiation therapy: Secondary | ICD-10-CM | POA: Diagnosis not present

## 2017-01-16 DIAGNOSIS — F17210 Nicotine dependence, cigarettes, uncomplicated: Secondary | ICD-10-CM | POA: Diagnosis not present

## 2017-01-16 DIAGNOSIS — C210 Malignant neoplasm of anus, unspecified: Secondary | ICD-10-CM | POA: Diagnosis not present

## 2017-01-17 ENCOUNTER — Ambulatory Visit
Admission: RE | Admit: 2017-01-17 | Discharge: 2017-01-17 | Disposition: A | Discharge status: Home / Self Care | Payer: Medicare Other | Source: Ambulatory Visit | Attending: Radiation Oncology | Admitting: Radiation Oncology

## 2017-01-17 ENCOUNTER — Ambulatory Visit (HOSPITAL_BASED_OUTPATIENT_CLINIC_OR_DEPARTMENT_OTHER): Payer: Medicare Other

## 2017-01-17 ENCOUNTER — Encounter: Payer: Self-pay | Admitting: Radiation Oncology

## 2017-01-17 ENCOUNTER — Ambulatory Visit: Payer: Medicare Other

## 2017-01-17 VITALS — BP 106/72 | HR 105 | Temp 98.4°F | Resp 18 | Ht 75.0 in | Wt 140.6 lb

## 2017-01-17 DIAGNOSIS — F17210 Nicotine dependence, cigarettes, uncomplicated: Secondary | ICD-10-CM | POA: Diagnosis not present

## 2017-01-17 DIAGNOSIS — Z452 Encounter for adjustment and management of vascular access device: Secondary | ICD-10-CM

## 2017-01-17 DIAGNOSIS — C210 Malignant neoplasm of anus, unspecified: Secondary | ICD-10-CM | POA: Diagnosis not present

## 2017-01-17 DIAGNOSIS — B20 Human immunodeficiency virus [HIV] disease: Secondary | ICD-10-CM | POA: Diagnosis not present

## 2017-01-17 DIAGNOSIS — Z510 Encounter for antineoplastic radiation therapy: Secondary | ICD-10-CM | POA: Diagnosis not present

## 2017-01-17 MED ORDER — HEPARIN SOD (PORK) LOCK FLUSH 100 UNIT/ML IV SOLN
500.0000 [IU] | Freq: Once | INTRAVENOUS | Status: AC | PRN
  Administered 2017-01-17: 250 [IU] via INTRAVENOUS
  Filled 2017-01-17: qty 5

## 2017-01-17 MED ORDER — SODIUM CHLORIDE 0.9% FLUSH
10.0000 mL | INTRAVENOUS | Status: DC | PRN
  Administered 2017-01-17: 10 mL via INTRAVENOUS
  Filled 2017-01-17: qty 10

## 2017-01-17 NOTE — Progress Notes (Addendum)
Weekly rad txs anal 24/30 completed, bottom still dry desquamation but is dryer than last week, using silvadene daily after rad txs, gave another jar,  he has had 1 diarrhea stool this morning, pain is a 5/10 now, will take when he gets home, appetite was decreased over this passed week.   7 pound weight loss over the past week.  Drinking ensure and boost twice a day. Wt Readings from Last 3 Encounters:  01/17/17 140 lb 9.6 oz (63.8 kg)  01/10/17 147 lb 12.8 oz (67 kg)  01/07/17 146 lb 14.4 oz (66.6 kg)  BP (!) 96/59   Pulse 85   Temp 98.4 F (36.9 C) (Oral)   Resp 18   Ht 6\' 3"  (1.905 m)   Wt 140 lb 9.6 oz (63.8 kg)   SpO2 100%   BMI 17.57 kg/m Left arm sitting BP 106/72   Pulse (!) 105   Temp 98.4 F (36.9 C) (Oral)   Resp 18   Ht 6\' 3"  (1.905 m)   Wt 140 lb 9.6 oz (63.8 kg)   SpO2 100%   BMI 17.57 kg/m Left arm standing

## 2017-01-18 NOTE — Progress Notes (Signed)
Department of Radiation Oncology  Phone:  [phone] Fax:        [phone]  Weekly Treatment Note    Name: Juan Harrison Date: 01/18/2017 MRN: [id number] DOB: June 06, 1971   Diagnosis:     ICD-9-CM ICD-10-CM   1. Anal cancer (HCC) 154.3 C21.0      Current dose: 43.2 Gy  Current fraction: 24   MEDICATIONS: Current Outpatient Prescriptions  Medication Sig Dispense Refill  . elvitegravir-cobicistat-emtricitabine-tenofovir (GENVOYA) 150-150-200-10 MG TABS tablet Take 1 tablet by mouth daily with breakfast. 90 tablet 3  . ibuprofen (ADVIL,MOTRIN) 200 MG tablet Take 200 mg by mouth as needed.    Marland Kitchen morphine (MS CONTIN) 15 MG 12 hr tablet Take 1 tablet (15 mg total) by mouth every 12 (twelve) hours. 30 tablet 0  . oxyCODONE (OXY IR/ROXICODONE) 5 MG immediate release tablet Take 1 tablet (5 mg total) by mouth every 4 (four) hours as needed. 60 tablet 0  . polyethylene glycol powder (MIRALAX) powder Take 17 g by mouth daily. 255 g 1  . prochlorperazine (COMPAZINE) 10 MG tablet Take 1 tablet (10 mg total) by mouth every 6 (six) hours as needed (Nausea or vomiting). 30 tablet 1  . silver sulfADIAZINE (SILVADENE) 1 % cream Apply 1 application topically 2 (two) times daily. Apply a thin layer twice daily to affected area.    . valACYclovir (VALTREX) 1000 MG tablet Take 1 tablet (1,000 mg total) by mouth 2 (two) times daily. 20 tablet 0  . acetaminophen (TYLENOL) 500 MG tablet Take 500 mg by mouth every 6 (six) hours as needed.    . ondansetron (ZOFRAN) 8 MG tablet Take 1 tablet (8 mg total) by mouth 2 (two) times daily as needed (Nausea or vomiting). (Patient not taking: Reported on 01/17/2017) 30 tablet 1   No current facility-administered medications for this encounter.      ALLERGIES: Patient has no known allergies.   LABORATORY DATA:  Lab Results  Component Value Date   WBC 5.0 01/07/2017   HGB 11.9 (L) 01/07/2017   HCT 35.5 (L) 01/07/2017   MCV 88.1 01/07/2017   PLT  170 01/07/2017   Lab Results  Component Value Date   NA 139 01/07/2017   K 3.7 01/07/2017   CL 102 10/29/2016   CO2 28 01/07/2017   Lab Results  Component Value Date   ALT 25 01/07/2017   AST 14 01/07/2017   ALKPHOS 73 01/07/2017   BILITOT <0.22 01/07/2017     NARRATIVE: Juan Harrison was seen today for weekly treatment management. The chart was checked and the patient's films were reviewed.  The patient is doing reasonably well. His symptoms really have been stable with some ongoing pain but am pleased that he has been able to continue his treatment without a break. He has 6 more fractions remaining.  Weekly rad txs anal 24/30 completed, bottom still dry desquamation but is dryer than last week, using silvadene daily after rad txs, gave another jar,  he has had 1 diarrhea stool this morning, pain is a 5/10 now, will take when he gets home, appetite was decreased over this passed week.   7 pound weight loss over the past week.  Drinking ensure and boost twice a day. Wt Readings from Last 3 Encounters:  01/17/17 140 lb 9.6 oz (63.8 kg)  01/10/17 147 lb 12.8 oz (67 kg)  01/07/17 146 lb 14.4 oz (66.6 kg)  BP 106/72   Pulse (!) 105   Temp 98.4 F (36.9 C) (  Oral)   Resp 18   Ht 6\' 3"  (1.905 m)   Wt 140 lb 9.6 oz (63.8 kg)   SpO2 100%   BMI 17.57 kg/m Left arm sitting BP 106/72   Pulse (!) 105   Temp 98.4 F (36.9 C) (Oral)   Resp 18   Ht 6\' 3"  (1.905 m)   Wt 140 lb 9.6 oz (63.8 kg)   SpO2 100%   BMI 17.57 kg/m Left arm standing  PHYSICAL EXAMINATION: height is 6\' 3"  (1.905 m) and weight is 140 lb 9.6 oz (63.8 kg). His oral temperature is 98.4 F (36.9 C). His blood pressure is 106/72 and his pulse is 105 (abnormal). His respiration is 18 and oxygen saturation is 100%.        ASSESSMENT: The patient is doing satisfactorily with treatment.  PLAN: We will continue with the patient's radiation treatment as planned.

## 2017-01-20 ENCOUNTER — Ambulatory Visit (HOSPITAL_BASED_OUTPATIENT_CLINIC_OR_DEPARTMENT_OTHER): Payer: Medicare Other

## 2017-01-20 ENCOUNTER — Other Ambulatory Visit (HOSPITAL_BASED_OUTPATIENT_CLINIC_OR_DEPARTMENT_OTHER): Payer: Medicare Other

## 2017-01-20 ENCOUNTER — Ambulatory Visit
Admission: RE | Admit: 2017-01-20 | Discharge: 2017-01-20 | Disposition: A | Discharge status: Home / Self Care | Payer: Medicare Other | Source: Ambulatory Visit | Attending: Radiation Oncology | Admitting: Radiation Oncology

## 2017-01-20 ENCOUNTER — Ambulatory Visit: Payer: Medicare Other

## 2017-01-20 ENCOUNTER — Ambulatory Visit (HOSPITAL_BASED_OUTPATIENT_CLINIC_OR_DEPARTMENT_OTHER): Payer: Medicare Other | Admitting: Nurse Practitioner

## 2017-01-20 VITALS — BP 113/86 | HR 94 | Temp 97.8°F | Resp 16 | Ht 75.0 in | Wt 137.1 lb

## 2017-01-20 DIAGNOSIS — Z5111 Encounter for antineoplastic chemotherapy: Secondary | ICD-10-CM | POA: Diagnosis not present

## 2017-01-20 DIAGNOSIS — R630 Anorexia: Secondary | ICD-10-CM

## 2017-01-20 DIAGNOSIS — Z21 Asymptomatic human immunodeficiency virus [HIV] infection status: Secondary | ICD-10-CM

## 2017-01-20 DIAGNOSIS — C210 Malignant neoplasm of anus, unspecified: Secondary | ICD-10-CM

## 2017-01-20 DIAGNOSIS — Z452 Encounter for adjustment and management of vascular access device: Secondary | ICD-10-CM

## 2017-01-20 DIAGNOSIS — B20 Human immunodeficiency virus [HIV] disease: Secondary | ICD-10-CM | POA: Diagnosis not present

## 2017-01-20 DIAGNOSIS — Z510 Encounter for antineoplastic radiation therapy: Secondary | ICD-10-CM | POA: Diagnosis not present

## 2017-01-20 DIAGNOSIS — F17210 Nicotine dependence, cigarettes, uncomplicated: Secondary | ICD-10-CM | POA: Diagnosis not present

## 2017-01-20 DIAGNOSIS — R634 Abnormal weight loss: Secondary | ICD-10-CM

## 2017-01-20 LAB — CBC WITH DIFFERENTIAL/PLATELET
BASO%: 0.3 % (ref 0.0–2.0)
BASOS ABS: 0 10*3/uL (ref 0.0–0.1)
EOS ABS: 0.9 10*3/uL — AB (ref 0.0–0.5)
EOS%: 24.6 % — AB (ref 0.0–7.0)
HEMATOCRIT: 38.6 % (ref 38.4–49.9)
HGB: 13.2 g/dL (ref 13.0–17.1)
LYMPH#: 0.6 10*3/uL — AB (ref 0.9–3.3)
LYMPH%: 15.6 % (ref 14.0–49.0)
MCH: 29.4 pg (ref 27.2–33.4)
MCHC: 34.2 g/dL (ref 32.0–36.0)
MCV: 86 fL (ref 79.3–98.0)
MONO#: 0.3 10*3/uL (ref 0.1–0.9)
MONO%: 7.1 % (ref 0.0–14.0)
NEUT#: 1.9 10*3/uL (ref 1.5–6.5)
NEUT%: 52.4 % (ref 39.0–75.0)
PLATELETS: 170 10*3/uL (ref 140–400)
RBC: 4.49 10*6/uL (ref 4.20–5.82)
RDW: 14.8 % — ABNORMAL HIGH (ref 11.0–14.6)
WBC: 3.7 10*3/uL — ABNORMAL LOW (ref 4.0–10.3)

## 2017-01-20 LAB — COMPREHENSIVE METABOLIC PANEL
ALBUMIN: 3.5 g/dL (ref 3.5–5.0)
ALK PHOS: 79 U/L (ref 40–150)
ALT: 31 U/L (ref 0–55)
ANION GAP: 10 meq/L (ref 3–11)
AST: 21 U/L (ref 5–34)
BUN: 14 mg/dL (ref 7.0–26.0)
CALCIUM: 10.1 mg/dL (ref 8.4–10.4)
CHLORIDE: 99 meq/L (ref 98–109)
CO2: 27 mEq/L (ref 22–29)
Creatinine: 1.1 mg/dL (ref 0.7–1.3)
Glucose: 137 mg/dl (ref 70–140)
POTASSIUM: 3.4 meq/L — AB (ref 3.5–5.1)
Sodium: 136 mEq/L (ref 136–145)
Total Bilirubin: 0.46 mg/dL (ref 0.20–1.20)
Total Protein: 8.2 g/dL (ref 6.4–8.3)

## 2017-01-20 MED ORDER — PROCHLORPERAZINE MALEATE 10 MG PO TABS
ORAL_TABLET | ORAL | Status: AC
  Filled 2017-01-20: qty 1

## 2017-01-20 MED ORDER — PROCHLORPERAZINE MALEATE 10 MG PO TABS
10.0000 mg | ORAL_TABLET | Freq: Once | ORAL | Status: AC
  Administered 2017-01-20: 10 mg via ORAL

## 2017-01-20 MED ORDER — MITOMYCIN CHEMO IV INJECTION 20 MG
10.0000 mg/m2 | Freq: Once | INTRAVENOUS | Status: AC
  Administered 2017-01-20: 19 mg via INTRAVENOUS
  Filled 2017-01-20: qty 38

## 2017-01-20 MED ORDER — SODIUM CHLORIDE 0.9 % IV SOLN
1000.0000 mg/m2/d | INTRAVENOUS | Status: DC
  Administered 2017-01-20: 7550 mg via INTRAVENOUS
  Filled 2017-01-20: qty 151

## 2017-01-20 MED ORDER — SODIUM CHLORIDE 0.9% FLUSH
10.0000 mL | INTRAVENOUS | Status: DC | PRN
  Administered 2017-01-20: 10 mL via INTRAVENOUS
  Filled 2017-01-20: qty 10

## 2017-01-20 MED ORDER — SODIUM CHLORIDE 0.9 % IV SOLN
Freq: Once | INTRAVENOUS | Status: AC
  Administered 2017-01-20: 15:00:00 via INTRAVENOUS

## 2017-01-20 MED ORDER — HEPARIN SOD (PORK) LOCK FLUSH 100 UNIT/ML IV SOLN
250.0000 [IU] | Freq: Once | INTRAVENOUS | Status: DC | PRN
  Filled 2017-01-20: qty 5

## 2017-01-20 MED ORDER — SODIUM CHLORIDE 0.9% FLUSH
3.0000 mL | INTRAVENOUS | Status: DC | PRN
  Filled 2017-01-20: qty 10

## 2017-01-20 NOTE — Progress Notes (Signed)
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 I then placed a Hill-Ferguson anoscope into the anal canal and evaluated this completely. The mass was noted with an area of central necrosis invading into the sphincter complex. The edges of the ulcer were trimmed away using scissors. This was sent to pathology for further examination       12/06/2016 Imaging    CT chest, abdomen and pelvis with contrast showed apparent wall thickening in the distal rectum and anus, this is associated with 2 posterior left pelvic sidewall nodes, highly suspicious for metastatic disease. 10 mm right upper lobe pulmonary nodule has essential stippled calcification and associated calcified nodule tissue in the right hilum, probably granuloma. No other distant metastasis.       12/10/2016 Initial Diagnosis    Anal cancer ([place])     12/13/2016 Imaging    NM PET Image Initial (pi) Skull Base to Thigh  IMPRESSION: 1. Intensely hypermetabolic 4.0 x 3.3 x 6.3 cm anal mass, compatible with known primary anal carcinoma. 2. Hypermetabolic bilateral internal iliac nodal metastases. 3. Hypermetabolic left inguinal nodal metastasis. 4. Solitary mildly hypermetabolic nonenlarged left para-aortic lymph node, suspicious for nodal metastasis. 5. No additional definite sites of hypermetabolic  metastatic disease. 6. Nonspecific nonenlarged mildly hypermetabolic bilateral upper neck lymph nodes, statistically most likely to represent benign reactive nodes. 7. Mild paraseptal emphysema     INTERVAL HISTORY:   Juan Harrison returns after missing a follow-up visit last week. He continues radiation. He completed cycle 1 5-FU/mitomycin C beginning 12/16/2016.  He denies nausea/vomiting. No mouth sores. He had some loose stools last week. He has noted recent improvement. He continues to have pain at the perineum/rectum. He is taking MS Contin with oxycodone as needed. Appetite is decreased. He has lost some weight.  Objective:  Vital signs in last 24 hours:  Blood pressure 113/86, pulse 94, temperature 97.8 F (36.6 C), temperature source Oral, resp. rate 16, height 6\' 3"  (1.905 m), weight 137 lb 1.6 oz (62.2 kg), SpO2 100 %.    HEENT: No thrush or ulcers. Resp: Lungs clear bilaterally. Cardio: Regular rate and rhythm. GI: Abdomen soft and nontender. No hepatomegaly. Vascular: No leg edema. Skin: Several areas of skin breakdown extending from the perineum to the gluteal fold.  Right upper extremity PICC site without erythema.  Lab Results:  Lab Results  Component Value Date   WBC 3.7 (L) 01/20/2017   HGB 13.2 01/20/2017   HCT 38.6 01/20/2017   MCV 86.0 01/20/2017   PLT 170 01/20/2017   NEUTROABS 1.9 01/20/2017    Imaging:  No results found.  Medications: I have reviewed the patient's current medications.  Assessment/Plan: 1. Anal cancer, invasive squamous cell carcinoma,cT2N1aM0, stage IIIA; initiation of radiation 12/16/2016; cycle 1 5-FU/mitomycin C beginning 12/16/2016; cycle 2 5-FU/mitomycin C beginning 01/20/2017 2. 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 Currently taking MS Contin with oxycodone as needed. 3. HIV/AIDS. He is followed by Dr. Johnnye Harrison. 4. Anorexia/weight loss. We made a referral to the [place]  dietitian.   Disposition: [name] appears stable. He continues radiation. The plan is to proceed with cycle 2 5-FU/mitomycin-C today as scheduled. The PICC line will be removed 01/24/2017.  His appetite is decreased and he has lost some weight during the course of treatment. We made a referral to the [place] dietitian.  He will return for labs as scheduled 01/27/2017. He will return for a follow-up visit with Juan Harrison next week.    Ned Card ANP/GNP-BC   01/20/2017  1:10 PM

## 2017-01-20 NOTE — Patient Instructions (Signed)
PICC Home Guide °A peripherally inserted central catheter (PICC) is a long, thin, flexible tube that is inserted into a vein in the upper arm. It is a form of intravenous (IV) access. It is considered to be a "central" line because the tip of the PICC ends in a large vein in your chest. This large vein is called the superior vena cava (SVC). The PICC tip ends in the SVC because there is a lot of blood flow in the SVC. This allows medicines and IV fluids to be quickly distributed throughout the body. The PICC is inserted using a sterile technique by a specially trained nurse or physician. After the PICC is inserted, a chest X-ray exam is done to be sure it is in the correct place. °A PICC may be placed for different reasons, such as: °· To give medicines and liquid nutrition that can only be given through a central line. Examples are: °? Certain antibiotic treatments. °? Chemotherapy. °? Total parenteral nutrition (TPN). °· To take frequent blood samples. °· To give IV fluids and blood products. °· If there is difficulty placing a peripheral intravenous (PIV) catheter. ° °If taken care of properly, a PICC can remain in place for several months. A PICC can also allow a person to go home from the hospital early. Medicine and PICC care can be managed at home by a family member or home health care team. °What problems can happen when I have a PICC? °Problems with a PICC can occasionally occur. These may include the following: °· A blood clot (thrombus) forming in or at the tip of the PICC. This can cause the PICC to become clogged. A clot-dissolving medicine called tissue plasminogen activator (tPA) can be given through the PICC to help break up the clot. °· Inflammation of the vein (phlebitis) in which the PICC is placed. Signs of inflammation may include redness, pain at the insertion site, red streaks, or being able to feel a "cord" in the vein where the PICC is located. °· Infection in the PICC or at the insertion  site. Signs of infection may include fever, chills, redness, swelling, or pus drainage from the PICC insertion site. °· PICC movement (malposition). The PICC tip may move from its original position due to excessive physical activity, forceful coughing, sneezing, or vomiting. °· A break or cut in the PICC. It is important to not use scissors near the PICC. °· Nerve or tendon irritation or injury during PICC insertion. ° °What should I keep in mind about activities when I have a PICC? °· You may bend your arm and move it freely. If your PICC is near or at the bend of your elbow, avoid activity with repeated motion at the elbow. °· Rest at home for the remainder of the day following PICC line insertion. °· Avoid lifting heavy objects as instructed by your health care provider. °· Avoid using a crutch with the arm on the same side as your PICC. You may need to use a walker. °What should I know about my PICC dressing? °· Keep your PICC bandage (dressing) clean and dry to prevent infection. °? Ask your health care provider when you may shower. Ask your health care provider to teach you how to wrap the PICC when you do take a shower. °· Change the PICC dressing as instructed by your health care provider. °· Change your PICC dressing if it becomes loose or wet. °What should I know about PICC care? °· Check the PICC insertion   site daily for leakage, redness, swelling, or pain. °· Do not take a bath, swim, or use hot tubs when you have a PICC. Cover PICC line with clear plastic wrap and tape to keep it dry while showering. °· Flush the PICC as directed by your health care provider. Let your health care provider know right away if the PICC is difficult to flush or does not flush. Do not use force to flush the PICC. °· Do not use a syringe that is less than 10 mL to flush the PICC. °· Never pull or tug on the PICC. °· Avoid blood pressure checks on the arm with the PICC. °· Keep your PICC identification card with you at all  times. °· Do not take the PICC out yourself. Only a trained clinical professional should remove the PICC. °Get help right away if: °· Your PICC is accidentally pulled all the way out. If this happens, cover the insertion site with a bandage or gauze dressing. Do not throw the PICC away. Your health care provider will need to inspect it. °· Your PICC was tugged or pulled and has partially come out. Do not  push the PICC back in. °· There is any type of drainage, redness, or swelling where the PICC enters the skin. °· You cannot flush the PICC, it is difficult to flush, or the PICC leaks around the insertion site when it is flushed. °· You hear a "flushing" sound when the PICC is flushed. °· You have pain, discomfort, or numbness in your arm, shoulder, or jaw on the same side as the PICC. °· You feel your heart "racing" or skipping beats. °· You notice a hole or tear in the PICC. °· You develop chills or a fever. °This information is not intended to replace advice given to you by your health care provider. Make sure you discuss any questions you have with your health care provider. °Document Released: 05/25/2003 Document Revised: 06/07/2016 Document Reviewed: 09/10/2013 °Elsevier Interactive Patient Education © 2017 Elsevier Inc. ° °

## 2017-01-20 NOTE — Patient Instructions (Signed)
[facility] Discharge Instructions for Patients Receiving Chemotherapy  Today you received the following chemotherapy agents Mitomycin and 5FU.  To help prevent nausea and vomiting after your treatment, we encourage you to take your nausea medication as directed by your MD.   If you develop nausea and vomiting that is not controlled by your nausea medication, call the clinic.   BELOW ARE SYMPTOMS THAT SHOULD BE REPORTED IMMEDIATELY:  *FEVER GREATER THAN 100.5 F  *CHILLS WITH OR WITHOUT FEVER  NAUSEA AND VOMITING THAT IS NOT CONTROLLED WITH YOUR NAUSEA MEDICATION  *UNUSUAL SHORTNESS OF BREATH  *UNUSUAL BRUISING OR BLEEDING  TENDERNESS IN MOUTH AND THROAT WITH OR WITHOUT PRESENCE OF ULCERS  *URINARY PROBLEMS  *BOWEL PROBLEMS  UNUSUAL RASH Items with * indicate a potential emergency and should be followed up as soon as possible.  Feel free to call the clinic you have any questions or concerns. The clinic phone number is [phone].  Please show the [facility] at check-in to the Emergency Department and triage nurse.  Mitomycin injection What is this medicine? MITOMYCIN (mye toe MYE sin) is a chemotherapy drug. This medicine is used to treat cancer of the stomach and pancreas. This medicine may be used for other purposes; ask your health care provider or pharmacist if you have questions. COMMON BRAND NAME(S): Mutamycin What should I tell my health care provider before I take this medicine? They need to know if you have any of these conditions: -anemia -bleeding disorder -infection (especially a virus infection such as chickenpox, cold sores, or herpes) -kidney disease -low blood counts like low platelets, red blood cells, white blood cells -recent radiation therapy -an unusual or allergic reaction to mitomycin, other chemotherapy agents, other medicines, foods, dyes, or preservatives -pregnant or trying to get pregnant -breast-feeding How should I  use this medicine? This drug is given as an injection or infusion into a vein. It is administered in a hospital or clinic by a specially trained health care professional. Talk to your pediatrician regarding the use of this medicine in children. Special care may be needed. Overdosage: If you think you have taken too much of this medicine contact a poison control center or emergency room at once. NOTE: This medicine is only for you. Do not share this medicine with others. What if I miss a dose? It is important not to miss your dose. Call your doctor or health care professional if you are unable to keep an appointment. What may interact with this medicine? -medicines to increase blood counts like filgrastim, pegfilgrastim, sargramostim -vaccines This list may not describe all possible interactions. Give your health care provider a list of all the medicines, herbs, non-prescription drugs, or dietary supplements you use. Also tell them if you smoke, drink alcohol, or use illegal drugs. Some items may interact with your medicine. What should I watch for while using this medicine? Your condition will be monitored carefully while you are receiving this medicine. You will need important blood work done while you are taking this medicine. This drug may make you feel generally unwell. This is not uncommon, as chemotherapy can affect healthy cells as well as cancer cells. Report any side effects. Continue your course of treatment even though you feel ill unless your doctor tells you to stop. Call your doctor or health care professional for advice if you get a fever, chills or sore throat, or other symptoms of a cold or flu. Do not treat yourself. This drug decreases your  body's ability to fight infections. Try to avoid being around people who are sick. This medicine may increase your risk to bruise or bleed. Call your doctor or health care professional if you notice any unusual bleeding. Be careful brushing and  flossing your teeth or using a toothpick because you may get an infection or bleed more easily. If you have any dental work done, tell your dentist you are receiving this medicine. Avoid taking products that contain aspirin, acetaminophen, ibuprofen, naproxen, or ketoprofen unless instructed by your doctor. These medicines may hide a fever. Do not become pregnant while taking this medicine. Women should inform their doctor if they wish to become pregnant or think they might be pregnant. There is a potential for serious side effects to an unborn child. Talk to your health care professional or pharmacist for more information. Do not breast-feed an infant while taking this medicine. What side effects may I notice from receiving this medicine? Side effects that you should report to your doctor or health care professional as soon as possible: -allergic reactions like skin rash, itching or hives, swelling of the face, lips, or tongue -low blood counts - this medicine may decrease the number of white blood cells, red blood cells and platelets. You may be at increased risk for infections and bleeding. -signs of infection - fever or chills, cough, sore throat, pain or difficulty passing urine -signs of decreased platelets or bleeding - bruising, pinpoint red spots on the skin, black, tarry stools, blood in the urine -signs of decreased red blood cells - unusually weak or tired, fainting spells, lightheadedness -breathing problems -changes in vision -chest pain -confusion -dry cough -high blood pressure -mouth sores -pain, swelling, redness at site where injected -pain, tingling, numbness in the hands or feet -seizures -swelling of the ankles, feet, hands -trouble passing urine or change in the amount of urine Side effects that usually do not require medical attention (report to your doctor or health care professional if they continue or are bothersome): -diarrhea -green to blue color of urine -hair  loss -loss of appetite -nausea, vomiting This list may not describe all possible side effects. Call your doctor for medical advice about side effects. You may report side effects to FDA at 1-800-FDA-1088. Where should I keep my medicine? This drug is given in a hospital or clinic and will not be stored at home. NOTE: This sheet is a summary. It may not cover all possible information. If you have questions about this medicine, talk to your doctor, pharmacist, or health care provider.  2017 Elsevier/Gold Standard (2008-05-26 11:16:23)  Fluorouracil, 5-FU injection What is this medicine? FLUOROURACIL, 5-FU (flure oh YOOR a sil) is a chemotherapy drug. It slows the growth of cancer cells. This medicine is used to treat many types of cancer like breast cancer, colon or rectal cancer, pancreatic cancer, and stomach cancer. This medicine may be used for other purposes; ask your health care provider or pharmacist if you have questions. COMMON BRAND NAME(S): Adrucil What should I tell my health care provider before I take this medicine? They need to know if you have any of these conditions: -blood disorders -dihydropyrimidine dehydrogenase (DPD) deficiency -infection (especially a virus infection such as chickenpox, cold sores, or herpes) -kidney disease -liver disease -malnourished, poor nutrition -recent or ongoing radiation therapy -an unusual or allergic reaction to fluorouracil, other chemotherapy, other medicines, foods, dyes, or preservatives -pregnant or trying to get pregnant -breast-feeding How should I use this medicine? This drug is given  as an infusion or injection into a vein. It is administered in a hospital or clinic by a specially trained health care professional. Talk to your pediatrician regarding the use of this medicine in children. Special care may be needed. Overdosage: If you think you have taken too much of this medicine contact a poison control center or emergency room  at once. NOTE: This medicine is only for you. Do not share this medicine with others. What if I miss a dose? It is important not to miss your dose. Call your doctor or health care professional if you are unable to keep an appointment. What may interact with this medicine? -allopurinol -cimetidine -dapsone -digoxin -hydroxyurea -leucovorin -levamisole -medicines for seizures like ethotoin, fosphenytoin, phenytoin -medicines to increase blood counts like filgrastim, pegfilgrastim, sargramostim -medicines that treat or prevent blood clots like warfarin, enoxaparin, and dalteparin -methotrexate -metronidazole -pyrimethamine -some other chemotherapy drugs like busulfan, cisplatin, estramustine, vinblastine -trimethoprim -trimetrexate -vaccines Talk to your doctor or health care professional before taking any of these medicines: -acetaminophen -aspirin -ibuprofen -ketoprofen -naproxen This list may not describe all possible interactions. Give your health care provider a list of all the medicines, herbs, non-prescription drugs, or dietary supplements you use. Also tell them if you smoke, drink alcohol, or use illegal drugs. Some items may interact with your medicine. What should I watch for while using this medicine? Visit your doctor for checks on your progress. This drug may make you feel generally unwell. This is not uncommon, as chemotherapy can affect healthy cells as well as cancer cells. Report any side effects. Continue your course of treatment even though you feel ill unless your doctor tells you to stop. In some cases, you may be given additional medicines to help with side effects. Follow all directions for their use. Call your doctor or health care professional for advice if you get a fever, chills or sore throat, or other symptoms of a cold or flu. Do not treat yourself. This drug decreases your body's ability to fight infections. Try to avoid being around people who are  sick. This medicine may increase your risk to bruise or bleed. Call your doctor or health care professional if you notice any unusual bleeding. Be careful brushing and flossing your teeth or using a toothpick because you may get an infection or bleed more easily. If you have any dental work done, tell your dentist you are receiving this medicine. Avoid taking products that contain aspirin, acetaminophen, ibuprofen, naproxen, or ketoprofen unless instructed by your doctor. These medicines may hide a fever. Do not become pregnant while taking this medicine. Women should inform their doctor if they wish to become pregnant or think they might be pregnant. There is a potential for serious side effects to an unborn child. Talk to your health care professional or pharmacist for more information. Do not breast-feed an infant while taking this medicine. Men should inform their doctor if they wish to father a child. This medicine may lower sperm counts. Do not treat diarrhea with over the counter products. Contact your doctor if you have diarrhea that lasts more than 2 days or if it is severe and watery. This medicine can make you more sensitive to the sun. Keep out of the sun. If you cannot avoid being in the sun, wear protective clothing and use sunscreen. Do not use sun lamps or tanning beds/booths. What side effects may I notice from receiving this medicine? Side effects that you should report to your doctor or health  care professional as soon as possible: -allergic reactions like skin rash, itching or hives, swelling of the face, lips, or tongue -low blood counts - this medicine may decrease the number of white blood cells, red blood cells and platelets. You may be at increased risk for infections and bleeding. -signs of infection - fever or chills, cough, sore throat, pain or difficulty passing urine -signs of decreased platelets or bleeding - bruising, pinpoint red spots on the skin, black, tarry stools,  blood in the urine -signs of decreased red blood cells - unusually weak or tired, fainting spells, lightheadedness -breathing problems -changes in vision -chest pain -mouth sores -nausea and vomiting -pain, swelling, redness at site where injected -pain, tingling, numbness in the hands or feet -redness, swelling, or sores on hands or feet -stomach pain -unusual bleeding Side effects that usually do not require medical attention (report to your doctor or health care professional if they continue or are bothersome): -changes in finger or toe nails -diarrhea -dry or itchy skin -hair loss -headache -loss of appetite -sensitivity of eyes to the light -stomach upset -unusually teary eyes This list may not describe all possible side effects. Call your doctor for medical advice about side effects. You may report side effects to FDA at 1-800-FDA-1088. Where should I keep my medicine? This drug is given in a hospital or clinic and will not be stored at home. NOTE: This sheet is a summary. It may not cover all possible information. If you have questions about this medicine, talk to your doctor, pharmacist, or health care provider.  2017 Elsevier/Gold Standard (2008-03-23 13:53:16)

## 2017-01-21 ENCOUNTER — Ambulatory Visit
Admission: RE | Admit: 2017-01-21 | Discharge: 2017-01-21 | Disposition: A | Discharge status: Home / Self Care | Payer: Medicare Other | Source: Ambulatory Visit | Attending: Radiation Oncology | Admitting: Radiation Oncology

## 2017-01-21 ENCOUNTER — Ambulatory Visit: Payer: Medicare Other

## 2017-01-21 ENCOUNTER — Ambulatory Visit: Payer: Medicare Other | Admitting: Hematology

## 2017-01-21 DIAGNOSIS — Z510 Encounter for antineoplastic radiation therapy: Secondary | ICD-10-CM | POA: Diagnosis not present

## 2017-01-21 DIAGNOSIS — F17210 Nicotine dependence, cigarettes, uncomplicated: Secondary | ICD-10-CM | POA: Diagnosis not present

## 2017-01-21 DIAGNOSIS — B20 Human immunodeficiency virus [HIV] disease: Secondary | ICD-10-CM | POA: Diagnosis not present

## 2017-01-21 DIAGNOSIS — C210 Malignant neoplasm of anus, unspecified: Secondary | ICD-10-CM | POA: Diagnosis not present

## 2017-01-21 NOTE — Progress Notes (Signed)
1025-Patient to infusion room, stating that 5FU pump has stopped. Patient states that pump beeped around midnight last night, but he was unaware of the exact time that pump stopped. On assessment of pump and port, port site and dressing intact, positive blood return noted, pump stopped with 20 ml given of 250 ml.  Pump infusing at 2.6 ml/hr and was started on 01/20/17 at 1609.  Pump restarted at 2.6 ml/hr and patient sent to radiation appointment and instructed to come back to the infusion room after treatment for further  assessment.  Dr. Burr Medico notified of stopped pump and order received to increase rate of pump so that 5FU will complete on Friday, 01/24/17 between 2-3PM.  Patient back to infusion room and pump rate increased to 3.0 ml/hr.  Rate double checked by Arbie Cookey in pharmacy.  Patient instructed to come back to [facility] on Friday, 01/24/17 at 2:00PM for pump and picc d/c.  Patient verbalized an understanding of all instructions.

## 2017-01-22 ENCOUNTER — Ambulatory Visit: Payer: Medicare Other

## 2017-01-22 ENCOUNTER — Telehealth: Payer: Self-pay | Admitting: Hematology

## 2017-01-22 ENCOUNTER — Ambulatory Visit
Admission: RE | Admit: 2017-01-22 | Discharge: 2017-01-22 | Disposition: A | Discharge status: Home / Self Care | Payer: Medicare Other | Source: Ambulatory Visit | Attending: Radiation Oncology | Admitting: Radiation Oncology

## 2017-01-22 DIAGNOSIS — F17210 Nicotine dependence, cigarettes, uncomplicated: Secondary | ICD-10-CM | POA: Diagnosis not present

## 2017-01-22 DIAGNOSIS — Z510 Encounter for antineoplastic radiation therapy: Secondary | ICD-10-CM | POA: Diagnosis not present

## 2017-01-22 DIAGNOSIS — B20 Human immunodeficiency virus [HIV] disease: Secondary | ICD-10-CM | POA: Diagnosis not present

## 2017-01-22 DIAGNOSIS — C210 Malignant neoplasm of anus, unspecified: Secondary | ICD-10-CM | POA: Diagnosis not present

## 2017-01-22 NOTE — Telephone Encounter (Signed)
Spoke the with patient and confirm all appointments

## 2017-01-23 ENCOUNTER — Ambulatory Visit: Payer: Medicare Other

## 2017-01-23 ENCOUNTER — Ambulatory Visit
Admission: RE | Admit: 2017-01-23 | Discharge: 2017-01-23 | Disposition: A | Discharge status: Home / Self Care | Payer: Medicare Other | Source: Ambulatory Visit | Attending: Radiation Oncology | Admitting: Radiation Oncology

## 2017-01-23 ENCOUNTER — Ambulatory Visit: Payer: Medicare Other | Admitting: Nutrition

## 2017-01-23 DIAGNOSIS — Z510 Encounter for antineoplastic radiation therapy: Secondary | ICD-10-CM | POA: Diagnosis not present

## 2017-01-23 DIAGNOSIS — F17210 Nicotine dependence, cigarettes, uncomplicated: Secondary | ICD-10-CM | POA: Diagnosis not present

## 2017-01-23 DIAGNOSIS — C210 Malignant neoplasm of anus, unspecified: Secondary | ICD-10-CM | POA: Diagnosis not present

## 2017-01-23 DIAGNOSIS — B20 Human immunodeficiency virus [HIV] disease: Secondary | ICD-10-CM | POA: Diagnosis not present

## 2017-01-23 NOTE — Progress Notes (Signed)
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  He is a patient of Dr. Lisbeth Renshaw and Dr. Burr Medico.  Past medical history includes hepatitis B and AIDS.  Medications include Zofran, MiraLAX, and Compazine.  Labs include potassium 3.4, and albumin 3.5 on February 19.  Height: 6 feet, 3 inches. Weight: 137.1 pounds February 19. Usual body weight: 151 pounds January 2018 BMI: 17.1. (Underweight)  Patient is receiving chemotherapy and radiation treatments. He denies nausea and vomiting. He reports decreased appetite and endorses weight loss. He describes multiple episodes of loose watery stool.  Nutrition diagnosis:  Severe malnutrition in the context of acute illness secondary to greater than 5% weight loss over one month and less than 50% energy intake for greater than 5 days as well as moderate depletion of body fat and muscle mass.  Intervention: Patient was educated to consume smaller more frequent meals throughout the day. Reviewed high protein foods with patient. Recommended patient consume 2-3 oral nutrition supplements daily and provided samples. Provided one complementary case of Ensure Plus. Reviewed dietary guidelines for eating to improve diarrhea. Fact sheets were given and contact information provided.  Questions were answered.  Monitoring, evaluation, goals:  Patient will tolerate increased calories and protein to minimize further weight loss.  Next visit: Patient will contact me for any questions concerns or needs.  **Disclaimer: This note was dictated with voice recognition software. Similar sounding words can inadvertently be transcribed and this note may contain transcription errors which may not have been corrected upon publication of note.**

## 2017-01-24 ENCOUNTER — Ambulatory Visit (HOSPITAL_BASED_OUTPATIENT_CLINIC_OR_DEPARTMENT_OTHER): Payer: Medicare Other

## 2017-01-24 ENCOUNTER — Ambulatory Visit
Admission: RE | Admit: 2017-01-24 | Discharge: 2017-01-24 | Disposition: A | Discharge status: Home / Self Care | Payer: Medicare Other | Source: Ambulatory Visit | Attending: Radiation Oncology | Admitting: Radiation Oncology

## 2017-01-24 ENCOUNTER — Ambulatory Visit: Payer: Medicare Other

## 2017-01-24 VITALS — BP 112/64 | HR 72 | Temp 98.2°F | Resp 16

## 2017-01-24 DIAGNOSIS — F17210 Nicotine dependence, cigarettes, uncomplicated: Secondary | ICD-10-CM | POA: Diagnosis not present

## 2017-01-24 DIAGNOSIS — Z452 Encounter for adjustment and management of vascular access device: Secondary | ICD-10-CM | POA: Diagnosis not present

## 2017-01-24 DIAGNOSIS — C210 Malignant neoplasm of anus, unspecified: Secondary | ICD-10-CM | POA: Diagnosis not present

## 2017-01-24 DIAGNOSIS — Z510 Encounter for antineoplastic radiation therapy: Secondary | ICD-10-CM | POA: Diagnosis not present

## 2017-01-24 DIAGNOSIS — B20 Human immunodeficiency virus [HIV] disease: Secondary | ICD-10-CM | POA: Diagnosis not present

## 2017-01-24 MED ORDER — HEPARIN SOD (PORK) LOCK FLUSH 100 UNIT/ML IV SOLN
250.0000 [IU] | Freq: Once | INTRAVENOUS | Status: DC | PRN
  Filled 2017-01-24: qty 5

## 2017-01-24 MED ORDER — OXYCODONE HCL 5 MG PO TABS: 5.0000 mg | ORAL_TABLET | ORAL | 0 refills | Status: DC | PRN

## 2017-01-24 MED ORDER — OXYCODONE-ACETAMINOPHEN 5-325 MG PO TABS: 1.0000 | ORAL_TABLET | Freq: Once | ORAL | 0 refills | Status: AC

## 2017-01-24 MED ORDER — SODIUM CHLORIDE 0.9% FLUSH
10.0000 mL | INTRAVENOUS | Status: DC | PRN
  Administered 2017-01-24: 10 mL
  Filled 2017-01-24: qty 10

## 2017-01-24 MED ORDER — SILVER SULFADIAZINE 1 % EX CREA
TOPICAL_CREAM | Freq: Every day | CUTANEOUS | Status: DC
  Administered 2017-01-24: 15:00:00 via TOPICAL

## 2017-01-24 MED ORDER — OXYCODONE-ACETAMINOPHEN 5-325 MG PO TABS
1.0000 | ORAL_TABLET | Freq: Once | ORAL | Status: AC
  Administered 2017-01-24: 1 via ORAL
  Filled 2017-01-24: qty 1

## 2017-01-24 NOTE — Progress Notes (Signed)
1620: Right PICC removed per MD order, Vaseline Guaze pressure dressing applied. Pt educated on PICC removal discharge instructions, do not lift anything for 24 hours, leave dressing intact for 24 hours, monitor for s/s of bleeding, etc. Pt verbalizes understanding. Pt to lay flat and be monitored for 30 minutes.

## 2017-01-24 NOTE — Patient Instructions (Signed)
PICC Removal, Care After Refer to this sheet in the next few weeks. These instructions provide you with information on caring for yourself after your procedure. Your health care provider may also give you more specific instructions. Your treatment has been planned according to current medical practices, but problems sometimes occur. Call your health care provider if you have any problems or questions after your procedure. What can I expect after the procedure? After your procedure, it is typical to have mild discomfort at the insertion site. This should not last for more than a day. Follow these instructions at home: You may remove the bandage after 24 hours. The PICC insertion site is very small. A small scab may develop over the insertion site. It is okay to wash the site gently with soap and water. Be careful not to remove or pick off the scab. Gently pat the site dry after washing it. You do not need to put another bandage over the insertion site. Do not lift anything heavy or do strenuous physical activity for 24 hours after the PICC is removed. This includes:  Weight lifting.  Strenuous yard work.  Any physical activity with repetitive arm movement.  Contact a health care provider if:  You have swelling or puffiness in your arm at the PICC insertion site.  You have increasing tenderness at the PICC insertion site. Get help right away if:  You have numbness or tingling in your fingers, hand, or arm.  Your arm looks blue and feels cold.  You have redness around the insertion site or a red streak goes up your arm.  You have any type of drainage from the PICC insertion site. This includes drainage such as: ? Bleeding from the insertion site. If this happens, apply firm, direct pressure to the PICC insertion site with a clean towel. ? Drainage that is yellow or tan.  You have a fever. This information is not intended to replace advice given to you by your health care provider. Make  sure you discuss any questions you have with your health care provider. Document Released: 11/23/2013 Document Revised: 04/25/2016 Document Reviewed: 09/10/2013 Elsevier Interactive Patient Education  2017 Elsevier Inc.  

## 2017-01-24 NOTE — Progress Notes (Signed)
Patient seen I 3:21 PM n back by MD, took jar 50gm silvadene   And rx pain mediation and follow up appt  Given to [facility] RT therapist to give the patient who is getting his rad tx at this time, patient doesn't need to come to nursing per MD

## 2017-01-24 NOTE — Progress Notes (Signed)
Per patient in pain, lying on table in dressing room, okay to give 1 percocet  5/235mg  oral x 1 now per Dr. Lisbeth Renshaw, patioent hasn't taken any pain meds since ethis am, patient grimacing, gave 1 percocet 5/325mg  tablet x 1 oral with water 3:59 PM

## 2017-01-24 NOTE — Progress Notes (Signed)
[place]  [phone] [phone]  Clinic follow up Note   Patient Care Team: No Pcp Per Patient as PCP - General (General Practice) Campbell Riches, MD as Consulting Physician (Infectious Diseases) Leighton Ruff, MD as Consulting Physician (General Surgery) Kyung Rudd, MD as Consulting Physician (Radiation Oncology) 01/27/2017   CHIEF COMPLAINTS:  Follow up anal cancer  Oncology History   Anal cancer ([place])   Staging form: Anus, AJCC 8th Edition   - Clinical: Stage IIIA (cT2, cN1a, cM0) - Signed by Truitt Merle, MD on 12/10/2016      Anal cancer ([place])   11/29/2016 Initial Biopsy    Anal canal mass biopsy showed 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 Marcello Moores in OR:"There was an obvious anal mass noted in the left posterior lateral anal canal.  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   I then placed a Hill-Ferguson anoscope into the anal canal and evaluated this completely.  The mass was noted with an area of central necrosis invading into the sphincter complex.  The edges of the ulcer were trimmed away using scissors.  This was sent to pathology for further examination       12/06/2016 Imaging    CT chest, abdomen and pelvis with contrast showed apparent wall thickening in the distal rectum and anus, this is associated with 2 posterior left pelvic sidewall nodes, highly suspicious for metastatic disease. 10 mm right upper lobe pulmonary nodule has essential stippled calcification and associated calcified nodule tissue in the right hilum, probably granuloma. No other distant metastasis.       12/10/2016 Initial Diagnosis    Anal cancer ([place])     12/13/2016 Imaging    NM PET Image Initial (pi) Skull Base to Thigh  IMPRESSION: 1. Intensely hypermetabolic 4.0 x 3.3 x 6.3 cm anal  mass, compatible with known primary anal carcinoma. 2. Hypermetabolic bilateral internal iliac nodal metastases. 3. Hypermetabolic left inguinal nodal metastasis. 4. Solitary mildly hypermetabolic nonenlarged left para-aortic lymph node, suspicious for nodal metastasis. 5. No additional definite sites of hypermetabolic metastatic disease. 6. Nonspecific nonenlarged mildly hypermetabolic bilateral upper neck lymph nodes, statistically most likely to represent benign reactive nodes. 7. Mild paraseptal emphysema       12/16/2016 - 01/27/2017 Radiation Therapy    Definitive radiation to his anal cancer, 55 Gy in 30 fractions       12/16/2016 - 01/24/2017 Chemotherapy    Concurrent chemo Mitomycin 10mg /m2 on day 1 and 35 and 5-fu 1000mg /m2/day on day 1-5 and 35-39 (second cycle postponed due to scheduling issue), pt tolerated well          HISTORY OF PRESENTING ILLNESS (12/11/2015):  Juan Harrison 46 y.o. male with past medical history of HIV, is here because of His recent diagnosed anal cancer. He is accompanied by his sister to the clinic today.  He has had bloody BM and rectal pain for one month, his rectal bleeding is mild, mixed with stool, no blood clots. He rates his rectal pain at 7-8/10, especially with bowel movement. he takes oxycodone 1-2/day.   Patient was seen by colorectal surgeon Dr. Marcello Moores, could not tolerate rectal exam and anoscope in office, was brought to the OR for the above exam, which showed " There was an obvious anal mass noted in the left posterior lateral anal canal.  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   I then placed a Hill-Ferguson anoscope into the anal canal and evaluated this completely.  The mass was noted with an area of central necrosis invading into the sphincter complex.  The edges of the ulcer were trimmed away using scissors.  This was sent to pathology for further examination". The biopsy showed squamous cell  carcinoma.  He was found to be HIV positive in 2007, has been seen Per ID Dr. [name], not very compliant. He previously on  DRVr/TRV/EPZ bactrim, changed to genvoya 05-2016.  His last CD4 was 110 on 10/29/2016.   He is single, lives alone in a two story townhouse. He does not work, on [employer]. He has a sister [name] who lives in [place]. He has some friends, only able to help if he needs.  CURRENT THERAPY: Concurrent radiation with chemotherapy mitomycin on day 1 and 28, 5-FU on day 1-5, and day 28-32  INTERIM HISTORY: Mr. Weinschenk returns for follow up. He had last radiation this morning. He still has significant rectal pain, for which he takes MS Contin and oxycodone as needed, overall manageable. He has a loose bowel movement 3-5 times a day, does not take Imodium. No significant nausea or other complaints. No fever or bleeding. He is able to tolerating routine activities, does not sleep well at night due to the pain.  MEDICAL HISTORY:  Past Medical History:  Diagnosis Date  . AIDS [facility]) infectious disease--- dr hatcher   dx 2007,  usCD4,  count at 110  . Anal pain   . Cancer ([place]) 11/29/2016   anal invasive mod diff squamous cell ca w/squamous cell ca in situ  . Condyloma   . Hepatitis B surface antigen positive     SURGICAL HISTORY: Past Surgical History:  Procedure Laterality Date  . IR GENERIC HISTORICAL  12/16/2016   IR US GUIDE VASC ACCESS RIGHT 12/16/2016 Arne Cleveland, MD [facility]-INTERV RAD  . IR GENERIC HISTORICAL  12/16/2016   IR FLUORO GUIDE CV LINE RIGHT 12/16/2016 Arne Cleveland, MD [facility]INTER[facility]  . RECTAL BIOPSY N/A 11/29/2016   Procedure: BIOPSY ANAL CANAL MASS;  Surgeon: Leighton Ruff, MD;  [facility];  Service: General;  Laterality: N/A;  . RECTAL EXAM UNDER ANESTHESIA N/A 11/29/2016   Procedure: EXAM UNDER ANESTHESIA;  Surgeon: Leighton Ruff, MD;  Location: [place];  Service: General;  Laterality: N/A;  . 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 Marital status: Single    Spouse name: N/A  . Number of children: N/A  . Years of education: N/A   Occupational History  . Not on file.   Social History Main Topics  . Smoking status: Current Every Day Smoker    Packs/day: 0.25    Years: 20.00    Types: Cigarettes    Last attempt to quit: 10/13/2016  . Smokeless tobacco: Never Used     Comment: 2-3 cigarettes a day   . Alcohol use No     Comment: He used to drink alcohol on weekends for 25 years, he quit on 10/14/2016   . Drug use: Yes    Types: Marijuana  . Sexual activity: Not on file   Other Topics Concern  . Not on file   Social History Narrative   Single, lives alone about 10 minutes from [place] in  2 story townhouse   Has one sister, [name] who lives in [place] in past via friends   On disability    FAMILY HISTORY: Family History  Problem Relation Age of Onset  . COPD Mother   . Asthma Brother     ALLERGIES:  has No Known Allergies.  MEDICATIONS:  Current Outpatient Prescriptions  Medication Sig Dispense Refill  . acetaminophen (TYLENOL) 500 MG tablet Take 500 mg by mouth every 6 (six) hours as needed.    . elvitegravir-cobicistat-emtricitabine-tenofovir (GENVOYA) 150-150-200-10 MG TABS tablet Take 1 tablet by mouth daily with breakfast. 90 tablet 3  . ibuprofen (ADVIL,MOTRIN) 200 MG tablet Take 200 mg by mouth as needed.    Marland Kitchen morphine (MS CONTIN) 15 MG 12 hr tablet Take 1 tablet (15 mg total) by mouth every 12 (twelve) hours. 30 tablet 0  . ondansetron (ZOFRAN) 8 MG tablet Take 1 tablet (8 mg total) by mouth 2 (two) times daily as needed (Nausea or vomiting). 30 tablet 1  . oxyCODONE (OXY IR/ROXICODONE) 5 MG immediate release tablet Take 1 tablet (5 mg total) by mouth every 4 (four) hours as needed. 60 tablet 0  . prochlorperazine (COMPAZINE) 10 MG tablet Take 1 tablet (10 mg total) by mouth  every 6 (six) hours as needed (Nausea or vomiting). 30 tablet 1  . silver sulfADIAZINE (SILVADENE) 1 % cream Apply 1 application topically 2 (two) times daily. Apply a thin layer twice daily to affected area.    . valACYclovir (VALTREX) 1000 MG tablet Take 1 tablet (1,000 mg total) by mouth 2 (two) times daily. (Patient taking differently: Take 1,000 mg by mouth daily. ) 20 tablet 0  . polyethylene glycol powder (MIRALAX) powder Take 17 g by mouth daily. (Patient not taking: Reported on 01/27/2017) 255 g 1   No current facility-administered medications for this visit.     REVIEW OF SYSTEMS:   Constitutional: Denies fevers, chills or abnormal night sweats Eyes: Denies blurriness of vision, double vision or watery eyes Ears, nose, mouth, throat, and face: Denies mucositis or sore throat Respiratory: Denies cough, dyspnea or wheezes Cardiovascular: Denies palpitation, chest discomfort or lower extremity swelling Gastrointestinal:  Denies nausea, heartburn or change in bowel habits Skin: Denies abnormal skin rashes (+) tightness and degradation at radiation site.  Lymphatics: Denies new lymphadenopathy or easy bruising Neurological:Denies numbness, tingling or new weaknesses Behavioral/Psych: Mood is stable, no new changes  All other systems were reviewed with the patient and are negative.  PHYSICAL EXAMINATION: ECOG PERFORMANCE STATUS: 1 - Symptomatic but completely ambulatory  Vitals:   01/27/17 1233  BP: 112/82  Pulse: (!) 102  Resp: 18  Temp: 98.2 F (36.8 C)   Filed Weights   01/27/17 1233  Weight: 135 lb 14.4 oz (61.6 kg)   GENERAL:alert, no distress and comfortable SKIN: skin color, texture, turgor are normal, no rashes or significant lesions EYES: normal, conjunctiva are pink and non-injected, sclera clear OROPHARYNX:no exudate, no erythema and lips, buccal mucosa, and tongue normal  NECK: supple, thyroid normal size, non-tender, without nodularity LYMPH:  no palpable  lymphadenopathy in the cervical, axillary or inguinal LUNGS: clear to auscultation and percussion with normal breathing effort HEART: regular rate & rhythm and no murmurs and no lower extremity edema ABDOMEN:abdomen soft, non-tender and normal bowel sounds, rectal exam was deferred  Musculoskeletal:no cyanosis of digits and no clubbing  Anus: (+) multiple small skin ulcers in the skin folder between The buttocks, and at the right groin and testicular  area.  PSYCH: alert & oriented x 3 with fluent speech NEURO: no focal motor/sensory deficits  LABORATORY DATA:  I have reviewed the data as listed CBC Latest Ref Rng & Units 01/27/2017 01/20/2017 01/07/2017  WBC 4.0 - 10.3 10e3/uL 2.0(L) 3.7(L) 5.0  Hemoglobin 13.0 - 17.1 g/dL 13.0 13.2 11.9(L)  Hematocrit 38.4 - 49.9 % 38.9 38.6 35.5(L)  Platelets 140 - 400 10e3/uL 126(L) 170 170   CMP Latest Ref Rng & Units 01/27/2017 01/20/2017 01/07/2017  Glucose 70 - 140 mg/dl 117 137 111  BUN 7.0 - 26.0 mg/dL 10.6 14.0 9.2  Creatinine 0.7 - 1.3 mg/dL 1.0 1.1 1.1  Sodium 136 - 145 mEq/L 138 136 139  Potassium 3.5 - 5.1 mEq/L 4.2 3.4(L) 3.7  Chloride 98 - 110 mmol/L - - -  CO2 22 - 29 mEq/L 26 27 28   Calcium 8.4 - 10.4 mg/dL 9.7 10.1 9.1  Total Protein 6.4 - 8.3 g/dL 8.2 8.2 7.1  Total Bilirubin 0.20 - 1.20 mg/dL 0.69 0.46 <0.22  Alkaline Phos 40 - 150 U/L 80 79 73  AST 5 - 34 U/L 24 21 14   ALT 0 - 55 U/L 35 31 25   PATHOLOGY REPORT  Diagnosis 11/29/2016 Anus, biopsy, anal canal mass - INVASIVE MODERATELY DIFFERENTIATED SQUAMOUS CELL CARCINOMA ASSOCIATED WITH SQUAMOUS CELL CARCINOMA IN SITU.  RADIOGRAPHIC STUDIES: I have personally reviewed the radiological images as listed and agreed with the findings in the report.  PET 12/13/2016 IMPRESSION: 1. Intensely hypermetabolic 4.0 x 3.3 x 6.3 cm anal mass, compatible with known primary anal carcinoma. 2. Hypermetabolic bilateral internal iliac nodal metastases. 3. Hypermetabolic left inguinal nodal  metastasis. 4. Solitary mildly hypermetabolic nonenlarged left para-aortic lymph node, suspicious for nodal metastasis. 5. No additional definite sites of hypermetabolic metastatic disease. 6. Nonspecific nonenlarged mildly hypermetabolic bilateral upper neck lymph nodes, statistically most likely to represent benign reactive nodes. 7. Mild paraseptal emphysema .  ASSESSMENT & PLAN:  46 y.o. African-American male, with history of HIV and AIDS, on genvoya since 03/2016,presents with rectal bleeding and pain.  1.  Anal cancer, invasive squamous cell carcinoma,cT2N1aM0, stage IIIA -I previously reviewed his CT scan findings and anal mass biopsy results in details with patient and his sister. -His previous CT scan showed significantly intact left internal iliac lymph node, highly suspicious for metastasis. No other distant metastasis on CT scan -He has locally advanced anal cancer, I discussed the standard therapy with concurrent chemotherapy and radiation. We usually preserve surgery for residual cancer after chemotherapy and radiation, or recurrent disease. -We previously discussed that his anal cancer will likely be cured by concurrent chemotherapy and radiation, although his HIV and low CD4 count may predicts high risk for chemoradiation, medications, and poor prognosis for his anal cancer. -he now just completed the standard radiation and chemotherapy regiment with mitomycin and 5-FU infusion, received full dose for 2 cycles, second cycle was postponed for one week due to scheduling issues. -The goal of therapy is curative -He overall tolerated chemotherapy well, did not have severe cytopenias. -Lab reviewed, mild neutropenia and thrombocytopenia, we'll continue monitoring. -We discussed the surveillance plan. We'll likely repeat a PET scan in 3 months to evaluate his response to chemotherapy and irradiation. If no residual disease, we'll see him every 3-6 months with surveillance scans every  6-12 months for up to 5 years   2. Rectal bleeding and pain  -Secondary to his anal cancer and radiation dermatitis. His bleeding has stopped, still has moderate pain. -He takes  oxycodone as needed for his rectal pain, but his pain has been worse. -Taking MS Contin 15mg  q12h   3. HIV/AIDS -His treatment was switched to genvoya in 05/2016, his CD4 has improved since then, but overall still low -continue HIV therapy when on chemo, will check with Dr. [name]    4. Social support  -His sister is involved with his care -Social worker will meet the today to help him to arrange this transportation etc.  Plan -he has completed chemo and radiation -I will see him back in 2 weeks for follow up toxicities   All questions were answered. The patient knows to call the clinic with any problems, questions or concerns.  I spent 15 minutes counseling the patient face to face. The total time spent in the appointment was 20 minutes and more than 50% was on counseling.  This document serves as a record of services personally performed by Truitt Merle, MD. It was created on her behalf by Martinique Casey, a trained medical scribe. The creation of this record is based on the scribe's personal observations and the provider's statements to them. This document has been checked and approved by the attending provider.  I have reviewed the above documentation for accuracy and completeness and I agree with the above.   Truitt Merle, MD 01/27/2017

## 2017-01-26 NOTE — Progress Notes (Signed)
Department of Radiation Oncology  Phone:  [phone] Fax:        [phone]  Weekly Treatment Note    Name: Juan Harrison Date: 01/26/2017 MRN: [id number] DOB: 1971-07-08   Diagnosis:     ICD-9-CM ICD-10-CM   1. Anal cancer ([facility]) 154.3 C21.0 oxyCODONE-acetaminophen (PERCOCET/ROXICET) 5-325 MG per tablet 1 tablet     DISCONTINUED: silver sulfADIAZINE (SILVADENE) 1 % cream     Current dose: 52.2 Gy  Current fraction: 29   MEDICATIONS: Current Outpatient Prescriptions  Medication Sig Dispense Refill  . acetaminophen (TYLENOL) 500 MG tablet Take 500 mg by mouth every 6 (six) hours as needed.    . elvitegravir-cobicistat-emtricitabine-tenofovir (GENVOYA) 150-150-200-10 MG TABS tablet Take 1 tablet by mouth daily with breakfast. 90 tablet 3  . ibuprofen (ADVIL,MOTRIN) 200 MG tablet Take 200 mg by mouth as needed.    Marland Kitchen morphine (MS CONTIN) 15 MG 12 hr tablet Take 1 tablet (15 mg total) by mouth every 12 (twelve) hours. 30 tablet 0  . ondansetron (ZOFRAN) 8 MG tablet Take 1 tablet (8 mg total) by mouth 2 (two) times daily as needed (Nausea or vomiting). (Patient not taking: Reported on 01/17/2017) 30 tablet 1  . oxyCODONE (OXY IR/ROXICODONE) 5 MG immediate release tablet Take 1 tablet (5 mg total) by mouth every 4 (four) hours as needed. 60 tablet 0  . polyethylene glycol powder (MIRALAX) powder Take 17 g by mouth daily. 255 g 1  . prochlorperazine (COMPAZINE) 10 MG tablet Take 1 tablet (10 mg total) by mouth every 6 (six) hours as needed (Nausea or vomiting). 30 tablet 1  . silver sulfADIAZINE (SILVADENE) 1 % cream Apply 1 application topically 2 (two) times daily. Apply a thin layer twice daily to affected area.    . valACYclovir (VALTREX) 1000 MG tablet Take 1 tablet (1,000 mg total) by mouth 2 (two) times daily. 20 tablet 0   No current facility-administered medications for this encounter.      ALLERGIES: Patient has no known allergies.   LABORATORY DATA:  Lab  Results  Component Value Date   WBC 3.7 (L) 01/20/2017   HGB 13.2 01/20/2017   HCT 38.6 01/20/2017   MCV 86.0 01/20/2017   PLT 170 01/20/2017   Lab Results  Component Value Date   NA 136 01/20/2017   K 3.4 (L) 01/20/2017   CL 102 10/29/2016   CO2 27 01/20/2017   Lab Results  Component Value Date   ALT 31 01/20/2017   AST 21 01/20/2017   ALKPHOS 79 01/20/2017   BILITOT 0.46 01/20/2017     NARRATIVE: Juan Harrison was seen today for weekly treatment management. The chart was checked and the patient's films were reviewed.  The patient complains of significant irritation in the genital region. Continues to use pain medication and requesting refill today. He has one more treatment remaining.  PHYSICAL EXAMINATION: vitals were not taken for this visit.     The patient's skin looks quite good for where he is in his treatment with hyperpigmentation and some dry desquamation. He states that that area feels very dry but I'm pleased that there is no major area of moist desquamation, especially anteriorly in the scrotal region.  ASSESSMENT: The patient is doing satisfactorily with treatment. The patient has one more fraction of treatment which we will finish Monday. I believe his skin will significantly improve after he finishes treatment and we have given him some structure in skin terms of skin care.  PLAN: We will continue  with the patient's radiation treatment as planned.   He will return in approximately 2-3 weeks after completing his course of radiation treatment.   ------------------------------------------------  Jodelle Gross, MD, PhD

## 2017-01-27 ENCOUNTER — Ambulatory Visit (HOSPITAL_BASED_OUTPATIENT_CLINIC_OR_DEPARTMENT_OTHER): Payer: Medicare Other | Admitting: Hematology

## 2017-01-27 ENCOUNTER — Other Ambulatory Visit (HOSPITAL_BASED_OUTPATIENT_CLINIC_OR_DEPARTMENT_OTHER): Payer: Medicare Other

## 2017-01-27 ENCOUNTER — Encounter: Payer: Self-pay | Admitting: Hematology

## 2017-01-27 ENCOUNTER — Ambulatory Visit: Payer: Medicare Other

## 2017-01-27 ENCOUNTER — Ambulatory Visit
Admission: RE | Admit: 2017-01-27 | Discharge: 2017-01-27 | Disposition: A | Discharge status: Home / Self Care | Payer: Medicare Other | Source: Ambulatory Visit | Attending: Radiation Oncology | Admitting: Radiation Oncology

## 2017-01-27 VITALS — BP 112/82 | HR 102 | Temp 98.2°F | Resp 18 | Ht 75.0 in | Wt 135.9 lb

## 2017-01-27 DIAGNOSIS — K625 Hemorrhage of anus and rectum: Secondary | ICD-10-CM | POA: Diagnosis not present

## 2017-01-27 DIAGNOSIS — F17210 Nicotine dependence, cigarettes, uncomplicated: Secondary | ICD-10-CM | POA: Diagnosis not present

## 2017-01-27 DIAGNOSIS — C210 Malignant neoplasm of anus, unspecified: Secondary | ICD-10-CM

## 2017-01-27 DIAGNOSIS — B20 Human immunodeficiency virus [HIV] disease: Secondary | ICD-10-CM | POA: Diagnosis not present

## 2017-01-27 DIAGNOSIS — Z510 Encounter for antineoplastic radiation therapy: Secondary | ICD-10-CM | POA: Diagnosis not present

## 2017-01-27 LAB — CBC WITH DIFFERENTIAL/PLATELET
BASO%: 0.1 % (ref 0.0–2.0)
Basophils Absolute: 0 10*3/uL (ref 0.0–0.1)
EOS%: 7.3 % — AB (ref 0.0–7.0)
Eosinophils Absolute: 0.1 10*3/uL (ref 0.0–0.5)
HCT: 38.9 % (ref 38.4–49.9)
HEMOGLOBIN: 13 g/dL (ref 13.0–17.1)
LYMPH%: 6 % — AB (ref 14.0–49.0)
MCH: 29.7 pg (ref 27.2–33.4)
MCHC: 33.4 g/dL (ref 32.0–36.0)
MCV: 88.9 fL (ref 79.3–98.0)
MONO#: 0.2 10*3/uL (ref 0.1–0.9)
MONO%: 9.9 % (ref 0.0–14.0)
NEUT%: 76.7 % — ABNORMAL HIGH (ref 39.0–75.0)
NEUTROS ABS: 1.5 10*3/uL (ref 1.5–6.5)
Platelets: 126 10*3/uL — ABNORMAL LOW (ref 140–400)
RBC: 4.38 10*6/uL (ref 4.20–5.82)
RDW: 15 % — AB (ref 11.0–14.6)
WBC: 2 10*3/uL — AB (ref 4.0–10.3)
lymph#: 0.1 10*3/uL — ABNORMAL LOW (ref 0.9–3.3)

## 2017-01-27 LAB — COMPREHENSIVE METABOLIC PANEL
ALBUMIN: 3.5 g/dL (ref 3.5–5.0)
ALK PHOS: 80 U/L (ref 40–150)
ALT: 35 U/L (ref 0–55)
AST: 24 U/L (ref 5–34)
Anion Gap: 8 mEq/L (ref 3–11)
BUN: 10.6 mg/dL (ref 7.0–26.0)
CO2: 26 meq/L (ref 22–29)
Calcium: 9.7 mg/dL (ref 8.4–10.4)
Chloride: 104 mEq/L (ref 98–109)
Creatinine: 1 mg/dL (ref 0.7–1.3)
EGFR: >90 mL/min/{1.73_m2} (ref >90)
GLUCOSE: 117 mg/dL (ref 70–140)
POTASSIUM: 4.2 meq/L (ref 3.5–5.1)
SODIUM: 138 meq/L (ref 136–145)
TOTAL PROTEIN: 8.2 g/dL (ref 6.4–8.3)
Total Bilirubin: 0.69 mg/dL (ref 0.20–1.20)

## 2017-01-28 ENCOUNTER — Ambulatory Visit: Payer: Medicare Other

## 2017-01-29 ENCOUNTER — Telehealth: Payer: Self-pay | Admitting: *Deleted

## 2017-01-29 ENCOUNTER — Ambulatory Visit: Payer: Medicare Other

## 2017-01-29 MED FILL — oxyCODONE HCL 5 MG TABS: 5 | 10 days supply | Qty: 60 | Fill #0

## 2017-01-29 NOTE — Telephone Encounter (Signed)
Patient +left voice message that his b/l groin area is still has skin irritation and very painful, creturned call, and asked if he still had silvadene cream to use in those areas ?, I haven't used any but have enouugh, " stated patient, will try applying silvadene  Bid, in am and pm, will have to remove/wash off silvadene before reapplying, he states"I have to sleep with a pillow between my legs so painful", hse is taking pain medication, will call us Monday iof no relief and will come in earlier for follow up appt,  10:38 AM

## 2017-01-30 ENCOUNTER — Telehealth: Payer: Self-pay | Admitting: *Deleted

## 2017-01-30 ENCOUNTER — Ambulatory Visit: Payer: Medicare Other

## 2017-01-30 NOTE — Telephone Encounter (Signed)
Patient called asking for something different than silvadene, stated"it's not helping, suggested [place] soaks from the pharmacy and go by the directions, also buy imodium for his diarrhea, stay away from fresh fruits,fresh vegetables, and milk products , take an imodium after each diarrhea episode up to 8 tabs 24 hour period, brat diet is advised, patient stated he thinks he is passing gas then diarrhea a happens, he will try these and call me on Monday to see if he is better, thanked Rn for the information 9:55 AM

## 2017-01-31 ENCOUNTER — Ambulatory Visit: Payer: Medicare Other

## 2017-02-04 NOTE — Progress Notes (Signed)
Juan Harrison  [phone] [phone]) [phone]  Clinic follow up Note   Patient Care Team: No Pcp Per Patient as PCP - General (General Practice) Campbell Riches, MD as Consulting Physician (Infectious Diseases) Leighton Ruff, MD as Consulting Physician (General Surgery) Kyung Rudd, MD as Consulting Physician (Radiation Oncology) 02/10/2017   CHIEF COMPLAINTS:  Follow up anal cancer  Oncology History   Anal cancer ([place])   Staging form: Anus, AJCC 8th Edition   - Clinical: Stage IIIA (cT2, cN1a, cM0) - Signed by Truitt Merle, MD on 12/10/2016      Anal cancer ([place])   11/29/2016 Initial Biopsy    Anal canal mass biopsy showed 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 Marcello Moores in OR:"There was an obvious anal mass noted in the left posterior lateral anal canal.  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   I then placed a Hill-Ferguson anoscope into the anal canal and evaluated this completely.  The mass was noted with an area of central necrosis invading into the sphincter complex.  The edges of the ulcer were trimmed away using scissors.  This was sent to pathology for further examination       12/06/2016 Imaging    CT chest, abdomen and pelvis with contrast showed apparent wall thickening in the distal rectum and anus, this is associated with 2 posterior left pelvic sidewall nodes, highly suspicious for metastatic disease. 10 mm right upper lobe pulmonary nodule has essential stippled calcification and associated calcified nodule tissue in the right hilum, probably granuloma. No other distant metastasis.       12/10/2016 Initial Diagnosis    Anal cancer ([place])     12/13/2016 Imaging    NM PET Image Initial (pi) Skull Base to Thigh  IMPRESSION: 1. Intensely hypermetabolic 4.0 x 3.3 x 6.3 cm anal  mass, compatible with known primary anal carcinoma. 2. Hypermetabolic bilateral internal iliac nodal metastases. 3. Hypermetabolic left inguinal nodal metastasis. 4. Solitary mildly hypermetabolic nonenlarged left para-aortic lymph node, suspicious for nodal metastasis. 5. No additional definite sites of hypermetabolic metastatic disease. 6. Nonspecific nonenlarged mildly hypermetabolic bilateral upper neck lymph nodes, statistically most likely to represent benign reactive nodes. 7. Mild paraseptal emphysema       12/16/2016 - 01/27/2017 Radiation Therapy    Definitive radiation to his anal cancer, 55 Gy in 30 fractions       12/16/2016 - 01/24/2017 Chemotherapy    Concurrent chemo Mitomycin 10mg /m2 on day 1 and 35 and 5-fu 1000mg /m2/day on day 1-5 and 35-39 (second cycle postponed due to scheduling issue), pt tolerated well          HISTORY OF PRESENTING ILLNESS (12/11/2015):  Juan Harrison 46 y.o. male with past medical history of HIV, is here because of His recent diagnosed anal cancer. He is accompanied by his sister to the clinic today.  He has had bloody BM and rectal pain for one month, his rectal bleeding is mild, mixed with stool, no blood clots. He rates his rectal pain at 7-8/10, especially with bowel movement. he takes oxycodone 1-2/day.   Patient was seen by colorectal surgeon Dr. Marcello Moores, could not tolerate rectal exam and anoscope in office, was brought to the OR for the above exam, which showed " There was an obvious anal mass noted in the left posterior lateral anal canal.  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   I then placed a Hill-Ferguson anoscope into the anal canal and evaluated this completely.  The mass was noted with an area of central necrosis invading into the sphincter complex.  The edges of the ulcer were trimmed away using scissors.  This was sent to pathology for further examination". The biopsy showed squamous cell  carcinoma.  He was found to be HIV positive in 2007, has been seen Per ID Dr. [name], not very compliant. He previously on  DRVr/TRV/EPZ bactrim, changed to genvoya 05-2016.  His last CD4 was 110 on 10/29/2016.   He is single, lives alone in a two story townhouse. He does not work, on [employer]. He has a sister [name] who lives in [place]. He has some friends, only able to help if he needs.  CURRENT THERAPY: Surveillance  INTERIM HISTORY:  Juan Harrison returns for follow up. He has finished radiation and has an appointment with Dr. Lisbeth Renshaw in a month. He has been doing okay. His skin is still dry and tight from radiation. He is still very tired, and naps often. He has been walking, sitting, and eating better. Denies pain, nausea, rectal bleeding, or any other concerns.   MEDICAL HISTORY:  Past Medical History:  Diagnosis Date  . AIDS [facility]) infectious disease--- dr hatcher   dx 2007,  usCD4,  count at 110  . Anal pain   . Cancer ([place]) 11/29/2016   anal invasive mod diff squamous cell ca w/squamous cell ca in situ  . Condyloma   . Hepatitis B surface antigen positive     SURGICAL HISTORY: Past Surgical History:  Procedure Laterality Date  . IR GENERIC HISTORICAL  12/16/2016   IR US GUIDE VASC ACCESS RIGHT 12/16/2016 Arne Cleveland, MD [facility]-INTERV RAD  . IR GENERIC HISTORICAL  12/16/2016   IR FLUORO GUIDE CV LINE RIGHT 12/16/2016 Arne Cleveland, MD [facility]INTER[facility]  . RECTAL BIOPSY N/A 11/29/2016   Procedure: BIOPSY ANAL CANAL MASS;  Surgeon: Leighton Ruff, MD;  Location: [facility];  Service: General;  Laterality: N/A;  . RECTAL EXAM UNDER ANESTHESIA N/A 11/29/2016   Procedure: EXAM UNDER ANESTHESIA;  Surgeon: Leighton Ruff, MD;  Location: [facility];  Service: General;  Laterality: N/A;  . 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 Marital status: Single    Spouse name: N/A  . Number of  children: N/A  . Years of education: N/A   Occupational History  . Not on file.   Social History Main Topics  . Smoking status: Current Every Day Smoker    Packs/day: 0.25    Years: 20.00    Types: Cigarettes    Last attempt to quit: 10/13/2016  . Smokeless tobacco: Never Used     Comment: 2-3 cigarettes a day   . Alcohol use No     Comment: He used to drink alcohol on weekends for 25 years, he quit on 10/14/2016   . Drug use: Yes    Types: Marijuana  . Sexual activity: Not on file   Other Topics Concern  . Not on file   Social History Narrative   Single, lives alone about 10 minutes from [place] in 2 story townhouse   Has one sister, [name] who lives in [place] in past via friends   On disability  FAMILY HISTORY: Family History  Problem Relation Age of Onset  . COPD Mother   . Asthma Brother     ALLERGIES:  has No Known Allergies.  MEDICATIONS:  Current Outpatient Prescriptions  Medication Sig Dispense Refill  . acetaminophen (TYLENOL) 500 MG tablet Take 500 mg by mouth every 6 (six) hours as needed.    . elvitegravir-cobicistat-emtricitabine-tenofovir (GENVOYA) 150-150-200-10 MG TABS tablet Take 1 tablet by mouth daily with breakfast. 90 tablet 3  . ibuprofen (ADVIL,MOTRIN) 200 MG tablet Take 200 mg by mouth as needed.    Marland Kitchen morphine (MS CONTIN) 15 MG 12 hr tablet Take 1 tablet (15 mg total) by mouth every 12 (twelve) hours. 30 tablet 0  . ondansetron (ZOFRAN) 8 MG tablet Take 1 tablet (8 mg total) by mouth 2 (two) times daily as needed (Nausea or vomiting). 30 tablet 1  . oxyCODONE (OXY IR/ROXICODONE) 5 MG immediate release tablet Take 1 tablet (5 mg total) by mouth every 4 (four) hours as needed. 60 tablet 0  . prochlorperazine (COMPAZINE) 10 MG tablet Take 1 tablet (10 mg total) by mouth every 6 (six) hours as needed (Nausea or vomiting). 30 tablet 1  . silver sulfADIAZINE (SILVADENE) 1 % cream Apply 1 application topically 2 (two) times  daily. Apply a thin layer twice daily to affected area.    . valACYclovir (VALTREX) 1000 MG tablet Take 1 tablet (1,000 mg total) by mouth 2 (two) times daily. (Patient taking differently: Take 1,000 mg by mouth daily. ) 20 tablet 0  . polyethylene glycol powder (MIRALAX) powder Take 17 g by mouth daily. (Patient not taking: Reported on 01/27/2017) 255 g 1   No current facility-administered medications for this visit.     REVIEW OF SYSTEMS:   Constitutional: Denies fevers, chills or abnormal night sweats (+) fatigue  Eyes: Denies blurriness of vision, double vision or watery eyes Ears, nose, mouth, throat, and face: Denies mucositis or sore throat Respiratory: Denies cough, dyspnea or wheezes Cardiovascular: Denies palpitation, chest discomfort or lower extremity swelling Gastrointestinal:  Denies nausea, heartburn or change in bowel habits Skin: Denies abnormal skin rashes (+) tightness and degradation at radiation site.  Lymphatics: Denies new lymphadenopathy or easy bruising Neurological:Denies numbness, tingling or new weaknesses Behavioral/Psych: Mood is stable, no new changes  All other systems were reviewed with the patient and are negative.  PHYSICAL EXAMINATION: ECOG PERFORMANCE STATUS: 1 - Symptomatic but completely ambulatory  Vitals:   02/10/17 1148  BP: 110/80  Pulse: 87  Resp: 18  Temp: 98.1 F (36.7 C)   Filed Weights   02/10/17 1148  Weight: 138 lb 8 oz (62.8 kg)    GENERAL:alert, no distress and comfortable SKIN: skin color, texture, turgor are normal, no rashes or significant lesions EYES: normal, conjunctiva are pink and non-injected, sclera clear OROPHARYNX:no exudate, no erythema and lips, buccal mucosa, and tongue normal  NECK: supple, thyroid normal size, non-tender, without nodularity LYMPH:  no palpable lymphadenopathy in the cervical, axillary or inguinal LUNGS: clear to auscultation and percussion with normal breathing effort HEART: regular rate &  rhythm and no murmurs and no lower extremity edema ABDOMEN:abdomen soft, non-tender and normal bowel sounds, rectal exam was deferred  Musculoskeletal:no cyanosis of digits and no clubbing  Anus: (+) multiple small skin ulcers in the skin folder between the buttocks and at the right groin and testicular have healed, no visible mass at his anus, rectal exam was deferred due to his pain   PSYCH: alert & oriented x 3  with fluent speech NEURO: no focal motor/sensory deficits  LABORATORY DATA:  I have reviewed the data as listed CBC Latest Ref Rng & Units 02/10/2017 01/27/2017 01/20/2017  WBC 4.0 - 10.3 10e3/uL 2.6(L) 2.0(L) 3.7(L)  Hemoglobin 13.0 - 17.1 g/dL 12.2(L) 13.0 13.2  Hematocrit 38.4 - 49.9 % 36.1(L) 38.9 38.6  Platelets 140 - 400 10e3/uL 201 126(L) 170   CMP Latest Ref Rng & Units 02/10/2017 01/27/2017 01/20/2017  Glucose 70 - 140 mg/dl 98 117 137  BUN 7.0 - 26.0 mg/dL 10.0 10.6 14.0  Creatinine 0.7 - 1.3 mg/dL 1.0 1.0 1.1  Sodium 136 - 145 mEq/L 142 138 136  Potassium 3.5 - 5.1 mEq/L 3.1(L) 4.2 3.4(L)  Chloride 98 - 110 mmol/L - - -  CO2 22 - 29 mEq/L 29 26 27   Calcium 8.4 - 10.4 mg/dL 9.0 9.7 10.1  Total Protein 6.4 - 8.3 g/dL 7.4 8.2 8.2  Total Bilirubin 0.20 - 1.20 mg/dL 0.31 0.69 0.46  Alkaline Phos 40 - 150 U/L 79 80 79  AST 5 - 34 U/L 23 24 21   ALT 0 - 55 U/L 31 35 31   PATHOLOGY REPORT  Diagnosis 11/29/2016 Anus, biopsy, anal canal mass - INVASIVE MODERATELY DIFFERENTIATED SQUAMOUS CELL CARCINOMA ASSOCIATED WITH SQUAMOUS CELL CARCINOMA IN SITU.  RADIOGRAPHIC STUDIES: I have personally reviewed the radiological images as listed and agreed with the findings in the report.  PET 12/13/2016 IMPRESSION: 1. Intensely hypermetabolic 4.0 x 3.3 x 6.3 cm anal mass, compatible with known primary anal carcinoma. 2. Hypermetabolic bilateral internal iliac nodal metastases. 3. Hypermetabolic left inguinal nodal metastasis. 4. Solitary mildly hypermetabolic nonenlarged left  para-aortic lymph node, suspicious for nodal metastasis. 5. No additional definite sites of hypermetabolic metastatic disease. 6. Nonspecific nonenlarged mildly hypermetabolic bilateral upper neck lymph nodes, statistically most likely to represent benign reactive nodes. 7. Mild paraseptal emphysema .  ASSESSMENT & PLAN:  46 y.o. African-American male, with history of HIV and AIDS, on genvoya since 03/2016,presents with rectal bleeding and pain.  1.  Anal cancer, invasive squamous cell carcinoma,cT2N1aM0, stage IIIA -I previously reviewed his CT scan findings and anal mass biopsy results in details with patient and his sister. -His previous CT scan showed significantly intact left internal iliac lymph node, highly suspicious for metastasis. No other distant metastasis on CT scan -He has locally advanced anal cancer, I discussed the standard therapy with concurrent chemotherapy and radiation. We usually preserve surgery for residual cancer after chemotherapy and radiation, or recurrent disease. -We previously discussed that his anal cancer will likely be cured by concurrent chemotherapy and radiation, although his HIV and low CD4 count may predicts high risk for chemoradiation, medications, and poor prognosis for his anal cancer. -he now just completed the standard radiation and chemotherapy regiment with mitomycin and 5-FU infusion, received full dose for 2 cycles, second cycle was postponed for one week due to scheduling issues. -The goal of therapy is curative -He overall tolerated chemotherapy well, did not have severe cytopenias. -He is recovering well from chemoradiation. Lab results reviewed with patient. -We again discussed the surveillance plan. We'll likely repeat a PET scan in 3 months to evaluate his response to chemotherapy and irradiation. If no residual disease, we'll see him every 3-6 months with surveillance scans every 6-12 months for up to 5 years  -He finished radiation on 2/26  and chemo on 2/24. He says he does not want any further chemo or surgery even if he residual or recurrent disease.  -I encouraged  him to eat healthy and exercise.   2. Rectal bleeding and pain  -Secondary to his anal cancer and radiation dermatitis. His bleeding has stopped, still has moderate pain. -He takes oxycodone as needed for his rectal pain, but his pain has been worse. -Taking MS Contin 15mg  q12h  -Improving since finishing radiation  3. HIV/AIDS -His treatment was switched to genvoya in 05/2016, his CD4 has improved since then, but overall still low -continue HIV therapy when on chemo, will check with Dr. [name]    4. Social support  -His sister is involved with his care -Social worker will meet with him today to help him arrange this transportation, etc.  Plan -I will see him back in 2.5 months for follow up, with a restaging PET    All questions were answered. The patient knows to call the clinic with any problems, questions or concerns.  I spent 15 minutes counseling the patient face to face. The total time spent in the appointment was 20 minutes and more than 50% was on counseling.  This document serves as a record of services personally performed by Truitt Merle, MD. It was created on her behalf by Martinique Casey, a trained medical scribe. The creation of this record is based on the scribe's personal observations and the provider's statements to them. This document has been checked and approved by the attending provider.  I have reviewed the above documentation for accuracy and completeness and I agree with the above.   Truitt Merle, MD 02/10/2017

## 2017-02-08 ENCOUNTER — Telehealth: Payer: Self-pay | Admitting: Hematology

## 2017-02-08 NOTE — Telephone Encounter (Signed)
Added lab to 3/12 f/u per 2/26 los. Spoke with patient.

## 2017-02-10 ENCOUNTER — Encounter: Payer: Self-pay | Admitting: Hematology

## 2017-02-10 ENCOUNTER — Encounter: Payer: Self-pay | Admitting: Radiation Oncology

## 2017-02-10 ENCOUNTER — Ambulatory Visit (HOSPITAL_BASED_OUTPATIENT_CLINIC_OR_DEPARTMENT_OTHER): Payer: Medicare Other | Admitting: Hematology

## 2017-02-10 ENCOUNTER — Other Ambulatory Visit (HOSPITAL_BASED_OUTPATIENT_CLINIC_OR_DEPARTMENT_OTHER): Payer: Medicare Other

## 2017-02-10 VITALS — BP 110/80 | HR 87 | Temp 98.1°F | Resp 18 | Ht 75.0 in | Wt 138.5 lb

## 2017-02-10 DIAGNOSIS — C211 Malignant neoplasm of anal canal: Secondary | ICD-10-CM

## 2017-02-10 DIAGNOSIS — G893 Neoplasm related pain (acute) (chronic): Secondary | ICD-10-CM

## 2017-02-10 DIAGNOSIS — C210 Malignant neoplasm of anus, unspecified: Secondary | ICD-10-CM

## 2017-02-10 DIAGNOSIS — K625 Hemorrhage of anus and rectum: Secondary | ICD-10-CM | POA: Diagnosis not present

## 2017-02-10 DIAGNOSIS — B20 Human immunodeficiency virus [HIV] disease: Secondary | ICD-10-CM

## 2017-02-10 LAB — CBC WITH DIFFERENTIAL/PLATELET
BASO%: 0.8 % (ref 0.0–2.0)
Basophils Absolute: 0 10*3/uL (ref 0.0–0.1)
EOS ABS: 0.1 10*3/uL (ref 0.0–0.5)
EOS%: 3.1 % (ref 0.0–7.0)
HCT: 36.1 % — ABNORMAL LOW (ref 38.4–49.9)
HGB: 12.2 g/dL — ABNORMAL LOW (ref 13.0–17.1)
LYMPH%: 18.7 % (ref 14.0–49.0)
MCH: 29.9 pg (ref 27.2–33.4)
MCHC: 33.8 g/dL (ref 32.0–36.0)
MCV: 88.5 fL (ref 79.3–98.0)
MONO#: 0.5 10*3/uL (ref 0.1–0.9)
MONO%: 17.9 % — AB (ref 0.0–14.0)
NEUT%: 59.5 % (ref 39.0–75.0)
NEUTROS ABS: 1.5 10*3/uL (ref 1.5–6.5)
Platelets: 201 10*3/uL (ref 140–400)
RBC: 4.08 10*6/uL — AB (ref 4.20–5.82)
RDW: 15.8 % — ABNORMAL HIGH (ref 11.0–14.6)
WBC: 2.6 10*3/uL — AB (ref 4.0–10.3)
lymph#: 0.5 10*3/uL — ABNORMAL LOW (ref 0.9–3.3)

## 2017-02-10 LAB — COMPREHENSIVE METABOLIC PANEL
ALT: 31 U/L (ref 0–55)
AST: 23 U/L (ref 5–34)
Albumin: 3.2 g/dL — ABNORMAL LOW (ref 3.5–5.0)
Alkaline Phosphatase: 79 U/L (ref 40–150)
Anion Gap: 8 mEq/L (ref 3–11)
BILIRUBIN TOTAL: 0.31 mg/dL (ref 0.20–1.20)
BUN: 10 mg/dL (ref 7.0–26.0)
CO2: 29 mEq/L (ref 22–29)
Calcium: 9 mg/dL (ref 8.4–10.4)
Chloride: 105 mEq/L (ref 98–109)
Creatinine: 1 mg/dL (ref 0.7–1.3)
Glucose: 98 mg/dl (ref 70–140)
POTASSIUM: 3.1 meq/L — AB (ref 3.5–5.1)
Sodium: 142 mEq/L (ref 136–145)
TOTAL PROTEIN: 7.4 g/dL (ref 6.4–8.3)

## 2017-02-10 MED FILL — GENVOYA TABLET: 150-150-200 | 30 days supply | Qty: 30 | Fill #0

## 2017-02-10 NOTE — Progress Notes (Signed)
  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 During treatment, the patient reported significant irritation in the genital region for which he uses pain medication. Hyperpigmentation with some dry desquamation noted in the treatment area.  Plan: The patient has completed radiation treatment. The patient will return to radiation oncology clinic for routine followup in one month. I advised them to call or return sooner if they have any questions or concerns related to their recovery or treatment.  ------------------------------------------------  Jodelle Gross, MD, PhD  This document serves as a record of services personally performed by Kyung Rudd, MD. It was created on his behalf by Bethann Humble, a trained medical scribe. The creation of this record is based on the scribe's personal observations and the provider's statements to them. This document has been checked and approved by the attending provider.

## 2017-02-24 NOTE — Progress Notes (Addendum)
Juan Harrison 47 y.o. man with Unstaged squamous cell carcinoma of the anus radiation completed 01-27-17 one month FU.   Pain:0/10 Nausea/ Vomiting:None Diarrhea:Having normal bowel movements daily Skin irritation:Less irritation to the anal area Fatigue:Having less fatigue. Loss of appetite::Appetite has improved eating two to three meals per day. Weight: Wt Readings from Last 3 Encounters:  03/06/17 147 lb (66.7 kg)  02/10/17 138 lb 8 oz (62.8 kg)  01/27/17 135 lb 14.4 oz (61.6 kg)   02-19-17 Saw Dr. Corky Downs likely repeat a PET scan in 3 months to evaluate his response to chemotherapy and irradiation. If no residual disease, we'll see him every 3-6 months with surveillance scans every 6-12 months for up to 5 years  He says he does not want any further chemo or surgery even if he residual or recurrent disease.  -I encouraged him to eat healthy and exercise.  BP (!) 87/58   Pulse 90   Temp 97.8 F (36.6 C) (Oral)   Resp 18   Ht 6\' 3"  (1.905 m)   Wt 147 lb (66.7 kg)   SpO2 100%   BMI 18.37 kg/m        Right arm sitting BP 103/68   Pulse (!) 108   Temp 97.8 F (36.6 C) (Oral)   Resp 18   Ht 6\' 3"  (1.905 m)   Wt 147 lb (66.7 kg)   SpO2 100%   BMI 18.37 kg/m    Right arm standing

## 2017-02-25 ENCOUNTER — Ambulatory Visit: Payer: Self-pay | Admitting: Radiation Oncology

## 2017-03-03 ENCOUNTER — Inpatient Hospital Stay: Admission: RE | Admit: 2017-03-03 | Payer: Self-pay | Source: Ambulatory Visit | Admitting: Radiation Oncology

## 2017-03-06 ENCOUNTER — Encounter: Payer: Self-pay | Admitting: Radiation Oncology

## 2017-03-06 ENCOUNTER — Ambulatory Visit
Admission: RE | Admit: 2017-03-06 | Discharge: 2017-03-06 | Disposition: A | Discharge status: Home / Self Care | Payer: Medicare Other | Source: Ambulatory Visit | Attending: Radiation Oncology | Admitting: Radiation Oncology

## 2017-03-06 VITALS — BP 103/68 | HR 108 | Temp 97.8°F | Resp 18 | Ht 75.0 in | Wt 147.0 lb

## 2017-03-06 DIAGNOSIS — Z510 Encounter for antineoplastic radiation therapy: Secondary | ICD-10-CM | POA: Insufficient documentation

## 2017-03-06 DIAGNOSIS — C210 Malignant neoplasm of anus, unspecified: Secondary | ICD-10-CM | POA: Diagnosis not present

## 2017-03-06 DIAGNOSIS — B20 Human immunodeficiency virus [HIV] disease: Secondary | ICD-10-CM | POA: Insufficient documentation

## 2017-03-06 DIAGNOSIS — F17210 Nicotine dependence, cigarettes, uncomplicated: Secondary | ICD-10-CM | POA: Insufficient documentation

## 2017-03-07 NOTE — Progress Notes (Signed)
Radiation Oncology         [phone] ________________________________  Name: Juan Harrison MRN: [id number]  Date: 03/06/2017  DOB: 10-29-1971  Post Treatment Note  CC: No PCP Per Patient  Leighton Ruff, MD  Diagnosis:   Stage IIIA, T2N1aM0 squamous cell carcinoma of the anus.   Interval Since Last Radiation:  5 weeks   12/16/16-01/27/17: Anus and bilateral groins treated to 50.4 Gy in 30 fractions   Narrative:  The patient returns today for routine follow-up.  He did have significant trouble with desquamation and pain during his treatment requiring narcotic therapy. Fortunately this did not interrupt his treatments. He is due for his first anoscopy in May with Dr. Rober Minion.                              On review of systems, the patient states he's feeling much better than the last time he was in clinic. He reports no difficulty with rectal bleeding, diarrhea, abominal pain, or skin disruption. He is eating better and trying to gain back the weight he lost during treatment. No other complaints are noted.  ALLERGIES:  has No Known Allergies.  Meds: Current Outpatient Prescriptions  Medication Sig Dispense Refill  . acetaminophen (TYLENOL) 500 MG tablet Take 500 mg by mouth every 6 (six) hours as needed.    . elvitegravir-cobicistat-emtricitabine-tenofovir (GENVOYA) 150-150-200-10 MG TABS tablet Take 1 tablet by mouth daily with breakfast. 90 tablet 3  . morphine (MS CONTIN) 15 MG 12 hr tablet Take 1 tablet (15 mg total) by mouth every 12 (twelve) hours. 30 tablet 0  . oxyCODONE (OXY IR/ROXICODONE) 5 MG immediate release tablet Take 1 tablet (5 mg total) by mouth every 4 (four) hours as needed. 60 tablet 0  . polyethylene glycol powder (MIRALAX) powder Take 17 g by mouth daily. 255 g 1  . silver sulfADIAZINE (SILVADENE) 1 % cream Apply 1 application topically 2 (two) times daily. Apply a thin layer twice daily to affected area.    . valACYclovir (VALTREX) 1000 MG tablet Take 1  tablet (1,000 mg total) by mouth 2 (two) times daily. (Patient taking differently: Take 1,000 mg by mouth daily. ) 20 tablet 0  . ibuprofen (ADVIL,MOTRIN) 200 MG tablet Take 200 mg by mouth as needed.    . ondansetron (ZOFRAN) 8 MG tablet Take 1 tablet (8 mg total) by mouth 2 (two) times daily as needed (Nausea or vomiting). (Patient not taking: Reported on 03/06/2017) 30 tablet 1  . prochlorperazine (COMPAZINE) 10 MG tablet Take 1 tablet (10 mg total) by mouth every 6 (six) hours as needed (Nausea or vomiting). (Patient not taking: Reported on 03/06/2017) 30 tablet 1   No current facility-administered medications for this encounter.     Physical Findings:  height is 6\' 3"  (1.905 m) and weight is 147 lb (66.7 kg). His oral temperature is 97.8 F (36.6 C). His blood pressure is 103/68 and his pulse is 108 (abnormal). His respiration is 18 and oxygen saturation is 100%.  Pain Assessment Pain Score:  (Right arm)/10 In general this is a well appearing thin African American male in no acute distress. He's alert and oriented x4 and appropriate throughout the examination. Cardiopulmonary assessment is negative for acute distress and he exhibits normal effort. The groins are assessed and minimal hyperpigmentation is noted. There is no break down. The anus is inspected and no visible tumor is noted. Less perianal warts are seen today  than during previous treatment assessments, though at least 10 still remain. The skin is healed and no fissuring or open skin is seen.  Lab Findings: Lab Results  Component Value Date   WBC 2.6 (L) 02/10/2017   HGB 12.2 (L) 02/10/2017   HCT 36.1 (L) 02/10/2017   MCV 88.5 02/10/2017   PLT 201 02/10/2017     Radiographic Findings: No results found.  Impression/Plan: 1. Stage IIIA, T2N1aM0 squamous cell carcinoma of the anus. The patient is doing much better since completing treatment. He's no longer in pain, and is eating and drinking well. He will proceed with anoscopy  evaluation with Dr. Marcello Moores in May and also follow up with Dr. Burr Medico in May. I will plan to see him annually as he will be seen for anoscopy q3 months, and also see medical oncology. He will call if he has questions or concerns prior to his next visit. 2. HIV. The patient will continue with his infectious disease providers for his CD4 and Viral load and continue his retroviral therapy. 3. Perianal warts. The patient's disease has improved, and we will defer additional treatment options for this to Dr. Marcello Moores.     Carola Rhine, PAC

## 2017-04-08 DIAGNOSIS — C211 Malignant neoplasm of anal canal: Secondary | ICD-10-CM | POA: Diagnosis not present

## 2017-04-25 ENCOUNTER — Ambulatory Visit (HOSPITAL_COMMUNITY): Payer: Medicare Other

## 2017-04-30 ENCOUNTER — Other Ambulatory Visit: Payer: Medicare Other

## 2017-04-30 ENCOUNTER — Telehealth: Payer: Self-pay | Admitting: Hematology

## 2017-04-30 ENCOUNTER — Ambulatory Visit: Payer: Medicare Other | Admitting: Hematology

## 2017-04-30 NOTE — Telephone Encounter (Signed)
sw pt to confirm r/s appts to 6/1 at 245 pm

## 2017-05-01 ENCOUNTER — Ambulatory Visit (HOSPITAL_COMMUNITY)
Admission: RE | Admit: 2017-05-01 | Discharge: 2017-05-01 | Disposition: A | Discharge status: Home / Self Care | Payer: Medicare Other | Source: Ambulatory Visit | Attending: Hematology | Admitting: Hematology

## 2017-05-01 DIAGNOSIS — C44520 Squamous cell carcinoma of anal skin: Secondary | ICD-10-CM | POA: Diagnosis not present

## 2017-05-01 DIAGNOSIS — Z79899 Other long term (current) drug therapy: Secondary | ICD-10-CM | POA: Insufficient documentation

## 2017-05-01 DIAGNOSIS — C210 Malignant neoplasm of anus, unspecified: Secondary | ICD-10-CM | POA: Diagnosis not present

## 2017-05-01 LAB — GLUCOSE, CAPILLARY: GLUCOSE-CAPILLARY: 118 mg/dL — AB (ref 65–99)

## 2017-05-01 MED ORDER — FLUDEOXYGLUCOSE F - 18 (FDG) INJECTION
7.5000 | Freq: Once | INTRAVENOUS | Status: AC | PRN
  Administered 2017-05-01: 7.5 via INTRAVENOUS

## 2017-05-02 ENCOUNTER — Other Ambulatory Visit (HOSPITAL_BASED_OUTPATIENT_CLINIC_OR_DEPARTMENT_OTHER): Payer: Medicare Other

## 2017-05-02 ENCOUNTER — Ambulatory Visit (HOSPITAL_BASED_OUTPATIENT_CLINIC_OR_DEPARTMENT_OTHER): Payer: Medicare Other | Admitting: Hematology

## 2017-05-02 VITALS — BP 89/58 | HR 77 | Temp 98.0°F | Resp 18 | Ht 75.0 in | Wt 142.5 lb

## 2017-05-02 DIAGNOSIS — K625 Hemorrhage of anus and rectum: Secondary | ICD-10-CM

## 2017-05-02 DIAGNOSIS — B20 Human immunodeficiency virus [HIV] disease: Secondary | ICD-10-CM | POA: Diagnosis not present

## 2017-05-02 DIAGNOSIS — C210 Malignant neoplasm of anus, unspecified: Secondary | ICD-10-CM | POA: Diagnosis not present

## 2017-05-02 DIAGNOSIS — G893 Neoplasm related pain (acute) (chronic): Secondary | ICD-10-CM | POA: Diagnosis not present

## 2017-05-02 LAB — COMPREHENSIVE METABOLIC PANEL
ALBUMIN: 3.4 g/dL — AB (ref 3.5–5.0)
ALT: 17 U/L (ref 0–55)
ANION GAP: 10 meq/L (ref 3–11)
AST: 18 U/L (ref 5–34)
Alkaline Phosphatase: 86 U/L (ref 40–150)
BUN: 8.3 mg/dL (ref 7.0–26.0)
CALCIUM: 9 mg/dL (ref 8.4–10.4)
CHLORIDE: 106 meq/L (ref 98–109)
CO2: 28 mEq/L (ref 22–29)
Creatinine: 1.2 mg/dL (ref 0.7–1.3)
EGFR: 84 mL/min/{1.73_m2} — ABNORMAL LOW (ref >90)
Glucose: 116 mg/dl (ref 70–140)
POTASSIUM: 3.6 meq/L (ref 3.5–5.1)
Sodium: 144 mEq/L (ref 136–145)
Total Bilirubin: 0.42 mg/dL (ref 0.20–1.20)
Total Protein: 6.9 g/dL (ref 6.4–8.3)

## 2017-05-02 LAB — CBC WITH DIFFERENTIAL/PLATELET
BASO%: 0.7 % (ref 0.0–2.0)
BASOS ABS: 0 10*3/uL (ref 0.0–0.1)
EOS ABS: 0.3 10*3/uL (ref 0.0–0.5)
EOS%: 6.3 % (ref 0.0–7.0)
HEMATOCRIT: 39.5 % (ref 38.4–49.9)
HEMOGLOBIN: 12.9 g/dL — AB (ref 13.0–17.1)
LYMPH#: 0.8 10*3/uL — AB (ref 0.9–3.3)
LYMPH%: 17.7 % (ref 14.0–49.0)
MCH: 30.5 pg (ref 27.2–33.4)
MCHC: 32.7 g/dL (ref 32.0–36.0)
MCV: 93.1 fL (ref 79.3–98.0)
MONO#: 0.6 10*3/uL (ref 0.1–0.9)
MONO%: 13 % (ref 0.0–14.0)
NEUT#: 2.9 10*3/uL (ref 1.5–6.5)
NEUT%: 62.3 % (ref 39.0–75.0)
PLATELETS: 263 10*3/uL (ref 140–400)
RBC: 4.24 10*6/uL (ref 4.20–5.82)
RDW: 14 % (ref 11.0–14.6)
WBC: 4.6 10*3/uL (ref 4.0–10.3)

## 2017-05-02 NOTE — Progress Notes (Signed)
[name]  [phone] [phone]  Clinic follow up Note   Patient Care Team: Patient, No Pcp Per as PCP - General (General Practice) [name] [name], MD as Consulting Physician (Infectious Diseases) Leighton Ruff, MD as Consulting Physician (General Surgery) Kyung Rudd, MD as Consulting Physician (Radiation Oncology) 05/02/2017   CHIEF COMPLAINTS:  Follow up anal cancer  Oncology History   Anal cancer ([place])   Staging form: Anus, AJCC 8th Edition   - Clinical: Stage IIIA (cT2, cN1a, cM0) - Signed by Truitt Merle, MD on 12/10/2016      Anal cancer ([place])   11/29/2016 Initial Biopsy    Anal canal mass biopsy showed 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 Marcello Moores in OR:"There was an obvious anal mass noted in the left posterior lateral anal canal.  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   I then placed a Hill-Ferguson anoscope into the anal canal and evaluated this completely.  The mass was noted with an area of central necrosis invading into the sphincter complex.  The edges of the ulcer were trimmed away using scissors.  This was sent to pathology for further examination       12/06/2016 Imaging    CT chest, abdomen and pelvis with contrast showed apparent wall thickening in the distal rectum and anus, this is associated with 2 posterior left pelvic sidewall nodes, highly suspicious for metastatic disease. 10 mm right upper lobe pulmonary nodule has essential stippled calcification and associated calcified nodule tissue in the right hilum, probably granuloma. No other distant metastasis.       12/10/2016 Initial Diagnosis    Anal cancer ([place])     12/13/2016 Imaging    NM PET Image Initial (pi) Skull Base to Thigh  IMPRESSION: 1. Intensely hypermetabolic 4.0 x 3.3 x 6.3 cm anal  mass, compatible with known primary anal carcinoma. 2. Hypermetabolic bilateral internal iliac nodal metastases. 3. Hypermetabolic left inguinal nodal metastasis. 4. Solitary mildly hypermetabolic nonenlarged left para-aortic lymph node, suspicious for nodal metastasis. 5. No additional definite sites of hypermetabolic metastatic disease. 6. Nonspecific nonenlarged mildly hypermetabolic bilateral upper neck lymph nodes, statistically most likely to represent benign reactive nodes. 7. Mild paraseptal emphysema       12/16/2016 - 01/27/2017 Radiation Therapy    Definitive radiation to his anal cancer, 55 Gy in 30 fractions       12/16/2016 - 01/24/2017 Chemotherapy    Concurrent chemo Mitomycin 10mg /m2 on day 1 and 35 and 5-fu 1000mg /m2/day on day 1-5 and 35-39 (second cycle postponed due to scheduling issue), pt tolerated well         05/01/2017 Imaging    PET  IMPRESSION: 1. No convincing findings of residual anal neoplasm. No residual discrete anal mass on the CT images. Vague mild anal hypermetabolism (max SUV 3.9), markedly decreased, with mild circumferential anal wall thickening, favored to represent post treatment change. 2. No convincing findings of residual metastatic disease. Solitary mildly hypermetabolic nonenlarged left level 2 neck lymph node is decreased in size and metabolism and is statistically more likely to represent a benign reactive node. No residual hypermetabolism within the previously visualized left para-aortic, left inguinal or internal iliac lymph nodes.       HISTORY OF PRESENTING ILLNESS (12/11/2015):  Juan Harrison 46 y.o. male with past medical history of HIV,  is here because of His recent diagnosed anal cancer. He is accompanied by his sister to the clinic today.  He has had bloody BM and rectal pain for one month, his rectal bleeding is mild, mixed with stool, no blood clots. He rates his rectal pain at 7-8/10, especially with bowel movement. he takes  oxycodone 1-2/day.   Patient was seen by colorectal surgeon Dr. Marcello Moores, could not tolerate rectal exam and anoscope in office, was brought to the OR for the above exam, which showed " There was an obvious anal mass noted in the left posterior lateral anal canal.  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   I then placed a Hill-Ferguson anoscope into the anal canal and evaluated this completely.  The mass was noted with an area of central necrosis invading into the sphincter complex.  The edges of the ulcer were trimmed away using scissors.  This was sent to pathology for further examination". The biopsy showed squamous cell carcinoma.  He was found to be HIV positive in 2007, has been seen Per ID Dr. [name], not very compliant. He previously on  DRVr/TRV/EPZ bactrim, changed to genvoya 05-2016.  His last CD4 was 110 on 10/29/2016.   He is single, lives alone in a two story townhouse. He does not work, on [employer]. He has a sister Juan Harrison who lives in [place]. He has some friends, only able to help if he needs.  CURRENT THERAPY: Surveillance.   INTERIM HISTORY:  [name] returns for follow up. He felt a little light headed earlier. He is doing well and has no complaints. He denies chest discomfort or stomach issues. He has lost some weight recently. He has a low appetite on some days. He denies any depression. He denies any pain with bowel movements  MEDICAL HISTORY:  Past Medical History:  Diagnosis Date  . AIDS [facility]) infectious disease--- dr hatcher   dx 2007,  usCD4,  count at 110  . Anal pain   . Cancer ([place]) 11/29/2016   anal invasive mod diff squamous cell ca w/squamous cell ca in situ  . Condyloma   . Hepatitis B surface antigen positive     SURGICAL HISTORY: Past Surgical History:  Procedure Laterality Date  . IR GENERIC HISTORICAL  12/16/2016   IR US GUIDE VASC ACCESS RIGHT 12/16/2016 Arne Cleveland, MD [facility]-INTERV RAD  .  IR GENERIC HISTORICAL  12/16/2016   IR FLUORO GUIDE CV LINE RIGHT 12/16/2016 Arne Cleveland, MD [facility]INTER[facility]  . RECTAL BIOPSY N/A 11/29/2016   Procedure: BIOPSY ANAL CANAL MASS;  Surgeon: Leighton Ruff, MD;  Location: [facility];  Service: General;  Laterality: N/A;  . RECTAL EXAM UNDER ANESTHESIA N/A 11/29/2016   Procedure: EXAM UNDER ANESTHESIA;  Surgeon: Leighton Ruff, MD;  Location: [facility];  Service: General;  Laterality: N/A;  . 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 Marital status: Single    Spouse name: N/A  . Number of children: N/A  . Years of education: N/A   Occupational History  . Not on file.   Social History Main Topics  . Smoking status: Current Every Day Smoker    Packs/day: 0.25    Years: 20.00    Types: Cigarettes    Last attempt to quit: 10/13/2016  . Smokeless tobacco: Never Used     Comment: 2-3 cigarettes a  day   . Alcohol use No     Comment: He used to drink alcohol on weekends for 25 years, he quit on 10/14/2016   . Drug use: Yes    Types: Marijuana  . Sexual activity: Not on file   Other Topics Concern  . Not on file   Social History Narrative   Single, lives alone about 10 minutes from [place] in 2 story townhouse   Has one sister, Juan Harrison who lives in [place] in past via friends   On disability    FAMILY HISTORY: Family History  Problem Relation Age of Onset  . COPD Mother   . Asthma Brother     ALLERGIES:  has No Known Allergies.  MEDICATIONS:  Current Outpatient Prescriptions  Medication Sig Dispense Refill  . elvitegravir-cobicistat-emtricitabine-tenofovir (GENVOYA) 150-150-200-10 MG TABS tablet Take 1 tablet by mouth daily with breakfast. 90 tablet 3  . ondansetron (ZOFRAN) 8 MG tablet Take 1 tablet (8 mg total) by mouth 2 (two) times daily as needed (Nausea or vomiting). 30 tablet 1  . polyethylene glycol powder  (MIRALAX) powder Take 17 g by mouth daily. 255 g 1  . valACYclovir (VALTREX) 1000 MG tablet Take 1 tablet (1,000 mg total) by mouth 2 (two) times daily. (Patient taking differently: Take 1,000 mg by mouth daily. ) 20 tablet 0  . acetaminophen (TYLENOL) 500 MG tablet Take 500 mg by mouth every 6 (six) hours as needed.    Marland Kitchen ibuprofen (ADVIL,MOTRIN) 200 MG tablet Take 200 mg by mouth as needed.    . prochlorperazine (COMPAZINE) 10 MG tablet Take 1 tablet (10 mg total) by mouth every 6 (six) hours as needed (Nausea or vomiting). (Patient not taking: Reported on 03/06/2017) 30 tablet 1  . silver sulfADIAZINE (SILVADENE) 1 % cream Apply 1 application topically 2 (two) times daily. Apply a thin layer twice daily to affected area.     No current facility-administered medications for this visit.     REVIEW OF SYSTEMS:   Constitutional: Denies fevers, chills or abnormal night sweats (+) fatigue, loss of appetite  Eyes: Denies blurriness of vision, double vision or watery eyes Ears, nose, mouth, throat, and face: Denies mucositis or sore throat Respiratory: Denies cough, dyspnea or wheezes Cardiovascular: Denies palpitation, chest discomfort or lower extremity swelling Gastrointestinal:  Denies nausea, heartburn or change in bowel habits Skin: Denies abnormal skin rashes  Lymphatics: Denies new lymphadenopathy or easy bruising Neurological:Denies numbness, tingling or new weaknesses Behavioral/Psych: Mood is stable, no new changes  All other systems were reviewed with the patient and are negative.  PHYSICAL EXAMINATION: ECOG PERFORMANCE STATUS: 1 - Symptomatic but completely ambulatory  Vitals:   05/02/17 1436  BP: (!) 89/58  Pulse: 77  Resp: 18  Temp: 98 F (36.7 C)   Filed Weights   05/02/17 1436  Weight: 142 lb 8 oz (64.6 kg)    GENERAL:alert, no distress and comfortable SKIN: skin color, texture, turgor are normal, no rashes or significant lesions EYES: normal, conjunctiva are pink and  non-injected, sclera clear OROPHARYNX:no exudate, no erythema and lips, buccal mucosa, and tongue normal  NECK: supple, thyroid normal size, non-tender, without nodularity LYMPH:  no palpable lymphadenopathy in the cervical, axillary or inguinal LUNGS: clear to auscultation and percussion with normal breathing effort HEART: regular rate & rhythm and no murmurs and no lower extremity edema ABDOMEN:abdomen soft, non-tender and normal bowel sounds, inspection showed no mass at anus, no palpable mass on the rectal exam, she  had a moderate pain with the rectal exam Musculoskeletal:no cyanosis of digits and no clubbing PSYCH: alert & oriented x 3 with fluent speech NEURO: no focal motor/sensory deficits  LABORATORY DATA:  I have reviewed the data as listed CBC Latest Ref Rng & Units 05/02/2017 02/10/2017 01/27/2017  WBC 4.0 - 10.3 10e3/uL 4.6 2.6(L) 2.0(L)  Hemoglobin 13.0 - 17.1 g/dL 12.9(L) 12.2(L) 13.0  Hematocrit 38.4 - 49.9 % 39.5 36.1(L) 38.9  Platelets 140 - 400 10e3/uL 263 201 126(L)   CMP Latest Ref Rng & Units 05/02/2017 02/10/2017 01/27/2017  Glucose 70 - 140 mg/dl 116 98 117  BUN 7.0 - 26.0 mg/dL 8.3 10.0 10.6  Creatinine 0.7 - 1.3 mg/dL 1.2 1.0 1.0  Sodium 136 - 145 mEq/L 144 142 138  Potassium 3.5 - 5.1 mEq/L 3.6 3.1(L) 4.2  Chloride 98 - 110 mmol/L - - -  CO2 22 - 29 mEq/L 28 29 26   Calcium 8.4 - 10.4 mg/dL 9.0 9.0 9.7  Total Protein 6.4 - 8.3 g/dL 6.9 7.4 8.2  Total Bilirubin 0.20 - 1.20 mg/dL 0.42 0.31 0.69  Alkaline Phos 40 - 150 U/L 86 79 80  AST 5 - 34 U/L 18 23 24   ALT 0 - 55 U/L 17 31 35   PATHOLOGY REPORT  Diagnosis 11/29/2016 Anus, biopsy, anal canal mass - INVASIVE MODERATELY DIFFERENTIATED SQUAMOUS CELL CARCINOMA ASSOCIATED WITH SQUAMOUS CELL CARCINOMA IN SITU.  RADIOGRAPHIC STUDIES: I have personally reviewed the radiological images as listed and agreed with the findings in the report.  PET 5/31/208 IMPRESSION: 1. No convincing findings of residual anal  neoplasm. No residual discrete anal mass on the CT images. Vague mild anal hypermetabolism (max SUV 3.9), markedly decreased, with mild circumferential anal wall thickening, favored to represent post treatment change. 2. No convincing findings of residual metastatic disease. Solitary mildly hypermetabolic nonenlarged left level 2 neck lymph node is decreased in size and metabolism and is statistically more likely to represent a benign reactive node. No residual hypermetabolism within the previously visualized left para-aortic, left inguinal or internal iliac lymph nodes.  PET 12/13/2016 IMPRESSION: 1. Intensely hypermetabolic 4.0 x 3.3 x 6.3 cm anal mass, compatible with known primary anal carcinoma. 2. Hypermetabolic bilateral internal iliac nodal metastases. 3. Hypermetabolic left inguinal nodal metastasis. 4. Solitary mildly hypermetabolic nonenlarged left para-aortic lymph node, suspicious for nodal metastasis. 5. No additional definite sites of hypermetabolic metastatic disease. 6. Nonspecific nonenlarged mildly hypermetabolic bilateral upper neck lymph nodes, statistically most likely to represent benign reactive nodes. 7. Mild paraseptal emphysema .  ASSESSMENT & PLAN:  46 y.o. African-American male, with history of HIV and AIDS, on genvoya since 03/2016,presents with rectal bleeding and pain.  1.  Anal cancer, invasive squamous cell carcinoma,cT2N1aM0, stage IIIA -I previously reviewed his CT scan findings and anal mass biopsy results in details with patient and his sister. -His previous CT scan showed significantly intact left internal iliac lymph node, highly suspicious for metastasis. No other distant metastasis on CT scan -He has locally advanced anal cancer, I discussed the standard therapy with concurrent chemotherapy and radiation. We usually preserve surgery for residual cancer after chemotherapy and radiation, or recurrent disease. -We previously discussed that his anal  cancer will likely be cured by concurrent chemotherapy and radiation, although his HIV and low CD4 count may predicts high risk for chemoradiation, medications, and poor prognosis for his anal cancer. -he now just completed the standard radiation and chemotherapy regiment with mitomycin and 5-FU infusion, received full dose for 2  cycles, second cycle was postponed for one week due to scheduling issues. -He overall tolerated chemotherapy well, did not have severe cytopenias. -He is recovering well from chemoradiation. Lab results reviewed with patient. -I reviewed his surveillance PET scan from 05/01/2017, which showed a complete metabolic response to treatment. -due to his previous hypermetabolic retroperitoneal lymph nodes, he is at very high risk for recurrence. Repeat scan in 6 months - his blood pressure is low today, but not symptomatic  -  follow up in 4 months - he has been losing weight lately. I strongly encouraged him to eat more so he can gain weight  2. Rectal bleeding and pain  -Secondary to his anal cancer and radiation dermatitis.  -Complete resolved after treatment. He is off narcotics.  3. HIV/AIDS -His treatment was switched to genvoya in 05/2016, his CD4 has improved since then, but overall still low -continue HIV therapy when on chemo, will check with Dr. [name]    4. Social support  -His sister is involved with his care -Social worker will meet with him today to help him arrange this transportation, etc.  Plan - I will see him again in 4 months for follow up - repeat scan in 6 months   All questions were answered. The patient knows to call the clinic with any problems, questions or concerns.  I spent 20 minutes counseling the patient face to face. The total time spent in the appointment was 25 minutes and more than 50% was on counseling.  This document serves as a record of services personally performed by Truitt Merle, MD. It was created on her behalf by Brandt Loosen, a trained medical scribe. The creation of this record is based on the scribe's personal observations and the provider's statements to them. This document has been checked and approved by the attending provider.  I have reviewed the above documentation for accuracy and completeness and I agree with the above.   Truitt Merle, MD 05/02/2017

## 2017-05-03 ENCOUNTER — Encounter: Payer: Self-pay | Admitting: Hematology

## 2017-05-05 MED FILL — GENVOYA TABLET: 150-150-200 | 30 days supply | Qty: 30 | Fill #1

## 2017-05-07 ENCOUNTER — Telehealth: Payer: Self-pay | Admitting: Hematology

## 2017-05-07 NOTE — Telephone Encounter (Signed)
Patient bypassed scheduling on 05/02/17. Appointments scheduled and confirmed with patient, per 05/07/17 los.

## 2017-05-28 ENCOUNTER — Other Ambulatory Visit: Payer: Self-pay | Admitting: Infectious Diseases

## 2017-05-28 DIAGNOSIS — B20 Human immunodeficiency virus [HIV] disease: Secondary | ICD-10-CM

## 2017-06-16 DIAGNOSIS — C211 Malignant neoplasm of anal canal: Secondary | ICD-10-CM | POA: Diagnosis not present

## 2017-06-27 ENCOUNTER — Ambulatory Visit: Payer: Medicare Other

## 2017-07-01 ENCOUNTER — Other Ambulatory Visit: Payer: Self-pay | Admitting: Pharmacist

## 2017-07-01 ENCOUNTER — Other Ambulatory Visit: Payer: Self-pay | Admitting: Infectious Diseases

## 2017-07-01 DIAGNOSIS — B20 Human immunodeficiency virus [HIV] disease: Secondary | ICD-10-CM

## 2017-07-01 MED FILL — GENVOYA TABLET: 150-150-200 | 30 days supply | Qty: 30 | Fill #0

## 2017-07-01 NOTE — Addendum Note (Signed)
Addended by: Darletta Moll on: 07/01/2017 03:35 PM   Modules accepted: Orders

## 2017-07-08 ENCOUNTER — Ambulatory Visit: Payer: Medicare Other

## 2017-07-14 ENCOUNTER — Other Ambulatory Visit: Payer: Self-pay | Admitting: General Surgery

## 2017-07-14 DIAGNOSIS — C211 Malignant neoplasm of anal canal: Secondary | ICD-10-CM | POA: Diagnosis not present

## 2017-07-14 NOTE — H&P (Signed)
History of Present Illness (Juan Tirpak MD; 07/14/2017 10:35 AM) The patient is a 46 year old male who presents with anal pain. 46-year-old male with AIDS who presents to the office for evaluation of anal condyloma. The patient has AIDS with a CD4 count at 110. HIV viral loads or undetectable though. H presented to the office c/o severe anal pain, occasional bleeding. He appears to be somewhat non-compliant with his medications. He underwent anal EUA and bx and was noted to have a stage IIIa anal cancer and underwent treatment for this with chemotherapy and radiation in January and February 2018. He had some skin discomfort with radiation treatment but otherwise tolerated this well. He is now having bowel movements without difficulty. He denies any rectal bleeding.   Problem List/Past Medical (Juan Andujo, MD; 07/14/2017 10:35 AM) ANAL OR RECTAL PAIN (K62.89)  CANCER OF ANAL CANAL (C21.1)   Past Surgical History (Juan Whelpley, MD; 07/14/2017 10:35 AM) Foot Surgery  Right. Oral Surgery   Allergies (Juan Harrison, RMA; 07/14/2017 10:27 AM) No Known Drug Allergies 11/14/2016 Allergies Reconciled   Medication History (Juan Harrison, RMA; 07/14/2017 10:27 AM) Genvoya (150-150-200-10MG Tablet, Oral two times daily) Active. ValACYclovir HCl (1GM Tablet, Oral daily) Active. Medications Reconciled  Social History (Juan Proch, MD; 07/14/2017 10:35 AM) Alcohol use  Recently quit alcohol use. Caffeine use  Coffee. Illicit drug use  Uses socially only. Tobacco use  Current every day smoker.  Family History (Juan Furney, MD; 07/14/2017 10:35 AM) Cancer  Family Members In General. Respiratory Condition  Brother.  Other Problems (Juan Guardiola, MD; 07/14/2017 10:35 AM) Anxiety Disorder  Bladder Problems  Hemorrhoids  HIV-positive     Review of Systems (Juan Harter MD; 07/14/2017 10:35 AM) General Present- Fatigue. Not Present- Appetite Loss, Chills,  Fever, Night Sweats, Weight Gain and Weight Loss. Skin Present- Change in Wart/Mole. Not Present- Dryness, Hives, Jaundice, New Lesions, Non-Healing Wounds, Rash and Ulcer. HEENT Not Present- Earache, Hearing Loss, Hoarseness, Nose Bleed, Oral Ulcers, Ringing in the Ears, Seasonal Allergies, Sinus Pain, Sore Throat, Visual Disturbances, Wears glasses/contact lenses and Yellow Eyes. Respiratory Not Present- Bloody sputum, Chronic Cough, Difficulty Breathing, Snoring and Wheezing. Cardiovascular Not Present- Chest Pain, Difficulty Breathing Lying Down, Leg Cramps, Palpitations, Rapid Heart Rate, Shortness of Breath and Swelling of Extremities. Gastrointestinal Present- Bloody Stool, Change in Bowel Habits, Chronic diarrhea, Constipation, Excessive gas, Hemorrhoids and Rectal Pain. Not Present- Abdominal Pain, Bloating, Difficulty Swallowing, Gets full quickly at meals, Indigestion, Nausea and Vomiting. Male Genitourinary Present- Painful Urination. Not Present- Blood in Urine, Change in Urinary Stream, Frequency, Impotence, Nocturia, Urgency and Urine Leakage. Neurological Present- Tingling and Trouble walking. Not Present- Decreased Memory, Fainting, Headaches, Numbness, Seizures, Tremor and Weakness. Psychiatric Present- Anxiety and Change in Sleep Pattern. Not Present- Bipolar, Depression, Fearful and Frequent crying. Hematology Present- HIV. Not Present- Blood Thinners, Easy Bruising, Excessive bleeding, Gland problems and Persistent Infections.  Vitals (Juan Harrison RMA; 07/14/2017 10:27 AM) 07/14/2017 10:27 AM Weight: 141.2 lb Height: 75in Body Surface Area: 1.89 m Body Mass Index: 17.65 kg/m  Temp.: 97.2F  Pulse: 93 (Regular)  P.OX: 99% (Room air) BP: 116/78 (Sitting, Left Arm, Standard)       Physical Exam ([name] MD; 07/14/2017 10:36 AM) General Mental Status-Alert. General Appearance-Not in acute distress. Build & Nutrition-Well  nourished. Posture-Normal posture. Gait-Normal.  Head and Neck Head-normocephalic, atraumatic with no lesions or palpable masses. Trachea-midline.  Chest and Lung Exam Chest and lung exam reveals -on auscultation, normal breath sounds,   no adventitious sounds and normal vocal resonance.  Cardiovascular Cardiovascular examination reveals -normal heart sounds, regular rate and rhythm with no murmurs and no digital clubbing, cyanosis, edema, increased warmth or tenderness.  Abdomen Inspection Inspection of the abdomen reveals - No Hernias. Palpation/Percussion Palpation and Percussion of the abdomen reveal - Soft, Non Tender, No Rigidity (guarding), No hepatosplenomegaly and No Palpable abdominal masses.  Rectal Anorectal Exam External - Note: min scarring noted. Internal - Note: Anal nodule at posterior midline. Significant tenderness to palpation circumferentially. No other obvious masses noted.  Neurologic Neurologic evaluation reveals -alert and oriented x 3 with no impairment of recent or remote memory, normal attention span and ability to concentrate, normal sensation and normal coordination.  Musculoskeletal Normal Exam - Bilateral-Upper Extremity Strength Normal and Lower Extremity Strength Normal.    Assessment & Plan ([name] MD; 07/14/2017 10:37 AM) CANCER OF ANAL CANAL (C21.1) Impression: 46-year-old male with HIV who presents to the office for follow-up after radiation therapy approximately 6 months ago for anal cancer. On exam today he continues to have a painful posterior midline anal nodule. I'm concerned that he has recurrent cancer in the area. I recommended an anal exam under anesthesia with biopsy. 

## 2017-07-22 NOTE — Telephone Encounter (Signed)
Chart review.

## 2017-07-28 ENCOUNTER — Encounter (HOSPITAL_BASED_OUTPATIENT_CLINIC_OR_DEPARTMENT_OTHER): Payer: Self-pay | Admitting: *Deleted

## 2017-07-28 NOTE — Progress Notes (Signed)
NPO AFTER MN.  ARRIVE AT [id number].  NEEDS HG. WILL TAKE AM MEDS W/ SIPS OF WATER.

## 2017-08-01 ENCOUNTER — Encounter (HOSPITAL_BASED_OUTPATIENT_CLINIC_OR_DEPARTMENT_OTHER)
Admission: RE | Disposition: A | Discharge status: Home / Self Care | Payer: Self-pay | Source: Ambulatory Visit | Attending: General Surgery

## 2017-08-01 ENCOUNTER — Ambulatory Visit (HOSPITAL_BASED_OUTPATIENT_CLINIC_OR_DEPARTMENT_OTHER)
Admission: RE | Admit: 2017-08-01 | Discharge: 2017-08-01 | Disposition: A | Discharge status: Home / Self Care | Payer: Medicare Other | Source: Ambulatory Visit | Attending: General Surgery | Admitting: General Surgery

## 2017-08-01 ENCOUNTER — Encounter (HOSPITAL_BASED_OUTPATIENT_CLINIC_OR_DEPARTMENT_OTHER): Payer: Self-pay | Admitting: *Deleted

## 2017-08-01 ENCOUNTER — Ambulatory Visit (HOSPITAL_BASED_OUTPATIENT_CLINIC_OR_DEPARTMENT_OTHER): Payer: Medicare Other | Admitting: Anesthesiology

## 2017-08-01 DIAGNOSIS — Z21 Asymptomatic human immunodeficiency virus [HIV] infection status: Secondary | ICD-10-CM | POA: Insufficient documentation

## 2017-08-01 DIAGNOSIS — K626 Ulcer of anus and rectum: Secondary | ICD-10-CM | POA: Diagnosis not present

## 2017-08-01 DIAGNOSIS — F17200 Nicotine dependence, unspecified, uncomplicated: Secondary | ICD-10-CM | POA: Insufficient documentation

## 2017-08-01 DIAGNOSIS — C211 Malignant neoplasm of anal canal: Secondary | ICD-10-CM | POA: Diagnosis not present

## 2017-08-01 DIAGNOSIS — Z85048 Personal history of other malignant neoplasm of rectum, rectosigmoid junction, and anus: Secondary | ICD-10-CM | POA: Insufficient documentation

## 2017-08-01 DIAGNOSIS — A630 Anogenital (venereal) warts: Secondary | ICD-10-CM | POA: Diagnosis not present

## 2017-08-01 DIAGNOSIS — K6282 Dysplasia of anus: Secondary | ICD-10-CM | POA: Insufficient documentation

## 2017-08-01 DIAGNOSIS — Z79899 Other long term (current) drug therapy: Secondary | ICD-10-CM | POA: Insufficient documentation

## 2017-08-01 DIAGNOSIS — K6289 Other specified diseases of anus and rectum: Secondary | ICD-10-CM | POA: Diagnosis not present

## 2017-08-01 DIAGNOSIS — B20 Human immunodeficiency virus [HIV] disease: Secondary | ICD-10-CM | POA: Diagnosis not present

## 2017-08-01 HISTORY — PX: RECTAL EXAM UNDER ANESTHESIA: SHX6399

## 2017-08-01 HISTORY — DX: Personal history of irradiation: Z92.3

## 2017-08-01 HISTORY — DX: Personal history of antineoplastic chemotherapy: Z92.21

## 2017-08-01 HISTORY — DX: Anogenital (venereal) warts: A63.0

## 2017-08-01 HISTORY — DX: Solitary pulmonary nodule: R91.1

## 2017-08-01 HISTORY — DX: Malignant neoplasm of anus, unspecified: C21.0

## 2017-08-01 HISTORY — DX: Other specified diseases of anus and rectum: K62.89

## 2017-08-01 SURGERY — EXAM UNDER ANESTHESIA, RECTUM
Anesthesia: Monitor Anesthesia Care | Site: Anus

## 2017-08-01 MED ORDER — LACTATED RINGERS IV SOLN
INTRAVENOUS | Status: DC
  Administered 2017-08-01 (×2): via INTRAVENOUS
  Filled 2017-08-01: qty 1000

## 2017-08-01 MED ORDER — MIDAZOLAM HCL 5 MG/5ML IJ SOLN
INTRAMUSCULAR | Status: DC | PRN
  Administered 2017-08-01: 2 mg via INTRAVENOUS

## 2017-08-01 MED ORDER — LIDOCAINE 2% (20 MG/ML) 5 ML SYRINGE
INTRAMUSCULAR | Status: DC | PRN
  Administered 2017-08-01: 60 mg via INTRAVENOUS

## 2017-08-01 MED ORDER — FENTANYL CITRATE (PF) 100 MCG/2ML IJ SOLN
INTRAMUSCULAR | Status: DC | PRN
  Administered 2017-08-01: 50 ug via INTRAVENOUS

## 2017-08-01 MED ORDER — MIDAZOLAM HCL 2 MG/2ML IJ SOLN
INTRAMUSCULAR | Status: AC
  Filled 2017-08-01: qty 2

## 2017-08-01 MED ORDER — BUPIVACAINE-EPINEPHRINE 0.5% -1:200000 IJ SOLN
INTRAMUSCULAR | Status: DC | PRN
  Administered 2017-08-01: 30 mL

## 2017-08-01 MED ORDER — SODIUM CHLORIDE 0.9% FLUSH
3.0000 mL | INTRAVENOUS | Status: DC | PRN
  Filled 2017-08-01: qty 3

## 2017-08-01 MED ORDER — ACETAMINOPHEN 325 MG PO TABS
650.0000 mg | ORAL_TABLET | ORAL | Status: DC | PRN
  Filled 2017-08-01: qty 2

## 2017-08-01 MED ORDER — LIDOCAINE 2% (20 MG/ML) 5 ML SYRINGE
INTRAMUSCULAR | Status: AC
  Filled 2017-08-01: qty 5

## 2017-08-01 MED ORDER — SODIUM CHLORIDE 0.9% FLUSH
3.0000 mL | Freq: Two times a day (BID) | INTRAVENOUS | Status: DC
  Filled 2017-08-01: qty 3

## 2017-08-01 MED ORDER — OXYCODONE HCL 5 MG PO TABS
5.0000 mg | ORAL_TABLET | ORAL | Status: DC | PRN
  Filled 2017-08-01: qty 2

## 2017-08-01 MED ORDER — FENTANYL CITRATE (PF) 100 MCG/2ML IJ SOLN
25.0000 ug | INTRAMUSCULAR | Status: DC | PRN
  Filled 2017-08-01: qty 1

## 2017-08-01 MED ORDER — ONDANSETRON HCL 4 MG/2ML IJ SOLN
4.0000 mg | Freq: Once | INTRAMUSCULAR | Status: DC | PRN
  Filled 2017-08-01: qty 2

## 2017-08-01 MED ORDER — SODIUM CHLORIDE 0.9 % IV SOLN
250.0000 mL | INTRAVENOUS | Status: DC | PRN
  Filled 2017-08-01: qty 250

## 2017-08-01 MED ORDER — PROPOFOL 10 MG/ML IV BOLUS
INTRAVENOUS | Status: DC | PRN
  Administered 2017-08-01: 30 mg via INTRAVENOUS

## 2017-08-01 MED ORDER — FENTANYL CITRATE (PF) 100 MCG/2ML IJ SOLN
INTRAMUSCULAR | Status: AC
  Filled 2017-08-01: qty 2

## 2017-08-01 MED ORDER — ACETAMINOPHEN 650 MG RE SUPP
650.0000 mg | RECTAL | Status: DC | PRN
Start: 2017-08-01 — End: 2017-08-01
  Filled 2017-08-01: qty 1

## 2017-08-01 MED ORDER — PROPOFOL 500 MG/50ML IV EMUL
INTRAVENOUS | Status: DC | PRN
  Administered 2017-08-01: 75 ug/kg/min via INTRAVENOUS

## 2017-08-01 MED ORDER — PROPOFOL 500 MG/50ML IV EMUL
INTRAVENOUS | Status: AC
  Filled 2017-08-01: qty 50

## 2017-08-01 MED ORDER — HYDROCODONE-ACETAMINOPHEN 5-325 MG PO TABS: 1.0000 | ORAL_TABLET | Freq: Four times a day (QID) | ORAL | 0 refills | Status: DC | PRN

## 2017-08-01 MED ORDER — LIDOCAINE 5 % EX OINT
TOPICAL_OINTMENT | CUTANEOUS | Status: DC | PRN
  Administered 2017-08-01: 1

## 2017-08-01 SURGICAL SUPPLY — 53 items
BENZOIN TINCTURE PRP APPL 2/3 (GAUZE/BANDAGES/DRESSINGS) ×3 IMPLANT
BLADE HEX COATED 2.75 (ELECTRODE) ×3 IMPLANT
BLADE SURG 10 STRL SS (BLADE) IMPLANT
BLADE SURG 15 STRL LF DISP TIS (BLADE) IMPLANT
BLADE SURG 15 STRL SS (BLADE)
BRIEF STRETCH FOR OB PAD LRG (UNDERPADS AND DIAPERS) ×3 IMPLANT
CANISTER SUCT 3000ML PPV (MISCELLANEOUS) ×3 IMPLANT
COVER BACK TABLE 60X90IN (DRAPES) ×3 IMPLANT
COVER MAYO STAND STRL (DRAPES) ×3 IMPLANT
DECANTER SPIKE VIAL GLASS SM (MISCELLANEOUS) ×3 IMPLANT
DRAPE LAPAROTOMY 100X72 PEDS (DRAPES) ×3 IMPLANT
DRAPE LG THREE QUARTER DISP (DRAPES) ×6 IMPLANT
DRAPE UNDERBUTTOCKS STRL (DRAPE) IMPLANT
DRAPE UTILITY XL STRL (DRAPES) ×3 IMPLANT
ELECT BLADE 6.5 .24CM SHAFT (ELECTRODE) IMPLANT
ELECT REM PT RETURN 9FT ADLT (ELECTROSURGICAL) ×3
ELECTRODE REM PT RTRN 9FT ADLT (ELECTROSURGICAL) ×1 IMPLANT
GAUZE SPONGE 4X4 12PLY STRL (GAUZE/BANDAGES/DRESSINGS) ×3 IMPLANT
GAUZE SPONGE 4X4 12PLY STRL LF (GAUZE/BANDAGES/DRESSINGS) ×3 IMPLANT
GAUZE SPONGE 4X4 16PLY XRAY LF (GAUZE/BANDAGES/DRESSINGS) IMPLANT
GLOVE BIO SURGEON STRL SZ 6.5 (GLOVE) ×4 IMPLANT
GLOVE BIO SURGEON STRL SZ7.5 (GLOVE) ×3 IMPLANT
GLOVE BIO SURGEONS STRL SZ 6.5 (GLOVE) ×2
GLOVE BIOGEL PI IND STRL 6.5 (GLOVE) ×1 IMPLANT
GLOVE BIOGEL PI IND STRL 7.5 (GLOVE) ×1 IMPLANT
GLOVE BIOGEL PI INDICATOR 6.5 (GLOVE) ×2
GLOVE BIOGEL PI INDICATOR 7.5 (GLOVE) ×2
GLOVE INDICATOR 7.0 STRL GRN (GLOVE) ×6 IMPLANT
GOWN SPEC L3 XXLG W/TWL (GOWN DISPOSABLE) ×3 IMPLANT
GOWN STRL REUS W/TWL LRG LVL3 (GOWN DISPOSABLE) ×6 IMPLANT
KIT RM TURNOVER CYSTO AR (KITS) IMPLANT
LEGGING LITHOTOMY PAIR STRL (DRAPES) IMPLANT
LOOP VESSEL MAXI BLUE (MISCELLANEOUS) IMPLANT
NDL SAFETY ECLIPSE 18X1.5 (NEEDLE) IMPLANT
NEEDLE HYPO 18GX1.5 SHARP (NEEDLE)
NEEDLE HYPO 22GX1.5 SAFETY (NEEDLE) ×3 IMPLANT
NS IRRIG 500ML POUR BTL (IV SOLUTION) ×3 IMPLANT
PACK BASIN DAY SURGERY FS (CUSTOM PROCEDURE TRAY) ×3 IMPLANT
PAD ABD 8X10 STRL (GAUZE/BANDAGES/DRESSINGS) ×3 IMPLANT
PAD ARMBOARD 7.5X6 YLW CONV (MISCELLANEOUS) IMPLANT
PENCIL BUTTON HOLSTER BLD 10FT (ELECTRODE) ×3 IMPLANT
SPONGE SURGIFOAM ABS GEL 12-7 (HEMOSTASIS) IMPLANT
SUT CHROMIC 2 0 SH (SUTURE) IMPLANT
SUT ETHIBOND 0 (SUTURE) IMPLANT
SUT GUT CHROMIC 3 0 (SUTURE) IMPLANT
SUT VIC AB 4-0 P-3 18XBRD (SUTURE) IMPLANT
SUT VIC AB 4-0 P3 18 (SUTURE)
SYR CONTROL 10ML LL (SYRINGE) ×3 IMPLANT
TOWEL OR 17X24 6PK STRL BLUE (TOWEL DISPOSABLE) ×3 IMPLANT
TRAY DSU PREP LF (CUSTOM PROCEDURE TRAY) ×3 IMPLANT
TUBE CONNECTING 12'X1/4 (SUCTIONS) ×1
TUBE CONNECTING 12X1/4 (SUCTIONS) ×2 IMPLANT
YANKAUER SUCT BULB TIP NO VENT (SUCTIONS) ×3 IMPLANT

## 2017-08-01 NOTE — Anesthesia Postprocedure Evaluation (Signed)
Anesthesia Post Note  Patient: Juan Harrison  Procedure(s) Performed: Procedure(s) (LRB): ANAL EXAM UNDER ANESTHESIA WITH BIOPSY (N/A)     Patient location during evaluation: PACU Anesthesia Type: MAC Level of consciousness: awake and alert Pain management: pain level controlled Vital Signs Assessment: post-procedure vital signs reviewed and stable Respiratory status: spontaneous breathing, nonlabored ventilation, respiratory function stable and patient connected to nasal cannula oxygen Cardiovascular status: stable and blood pressure returned to baseline Anesthetic complications: no    Last Vitals:  Vitals:   08/01/17 1100 08/01/17 1115  BP: 95/67 116/71  Pulse: (!) 49 (!) 55  Resp: 13 17  Temp:    SpO2: 100% 100%    Last Pain:  Vitals:   08/01/17 0859  TempSrc: Oral                 Ryan P Ellender

## 2017-08-01 NOTE — Interval H&P Note (Signed)
History and Physical Interval Note:  08/01/2017 8:53 AM  Juan Harrison  has presented today for surgery, with the diagnosis of anal mass  The various methods of treatment have been discussed with the patient and family. After consideration of risks, benefits and other options for treatment, the patient has consented to  Procedure(s): ANAL EXAM UNDER ANESTHESIA WITH BIOPSY (N/A) as a surgical intervention .  The patient's history has been reviewed, patient examined, no change in status, stable for surgery.  I have reviewed the patient's chart and labs.  Questions were answered to the patient's satisfaction.     Rosario Adie, MD  [facility]

## 2017-08-01 NOTE — Anesthesia Procedure Notes (Signed)
Procedure Name: MAC Date/Time: 08/01/2017 9:51 AM Performed by: Bethena Roys T Pre-anesthesia Checklist: Patient identified, Timeout performed, Emergency Drugs available, Suction available and Patient being monitored Oxygen Delivery Method: Nasal cannula Placement Confirmation: positive ETCO2

## 2017-08-01 NOTE — Anesthesia Preprocedure Evaluation (Addendum)
Anesthesia Evaluation  Patient identified by MRN, date of birth, ID band Patient awake    Reviewed: Allergy & Precautions, NPO status , Patient's Chart, lab work & pertinent test results  Airway Mallampati: I  TM Distance: >3 FB Neck ROM: Full    Dental  (+) Partial Upper   Pulmonary Current Smoker,    Pulmonary exam normal breath sounds clear to auscultation       Cardiovascular negative cardio ROS Normal cardiovascular exam Rhythm:Regular Rate:Normal     Neuro/Psych negative neurological ROS  negative psych ROS   GI/Hepatic negative GI ROS, (+)     substance abuse  , Hepatitis -, B  Endo/Other  negative endocrine ROS  Renal/GU negative Renal ROS     Musculoskeletal   Abdominal   Peds  Hematology  (+) HIV,   Anesthesia Other Findings Anal CA s/p chemo and radiation  Reproductive/Obstetrics                            Anesthesia Physical  Anesthesia Plan  ASA: III  Anesthesia Plan: MAC   Post-op Pain Management:    Induction: Intravenous  PONV Risk Score and Plan: 0 and Propofol infusion and Treatment may vary due to age or medical condition  Airway Management Planned: Natural Airway  Additional Equipment:   Intra-op Plan:   Post-operative Plan:   Informed Consent: I have reviewed the patients History and Physical, chart, labs and discussed the procedure including the risks, benefits and alternatives for the proposed anesthesia with the patient or authorized representative who has indicated his/her understanding and acceptance.   Dental advisory given  Plan Discussed with: CRNA  Anesthesia Plan Comments:         Anesthesia Quick Evaluation

## 2017-08-01 NOTE — Discharge Instructions (Addendum)

## 2017-08-01 NOTE — Transfer of Care (Signed)
Immediate Anesthesia Transfer of Care Note  Patient: Juan Harrison  Procedure(s) Performed: Procedure(s): ANAL EXAM UNDER ANESTHESIA WITH BIOPSY (N/A)  Patient Location: PACU  Anesthesia Type:MAC  Level of Consciousness: drowsy  Airway & Oxygen Therapy: o2 per [place]  Post-op Assessment: Report given to RN  Post vital signs: Reviewed and stable  Last Vitals: 107/51, 57, 14, 100% Vitals:   08/01/17 0859  BP: 100/64  Pulse: 66  Resp: 18  Temp: 36.4 C  SpO2: 100%    Last Pain:  Vitals:   08/01/17 0859  TempSrc: Oral      Patients Stated Pain Goal: 7 (27/06/23 7628)  Complications: No apparent anesthesia complications

## 2017-08-01 NOTE — Op Note (Addendum)
08/01/2017  9:53 AM  PATIENT:  Juan Harrison  46 y.o. male  Patient Care Team: Patient, No Pcp Per as PCP - General (General Practice) Campbell Riches, MD as Consulting Physician (Infectious Diseases) Leighton Ruff, MD as Consulting Physician (General Surgery) Kyung Rudd, MD as Consulting Physician (Radiation Oncology)  PRE-OPERATIVE DIAGNOSIS:  anal mass, h/o anal cancer   POST-OPERATIVE DIAGNOSIS:  anal ulcer, h/o anal cancer  PROCEDURE:   ANAL EXAM UNDER ANESTHESIA WITH BIOPSY   Surgeon(s): Leighton Ruff, MD  ASSISTANT: none   ANESTHESIA:   local and MAC  SPECIMEN:  Source of Specimen:  anal canal mass  DISPOSITION OF SPECIMEN:  PATHOLOGY  COUNTS:  YES  PLAN OF CARE: Discharge to home after PACU  PATIENT DISPOSITION:  PACU - hemodynamically stable.  INDICATION: 46 y.o. M with HIV and a h/o anal cancer, 6 months s/p completion of treatment.  He has a persistent anal mass that is tender to palpation. I am unable to get a clear exam in the office due to pain. I recommended an exam under anesthesia and biopsy of this mass to evaluate for recurrence.   OR FINDINGS: posterior midline anal ulcer  DESCRIPTION: the patient was identified in the preoperative holding area and taken to the OR where they were laid on the operating room table.  MAC anesthesia was induced without difficulty. The patient was then positioned in prone jackknife position with buttocks gently taped apart.  The patient was then prepped and draped in usual sterile fashion.  SCDs were noted to be in place prior to the initiation of anesthesia. A surgical timeout was performed indicating the correct patient, procedure, positioning and need for preoperative antibiotics.  A rectal block was performed using Marcaine with epinephrine.    I began with a digital rectal exam.  The mass could be palpated at posterior midline.  I then placed a Hill-Ferguson anoscope into the anal canal and evaluated this  completely.  There was an obvious anal ulceration at midline. There was no signs of recurrence. The edges of the ulcer were resected with Metzenbaum scissors and taken for biopsy. The ulcer was then closed using a 2-0 and 3-0 chromic suture.  He tolerated this well and sent to the postanesthesia care unit in stable condition. All counts were correct per operating room staff.     I have reviewed the [facility] and see no other narcotics prescriptions listed.

## 2017-08-01 NOTE — H&P (View-Only) (Signed)
History of Present Illness Juan Ruff MD; 9/56/2130 10:35 AM) The patient is a 46 year old male who presents with anal pain. 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 The patient has AIDS with a CD4 count at 110. HIV viral loads or undetectable though. H presented to the office c/o severe anal pain, occasional bleeding. He appears to be somewhat non-compliant with his medications. He underwent anal EUA and bx and was noted to have a stage IIIa anal cancer and underwent treatment for this with chemotherapy and radiation in January and February 2018. He had some skin discomfort with radiation treatment but otherwise tolerated this well. He is now having bowel movements without difficulty. He denies any rectal bleeding.   Problem List/Past Medical Juan Ruff, MD; 8/65/7846 10:35 AM) ANAL OR RECTAL PAIN (K62.89)  CANCER OF ANAL CANAL (C21.1)   Past Surgical History Juan Ruff, MD; 9/62/9528 10:35 AM) Foot Surgery  Right. Oral Surgery   Allergies ([name], [place]; 07/14/2017 10:27 AM) No Known Drug Allergies 11/14/2016 Allergies Reconciled   Medication History ([name], [place]; 07/14/2017 10:27 AM) [name] (150-150-200-10MG  Tablet, Oral two times daily) Active. ValACYclovir HCl (1GM Tablet, Oral daily) Active. Medications Reconciled  Social History Juan Ruff, MD; 03/15/2439 10:35 AM) Alcohol use  Recently quit alcohol use. Caffeine use  Coffee. Illicit drug use  Uses socially only. Tobacco use  Current every day smoker.  Family History Juan Ruff, MD; 12/04/7251 10:35 AM) Cancer  Family Members In General. Respiratory Condition  Brother.  Other Problems Juan Ruff, MD; 6/64/4034 10:35 AM) Anxiety Disorder  Bladder Problems  Hemorrhoids  HIV-positive     Review of Systems Juan Ruff MD; 7/42/5956 10:35 AM) General Present- Fatigue. Not Present- Appetite Loss, Chills,  Fever, Night Sweats, Weight Gain and Weight Loss. Skin Present- Change in Wart/Mole. Not Present- Dryness, Hives, Jaundice, New Lesions, Non-Healing Wounds, Rash and Ulcer. HEENT Not Present- Earache, Hearing Loss, Hoarseness, Nose Bleed, Oral Ulcers, Ringing in the Ears, Seasonal Allergies, Sinus Pain, Sore Throat, Visual Disturbances, Wears glasses/contact lenses and Yellow Eyes. Respiratory Not Present- Bloody sputum, Chronic Cough, Difficulty Breathing, Snoring and Wheezing. Cardiovascular Not Present- Chest Pain, Difficulty Breathing Lying Down, Leg Cramps, Palpitations, Rapid Heart Rate, Shortness of Breath and Swelling of Extremities. Gastrointestinal Present- Bloody Stool, Change in Bowel Habits, Chronic diarrhea, Constipation, Excessive gas, Hemorrhoids and Rectal Pain. Not Present- Abdominal Pain, Bloating, Difficulty Swallowing, Gets full quickly at meals, Indigestion, Nausea and Vomiting. Male Genitourinary Present- Painful Urination. Not Present- Blood in Urine, Change in Urinary Stream, Frequency, Impotence, Nocturia, Urgency and Urine Leakage. Neurological Present- Tingling and Trouble walking. Not Present- Decreased Memory, Fainting, Headaches, Numbness, Seizures, Tremor and Weakness. Psychiatric Present- Anxiety and Change in Sleep Pattern. Not Present- Bipolar, Depression, Fearful and Frequent crying. Hematology Present- HIV. Not Present- Blood Thinners, Easy Bruising, Excessive bleeding, Gland problems and Persistent Infections.  Vitals ([name]; 07/14/2017 10:27 AM) 07/14/2017 10:27 AM Weight: 141.2 lb Height: 75in Body Surface Area: 1.89 m Body Mass Index: 17.65 kg/m  Temp.: 97.40F  Pulse: 93 (Regular)  P.OX: 99% (Room air) BP: 116/78 (Sitting, Left Arm, Standard)       Physical Exam Juan Ruff MD; 3/87/5643 10:36 AM) General Mental Status-Alert. General Appearance-Not in acute distress. Build & Nutrition-Well  nourished. Posture-Normal posture. Gait-Normal.  Head and Neck Head-normocephalic, atraumatic with no lesions or palpable masses. Trachea-midline.  Chest and Lung Exam Chest and lung exam reveals -on auscultation, normal breath sounds,  no adventitious sounds and normal vocal resonance.  Cardiovascular Cardiovascular examination reveals -normal heart sounds, regular rate and rhythm with no murmurs and no digital clubbing, cyanosis, edema, increased warmth or tenderness.  Abdomen Inspection Inspection of the abdomen reveals - No Hernias. Palpation/Percussion Palpation and Percussion of the abdomen reveal - Soft, Non Tender, No Rigidity (guarding), No hepatosplenomegaly and No Palpable abdominal masses.  Rectal Anorectal Exam External - Note: min scarring noted. Internal - Note: Anal nodule at posterior midline. Significant tenderness to palpation circumferentially. No other obvious masses noted.  Neurologic Neurologic evaluation reveals -alert and oriented x 3 with no impairment of recent or remote memory, normal attention span and ability to concentrate, normal sensation and normal coordination.  Musculoskeletal Normal Exam - Bilateral-Upper Extremity Strength Normal and Lower Extremity Strength Normal.    Assessment & Plan Juan Ruff MD; 01/01/4387 10:37 AM) CANCER OF ANAL CANAL (C21.1) Impression: 46 year old male with HIV who presents to the office for follow-up after radiation therapy approximately 6 months ago for anal cancer. On exam today he continues to have a painful posterior midline anal nodule. I'm concerned that he has recurrent cancer in the area. I recommended an anal exam under anesthesia with biopsy.

## 2017-08-05 ENCOUNTER — Encounter (HOSPITAL_BASED_OUTPATIENT_CLINIC_OR_DEPARTMENT_OTHER): Payer: Self-pay | Admitting: General Surgery

## 2017-08-05 LAB — POCT HEMOGLOBIN-HEMACUE: Hemoglobin: 13.2 g/dL (ref 13.0–17.0)

## 2017-08-05 MED FILL — GENVOYA TABLET: 150-150-200 | 30 days supply | Qty: 30 | Fill #1

## 2017-08-27 ENCOUNTER — Encounter: Payer: Self-pay | Admitting: Pharmacist Clinician (PhC)/ Clinical Pharmacy Specialist

## 2017-09-01 ENCOUNTER — Other Ambulatory Visit: Payer: Self-pay | Admitting: Pharmacist

## 2017-09-01 ENCOUNTER — Other Ambulatory Visit: Payer: Medicare Other

## 2017-09-01 ENCOUNTER — Ambulatory Visit: Payer: Medicare Other | Admitting: Hematology

## 2017-09-03 ENCOUNTER — Ambulatory Visit: Payer: Medicare Other | Admitting: Infectious Diseases

## 2017-09-04 ENCOUNTER — Encounter: Payer: Self-pay | Admitting: Hematology

## 2017-09-04 ENCOUNTER — Ambulatory Visit (HOSPITAL_BASED_OUTPATIENT_CLINIC_OR_DEPARTMENT_OTHER): Payer: Medicare Other | Admitting: Hematology

## 2017-09-04 ENCOUNTER — Telehealth: Payer: Self-pay | Admitting: Hematology

## 2017-09-04 ENCOUNTER — Other Ambulatory Visit (HOSPITAL_BASED_OUTPATIENT_CLINIC_OR_DEPARTMENT_OTHER): Payer: Medicare Other

## 2017-09-04 ENCOUNTER — Ambulatory Visit: Payer: Medicare Other

## 2017-09-04 VITALS — BP 94/70 | HR 90 | Temp 97.7°F | Resp 20 | Ht 75.0 in | Wt 139.7 lb

## 2017-09-04 DIAGNOSIS — I959 Hypotension, unspecified: Secondary | ICD-10-CM

## 2017-09-04 DIAGNOSIS — C210 Malignant neoplasm of anus, unspecified: Secondary | ICD-10-CM | POA: Diagnosis not present

## 2017-09-04 DIAGNOSIS — B20 Human immunodeficiency virus [HIV] disease: Secondary | ICD-10-CM | POA: Diagnosis not present

## 2017-09-04 LAB — CBC WITH DIFFERENTIAL/PLATELET
BASO%: 0.3 % (ref 0.0–2.0)
BASOS ABS: 0 10*3/uL (ref 0.0–0.1)
EOS%: 7.4 % — AB (ref 0.0–7.0)
Eosinophils Absolute: 0.3 10*3/uL (ref 0.0–0.5)
HEMATOCRIT: 40.2 % (ref 38.4–49.9)
HGB: 13.4 g/dL (ref 13.0–17.1)
LYMPH%: 30.1 % (ref 14.0–49.0)
MCH: 29.6 pg (ref 27.2–33.4)
MCHC: 33.3 g/dL (ref 32.0–36.0)
MCV: 88.7 fL (ref 79.3–98.0)
MONO#: 0.4 10*3/uL (ref 0.1–0.9)
MONO%: 11.7 % (ref 0.0–14.0)
NEUT#: 1.9 10*3/uL (ref 1.5–6.5)
NEUT%: 50.5 % (ref 39.0–75.0)
Platelets: 238 10*3/uL (ref 140–400)
RBC: 4.53 10*6/uL (ref 4.20–5.82)
RDW: 14.4 % (ref 11.0–14.6)
WBC: 3.8 10*3/uL — ABNORMAL LOW (ref 4.0–10.3)
lymph#: 1.1 10*3/uL (ref 0.9–3.3)

## 2017-09-04 LAB — COMPREHENSIVE METABOLIC PANEL
ALT: 25 U/L (ref 0–55)
AST: 17 U/L (ref 5–34)
Albumin: 3.7 g/dL (ref 3.5–5.0)
Alkaline Phosphatase: 82 U/L (ref 40–150)
Anion Gap: 7 mEq/L (ref 3–11)
BUN: 14 mg/dL (ref 7.0–26.0)
CALCIUM: 9.6 mg/dL (ref 8.4–10.4)
CHLORIDE: 106 meq/L (ref 98–109)
CO2: 27 meq/L (ref 22–29)
Creatinine: 1.3 mg/dL (ref 0.7–1.3)
EGFR: 77 mL/min/{1.73_m2} — ABNORMAL LOW (ref >90)
Glucose: 114 mg/dl (ref 70–140)
POTASSIUM: 3.9 meq/L (ref 3.5–5.1)
SODIUM: 140 meq/L (ref 136–145)
Total Bilirubin: 0.33 mg/dL (ref 0.20–1.20)
Total Protein: 7.5 g/dL (ref 6.4–8.3)

## 2017-09-04 NOTE — Telephone Encounter (Signed)
Gave avs and calendar for October and february

## 2017-09-04 NOTE — Progress Notes (Addendum)
[place]  [phone] [phone]  Clinic follow up Note   Patient Care Team: Patient, No Pcp Per as PCP - General (General Practice) Juan Harrison Juan Bradford, MD as Consulting Physician (Infectious Diseases) Juan Ruff, MD as Consulting Physician (General Surgery) Juan Rudd, MD as Consulting Physician (Radiation Oncology) 09/04/2017   CHIEF COMPLAINTS:  Follow up anal cancer  Oncology History   Anal cancer ([place])   Staging form: Anus, AJCC 8th Edition   - Clinical: Stage IIIA (cT2, cN1a, cM0) - Signed by Juan Merle, MD on 12/10/2016      Anal cancer ([place])   11/29/2016 Initial Biopsy    Anal canal mass biopsy showed 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 Marcello Harrison in OR:"There was an obvious anal mass noted in the left posterior lateral anal canal.  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   I then placed a Hill-Ferguson anoscope into the anal canal and evaluated this completely.  The mass was noted with an area of central necrosis invading into the sphincter complex.  The edges of the ulcer were trimmed away using scissors.  This was sent to pathology for further examination       12/06/2016 Imaging    CT chest, abdomen and pelvis with contrast showed apparent wall thickening in the distal rectum and anus, this is associated with 2 posterior left pelvic sidewall nodes, highly suspicious for metastatic disease. 10 mm right upper lobe pulmonary nodule has essential stippled calcification and associated calcified nodule tissue in the right hilum, probably granuloma. No other distant metastasis.       12/10/2016 Initial Diagnosis    Anal cancer ([place])     12/13/2016 Imaging    NM PET Image Initial (pi) Skull Base to Thigh  IMPRESSION: 1. Intensely hypermetabolic 4.0 x 3.3 x 6.3 cm anal  mass, compatible with known primary anal carcinoma. 2. Hypermetabolic bilateral internal iliac nodal metastases. 3. Hypermetabolic left inguinal nodal metastasis. 4. Solitary mildly hypermetabolic nonenlarged left para-aortic lymph node, suspicious for nodal metastasis. 5. No additional definite sites of hypermetabolic metastatic disease. 6. Nonspecific nonenlarged mildly hypermetabolic bilateral upper neck lymph nodes, statistically most likely to represent benign reactive nodes. 7. Mild paraseptal emphysema       12/16/2016 - 01/27/2017 Radiation Therapy    Definitive radiation to his anal cancer, 55 Gy in 30 fractions       12/16/2016 - 01/24/2017 Chemotherapy    Concurrent chemo Mitomycin 10mg /m2 on day 1 and 35 and 5-fu 1000mg /m2/day on day 1-5 and 35-39 (second cycle postponed due to scheduling issue), pt tolerated well         05/01/2017 Imaging    PET  IMPRESSION: 1. No convincing findings of residual anal neoplasm. No residual discrete anal mass on the CT images. Vague mild anal hypermetabolism (max SUV 3.9), markedly decreased, with mild circumferential anal wall thickening, favored to represent post treatment change. 2. No convincing findings of residual metastatic disease. Solitary mildly hypermetabolic nonenlarged left level 2 neck lymph node is decreased in size and metabolism and is statistically more likely to represent a benign reactive node. No residual hypermetabolism within the previously visualized left para-aortic, left inguinal or internal iliac lymph nodes.      08/01/2017 Surgery    ANAL EXAM UNDER ANESTHESIA WITH BIOPSY by Dr. Marcello Harrison 08/01/17  08/01/2017 Pathology Results    Diagnosis 08/01/17 Anus, biopsy, of cancer scar - SQUAMOUS MUCOSA WITH CONDYLOMATOUS CHANGE (AIN-I, MILD DYSPLASIA). - BENIGN COLORECTAL MUCOSA. - NO MALIGNANCY IDENTIFIED.       HISTORY OF PRESENTING ILLNESS (12/11/2015):  Juan Harrison 46 y.o. male with past medical history of  HIV, is here because of His recent diagnosed anal cancer. He is accompanied by his sister to the clinic today.  He has had bloody BM and rectal pain for one month, his rectal bleeding is mild, mixed with stool, no blood clots. He rates his rectal pain at 7-8/10, especially with bowel movement. he takes oxycodone 1-2/day.   Patient was seen by colorectal surgeon Dr. Marcello Harrison, could not tolerate rectal exam and anoscope in office, was brought to the OR for the above exam, which showed " There was an obvious anal mass noted in the left posterior lateral anal canal.  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   I then placed a Hill-Ferguson anoscope into the anal canal and evaluated this completely.  The mass was noted with an area of central necrosis invading into the sphincter complex.  The edges of the ulcer were trimmed away using scissors.  This was sent to pathology for further examination". The biopsy showed squamous cell carcinoma.  He was found to be HIV positive in 2007, has been seen Per ID Dr. Johnnye Harrison, not very compliant. He previously on  DRVr/TRV/EPZ bactrim, changed to genvoya 05-2016.  His last CD4 was 110 on 10/29/2016.   He is single, lives alone in a two story townhouse. He does not work, on [employer]. He has a sister Juan Harrison who lives in [place]. He has some friends, only able to help if he needs.  CURRENT THERAPY: Surveillance.   INTERIM HISTORY:  Juan Harrison returns for follow up for his anal cancer as scheduled. He was last seen here 4 months ago and has been doing well in the interim. He has a slight "chest cold" with a dry cough, no nasal congestion, chest pain or dyspnea. He has mild fatigue that continues to improve and his appetite is "up to 75%." He has occasional loose bowels, denies pain with BM's, blood in his stool, nausea, vomiting, or constipation. He has intermittent lightheadedness that he attributes to not eating  until he becomes symptomatic.   MEDICAL HISTORY:  Past Medical History:  Diagnosis Date  . AIDS [facility]) infectious disease--- dr hatcher   dx 2007,  usCD4,  count at 110  . Anal cancer [facility]) oncologist-  dr feng/  dr moody   intial dx 12-10-2016--  Invasive Squamous Cell carcinoma in situ, Stage IIIA ([id number]) --- completed concurrent radiation and chemotherapy (rxt 01-27-2017 chemo 01-24-2017)    . Anal condyloma   . Hepatitis B surface antigen positive   . History of cancer chemotherapy 12-16-2016 to 01-24-2017  . History of radiation therapy 12-16-2016 to 01-27-2017   55Gy in 30 sessions  . Pulmonary nodule, right    per Chest CT 12-06-2008  . Rectal mass    anal    SURGICAL HISTORY: Past Surgical History:  Procedure Laterality Date  . IR GENERIC HISTORICAL  12/16/2016   IR US GUIDE VASC ACCESS RIGHT 12/16/2016 Arne Cleveland, MD [facility]-INTERV RAD  . IR GENERIC HISTORICAL  12/16/2016   IR FLUORO GUIDE CV LINE RIGHT 12/16/2016 Arne Cleveland, MD [facility]-INTERV RAD  . RECTAL BIOPSY N/A 11/29/2016   Procedure: BIOPSY ANAL CANAL MASS;  Surgeon: Juan Ruff, MD;  Location: [facility];  Service: General;  Laterality: N/A;  . RECTAL EXAM UNDER ANESTHESIA N/A 11/29/2016   Procedure: EXAM UNDER ANESTHESIA;  Surgeon: Juan Ruff, MD;  Location: [facility];  Service: General;  Laterality: N/A;  . RECTAL EXAM UNDER ANESTHESIA N/A 08/01/2017   Procedure: ANAL EXAM UNDER ANESTHESIA WITH BIOPSY;  Surgeon: Juan Ruff, MD;  Location: [facility];  Service: General;  Laterality: N/A;  . 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 Marital status: Single    Spouse name: N/A  . Number of children: N/A  . Years of education: N/A   Occupational History  . Not on file.   Social History Main Topics  . Smoking status: Current Every Day Smoker    Packs/day: 0.25    Years: 20.00    Types:  Cigarettes    Last attempt to quit: 10/13/2016  . Smokeless tobacco: Never Used     Comment: 2-3 cigarettes a day   . Alcohol use No     Comment: previously drank alcohol on weekends for 25 years, he quit on 10/14/2016   . Drug use: Yes    Types: Marijuana  . Sexual activity: Not on file   Other Topics Concern  . Not on file   Social History Narrative   Single, lives alone about 10 minutes from [place] in 2 story townhouse   Has one sister, Juan Harrison who lives in [place] in past via friends   On disability    FAMILY HISTORY: Family History  Problem Relation Age of Onset  . COPD Mother   . Asthma Brother     ALLERGIES:  has No Known Allergies.  MEDICATIONS:  Current Outpatient Prescriptions  Medication Sig Dispense Refill  . GENVOYA 150-150-200-10 MG TABS tablet TAKE 1 TABLET BY MOUTH DAILY WITH BREAKFAST. 30 tablet 1   No current facility-administered medications for this visit.     REVIEW OF SYSTEMS:   Constitutional: Denies fevers, chills or abnormal night sweats (+) fatigue (+) decreased appetite  Eyes: Denies blurriness of vision, double vision or watery eyes Ears, nose, mouth, throat, and face: Denies mucositis or sore throat Respiratory: Denies  dyspnea or wheezes (+) dry cough Cardiovascular: Denies palpitation, chest discomfort or lower extremity swelling Gastrointestinal:  Denies nausea, vomiting, constipation, diarrhea, heartburn or change in bowel habits (+) intermittent loose stool, denies blood Skin: Denies abnormal skin rashes. (+) Darkened nails Lymphatics: Denies new lymphadenopathy or easy bruising Neurological:Denies numbness, tingling or new weaknesses Behavioral/Psych: Mood is stable, no new changes  All other systems were reviewed with the patient and are negative.  PHYSICAL EXAMINATION: ECOG PERFORMANCE STATUS: 1 - Symptomatic but completely ambulatory  Vitals:   09/04/17 1544  BP: 94/70  Pulse: 90  Resp: 20  Temp: 97.7  F (36.5 C)  SpO2: 100%   Filed Weights   09/04/17 1544  Weight: 139 lb 11.2 oz (63.4 kg)     GENERAL:alert, no distress and comfortable SKIN: skin color, texture, turgor are normal, no rashes or significant lesions  EYES: normal, conjunctiva are pink and non-injected, sclera clear OROPHARYNX:no exudate, no erythema and lips, buccal mucosa, and tongue normal  NECK: supple, thyroid normal size, non-tender, without nodularity LYMPH:  no palpable lymphadenopathy in the cervical, axillary or inguinal LUNGS: clear to auscultation bilaterally with normal breathing effort HEART: regular rate &  rhythm, S1 and S2 present, no murmurs and no lower extremity edema ABDOMEN:abdomen soft, flat, non-tender and normal bowel sounds, no palpable hepatomegaly or masses Rectal :inspection showed no mass at anus, no palpable mass on the rectal exam, he had a moderate pain with the rectal exam Musculoskeletal:no cyanosis of digits and no clubbing. Hyperpigmentation to nails PSYCH: alert & oriented x 3 with fluent speech NEURO: no focal motor/sensory deficits  LABORATORY DATA:  I have reviewed the data as listed CBC Latest Ref Rng & Units 09/04/2017 08/01/2017 05/02/2017  WBC 4.0 - 10.3 10e3/uL 3.8(L) - 4.6  Hemoglobin 13.0 - 17.1 g/dL 13.4 13.2 12.9(L)  Hematocrit 38.4 - 49.9 % 40.2 - 39.5  Platelets 140 - 400 10e3/uL 238 - 263   CMP Latest Ref Rng & Units 09/04/2017 05/02/2017 02/10/2017  Glucose 70 - 140 mg/dl 114 116 98  BUN 7.0 - 26.0 mg/dL 14.0 8.3 10.0  Creatinine 0.7 - 1.3 mg/dL 1.3 1.2 1.0  Sodium 136 - 145 mEq/L 140 144 142  Potassium 3.5 - 5.1 mEq/L 3.9 3.6 3.1(L)  Chloride 98 - 110 mmol/L - - -  CO2 22 - 29 mEq/L 27 28 29   Calcium 8.4 - 10.4 mg/dL 9.6 9.0 9.0  Total Protein 6.4 - 8.3 g/dL 7.5 6.9 7.4  Total Bilirubin 0.20 - 1.20 mg/dL 0.33 0.42 0.31  Alkaline Phos 40 - 150 U/L 82 86 79  AST 5 - 34 U/L 17 18 23   ALT 0 - 55 U/L 25 17 31    PATHOLOGY REPORT   Diagnosis 08/01/17 Anus, biopsy,  of cancer scar - SQUAMOUS MUCOSA WITH CONDYLOMATOUS CHANGE (AIN-I, MILD DYSPLASIA). - BENIGN COLORECTAL MUCOSA. - NO MALIGNANCY IDENTIFIED.   Diagnosis 11/29/2016 Anus, biopsy, anal canal mass - INVASIVE MODERATELY DIFFERENTIATED SQUAMOUS CELL CARCINOMA ASSOCIATED WITH SQUAMOUS CELL CARCINOMA IN SITU.  RADIOGRAPHIC STUDIES: I have personally reviewed the radiological images as listed and agreed with the findings in the report.  PET 5/31/208 IMPRESSION: 1. No convincing findings of residual anal neoplasm. No residual discrete anal mass on the CT images. Vague mild anal hypermetabolism (max SUV 3.9), markedly decreased, with mild circumferential anal wall thickening, favored to represent post treatment change. 2. No convincing findings of residual metastatic disease. Solitary mildly hypermetabolic nonenlarged left level 2 neck lymph node is decreased in size and metabolism and is statistically more likely to represent a benign reactive node. No residual hypermetabolism within the previously visualized left para-aortic, left inguinal or internal iliac lymph nodes.  PET 12/13/2016 IMPRESSION: 1. Intensely hypermetabolic 4.0 x 3.3 x 6.3 cm anal mass, compatible with known primary anal carcinoma. 2. Hypermetabolic bilateral internal iliac nodal metastases. 3. Hypermetabolic left inguinal nodal metastasis. 4. Solitary mildly hypermetabolic nonenlarged left para-aortic lymph node, suspicious for nodal metastasis. 5. No additional definite sites of hypermetabolic metastatic disease. 6. Nonspecific nonenlarged mildly hypermetabolic bilateral upper neck lymph nodes, statistically most likely to represent benign reactive nodes. 7. Mild paraseptal emphysema .  ASSESSMENT & PLAN:  46 y.o. African-American male, with history of HIV and AIDS, on genvoya since 03/2016,presents with rectal bleeding and pain.  1.  Anal cancer, invasive squamous cell carcinoma,cT2N1aM0, stage IIIA -He had  locally advanced anal cancer and received a standard of care with concurrent chemotherapy and radiation.  -His primary oncologist previously discussed that his anal cancer will likely be cured by concurrent chemotherapy and radiation, although his HIV and low CD4 count may predicts high risk for chemoradiation, medications, and poor prognosis for his anal cancer. -he completed  the standard radiation and chemotherapy regimen with mitomycin and 5-FU infusion from 12/16/2016 to 01/27/2017, received full dose for 2 cycles, second cycle was postponed for one week due to scheduling issues. -He overall tolerated chemotherapy well without severe cytopenias -Surveillance PET scan from 05/01/2017 showed a complete metabolic response to treatment,  -due to his previous hypermetabolic retroperitoneal lymph nodes, he is at very high risk for recurrence. I will repeat a PET scan in 04/2018 -Dr. Marcello Harrison palpated an anal mass in August 2018, the patient experienced severe pain during rectal exam; she performed anal exam under anesthesia in the OR on 08/01/2017, biopsy was negative for malignancy. He had completed response to chemo and RT -Labs reviewed, overall unremarkable, physical exam was unremarkable, no palpable mass on rectal exam, no clinical concern for recurrence -He will continue close follow-up with his multidisciplinary team -He'll follow-up with Dr. Marcello Harrison this month and will return to see [place] in 4 months  2. Rectal bleeding and pain  -Secondary to his anal cancer and radiation dermatitis.  -Completely resolved after treatment. He is off narcotics.  3. HIV/AIDS -His treatment was switched to genvoya in 05/2016, his CD4 has improved since then, but overall still low -continue HIV therapy when on chemo, will check with Dr. Johnnye Harrison    4. Hypotension, nutrition -He become symptomatic with lightheadedness when he misses a meal -He has been previously encouraged to eat more to gain weight -I encouraged  him to dangle in the sitting position before getting out of bed and to eat regular meals  -BP 94/70 today, I have encouraged him to increased liquid intake -I recommend he see Ernestene Kiel, dietitian for ongoing nutritional support, he agrees  Plan -He is refusing a flu shot today, I encouraged him to get one at his PCP or local pharmacy -Labs and physical exam were unremarkable, he is doing well clinically -Scheduled with Ernestene Kiel, dietitian for ongoing nutritional support -The patient prefers to see Dr. Marcello Harrison this month, he will reschedule with her; I will send a message to her that I have seen the patient -Surveillance PET scan in 04/2018 -Lab and follow-up in 4 months  All questions were answered. The patient knows to call the clinic with any problems, questions or concerns.  I spent 20 minutes counseling the patient face to face. The total time spent in the appointment was 25 minutes and more than 50% was on counseling.  Cira Rue, AGNP-C 09/04/2017  I have seen the patient, examined him. I agree with the assessment and and plan and have edited the notes.   Juan Harrison  09/04/2017

## 2017-09-12 ENCOUNTER — Encounter: Payer: Self-pay | Admitting: Nutrition

## 2017-09-12 ENCOUNTER — Encounter: Payer: Medicare Other | Admitting: Nutrition

## 2017-09-12 NOTE — Progress Notes (Signed)
I contacted patient to reschedule nutrition appointment. Patient declined nutrition follow-up but requested chocolate boost plus samples. Patient will pick these up today.  He has my contact information if needed.  **Disclaimer: This note was dictated with voice recognition software. Similar sounding words can inadvertently be transcribed and this note may contain transcription errors which may not have been corrected upon publication of note.**

## 2017-09-24 ENCOUNTER — Other Ambulatory Visit: Payer: Self-pay | Admitting: Pharmacist

## 2017-10-06 ENCOUNTER — Other Ambulatory Visit: Payer: Self-pay | Admitting: Infectious Diseases

## 2017-10-06 ENCOUNTER — Ambulatory Visit (INDEPENDENT_AMBULATORY_CARE_PROVIDER_SITE_OTHER): Payer: Medicare Other | Admitting: Pharmacist

## 2017-10-06 DIAGNOSIS — A498 Other bacterial infections of unspecified site: Secondary | ICD-10-CM | POA: Diagnosis not present

## 2017-10-06 DIAGNOSIS — R7881 Bacteremia: Secondary | ICD-10-CM | POA: Diagnosis not present

## 2017-10-06 DIAGNOSIS — B20 Human immunodeficiency virus [HIV] disease: Secondary | ICD-10-CM

## 2017-10-06 DIAGNOSIS — M01X0 Direct infection of unspecified joint in infectious and parasitic diseases classified elsewhere: Secondary | ICD-10-CM | POA: Diagnosis not present

## 2017-10-06 DIAGNOSIS — M009 Pyogenic arthritis, unspecified: Secondary | ICD-10-CM | POA: Diagnosis not present

## 2017-10-06 MED ORDER — ELVITEG-COBIC-EMTRICIT-TENOFAF 150-150-200-10 MG PO TABS: 1.0000 | ORAL_TABLET | Freq: Every day | ORAL | 5 refills | Status: DC

## 2017-10-06 NOTE — Progress Notes (Signed)
HPI: Juan Harrison is a 46 y.o. male who presents to the pharmacy clinic today for a follow-up on his HIV disease. He has not been seen at [facility] since 10/29/2016.  Allergies: No Known Allergies  Vitals:    Past Medical History: Past Medical History:  Diagnosis Date  . AIDS [facility]) infectious disease--- dr hatcher   dx 2007,  usCD4,  count at 110  . Anal cancer [facility]) oncologist-  dr feng/  dr moody   intial dx 12-10-2016--  Invasive Squamous Cell carcinoma in situ, Stage IIIA ([id number]) --- completed concurrent radiation and chemotherapy (rxt 01-27-2017 chemo 01-24-2017)    . Anal condyloma   . Hepatitis B surface antigen positive   . History of cancer chemotherapy 12-16-2016 to 01-24-2017  . History of radiation therapy 12-16-2016 to 01-27-2017   55Gy in 30 sessions  . Pulmonary nodule, right    per Chest CT 12-06-2008  . Rectal mass    anal    Social History: Social History   Socioeconomic History  . Marital status: Single    Spouse name: Not on file  . Number of children: Not on file  . Years of education: Not on file  . Highest education level: Not on file  Social Needs  . Financial resource strain: Not on file  . Food insecurity - worry: Not on file  . Food insecurity - inability: Not on file  . Transportation needs - medical: Not on file  . Transportation needs - non-medical: Not on file  Occupational History  . Not on file  Tobacco Use  . Smoking status: Current Every Day Smoker    Packs/day: 0.25    Years: 20.00    Pack years: 5.00    Types: Cigarettes    Last attempt to quit: 10/13/2016    Years since quitting: 0.9  . Smokeless tobacco: Never Used  . Tobacco comment: 2-3 cigarettes a day   Substance and Sexual Activity  . Alcohol use: No    Alcohol/week: 0.6 oz    Types: 1 Standard drinks or equivalent per week    Comment: previously drank alcohol on weekends for 25 years, he quit on 10/14/2016   . Drug use: Yes    Types: Marijuana  . Sexual  activity: Not on file  Other Topics Concern  . Not on file  Social History Narrative   Single, lives alone about 10 minutes from [place] in 2 story townhouse   Has one sister, [name] who lives in [place] in past via friends   On disability    Current Regimen: Genvoya  Labs: HIV 1 RNA Quant (copies/mL)  Date Value  10/29/2016 <20  07/22/2016 <20  02/29/2016 32,353 (H)   CD4 (no units)  Date Value  12/11/2016 See Separate Report   CD4 T Cell Abs (/uL)  Date Value  12/11/2016 180 (L)  10/29/2016 110 (L)  07/22/2016 60 (L)   Hep B S Ab (no units)  Date Value  02/29/2016 NEG   Hepatitis B Surface Ag (no units)  Date Value  02/29/2016 POSITIVE (A)   HCV Ab (no units)  Date Value  02/29/2016 NEGATIVE    CrCl: CrCl cannot be calculated (Patient's most recent lab result is older than the maximum 21 days allowed.).  Lipids:    Component Value Date/Time   CHOL 117 (L) 02/29/2016 1120   TRIG 69 02/29/2016 1120   HDL 39 (L) 02/29/2016 1120   CHOLHDL 3.0 02/29/2016 1120  VLDL 14 02/29/2016 1120   LDLCALC 64 02/29/2016 1120    Assessment: Juan Harrison reports to the clinic today for a follow-up on his HIV disease after not being seen for nearly a year. He reports that he has done well with his Genvoya. He did not note any side effects. He says that he has not missed any doses and takes his medication every morning with food. He had not yet taken his dose today because he hadn't eaten yet. He gets his Genvoya filled at the [facility] and has not had any difficulty getting it. He has about 2 weeks of medication left in his bottle today.  We asked Juan Harrison if he had noted any night sweats, chills or other signs of acute HIV recently. He said that he had cold-like symptoms a couple of weeks ago which resolved and he has otherwise been healthy. Juan Harrison had been receiving treatment for anal cancer but had his last chemo and radiation  therapy in February. He reports feeling much better now that he is off of the chemo.   Juan Harrison has had one recent sexual partner and says that he had not had "sex with anyone but a woman for 15 years'. We will check his G/C urine sample and RPR. He thinks he recently received a flu shot at [facility].   We will collect viral load and CD4 count today for his appointment with Dr. [name] next week.    Recommendations: - Viral load and CD4 count today - Urine G/C and RPR today  - F/U with Dr. [name] on 11/12 at 10:45 AM - Continue taking Genvoya every day with food   Susa Raring, PharmD PGY2 Infectious Diseases Pharmacy Resident [place] for Infectious Disease 10/06/2017, 10:29 AM

## 2017-10-07 LAB — T-HELPER CELL (CD4) - (RCID CLINIC ONLY)
CD4 T CELL ABS: 190 /uL — AB (ref 400–2700)
CD4 T CELL HELPER: 17 % — AB (ref 33–55)

## 2017-10-07 LAB — RPR: RPR: NONREACTIVE

## 2017-10-08 LAB — HIV-1 RNA QUANT-NO REFLEX-BLD
HIV 1 RNA QUANT: DETECTED {copies}/mL — AB
HIV-1 RNA Quant, Log: <1.3 Log copies/mL — AB

## 2017-10-13 ENCOUNTER — Encounter: Payer: Self-pay | Admitting: Infectious Diseases

## 2017-10-13 ENCOUNTER — Ambulatory Visit (INDEPENDENT_AMBULATORY_CARE_PROVIDER_SITE_OTHER): Payer: Medicare Other | Admitting: Infectious Diseases

## 2017-10-13 VITALS — BP 108/72 | HR 80 | Temp 98.2°F | Ht 75.0 in | Wt 151.0 lb

## 2017-10-13 DIAGNOSIS — Z720 Tobacco use: Secondary | ICD-10-CM | POA: Insufficient documentation

## 2017-10-13 DIAGNOSIS — Z113 Encounter for screening for infections with a predominantly sexual mode of transmission: Secondary | ICD-10-CM | POA: Diagnosis not present

## 2017-10-13 DIAGNOSIS — C210 Malignant neoplasm of anus, unspecified: Secondary | ICD-10-CM | POA: Diagnosis not present

## 2017-10-13 DIAGNOSIS — B20 Human immunodeficiency virus [HIV] disease: Secondary | ICD-10-CM | POA: Diagnosis present

## 2017-10-13 DIAGNOSIS — Z79899 Other long term (current) drug therapy: Secondary | ICD-10-CM | POA: Diagnosis not present

## 2017-10-13 DIAGNOSIS — Z23 Encounter for immunization: Secondary | ICD-10-CM | POA: Diagnosis not present

## 2017-10-13 MED ORDER — NICOTINE 7 MG/24HR TD PT24: 14.0000 mg | MEDICATED_PATCH | Freq: Every day | TRANSDERMAL | Status: DC

## 2017-10-13 MED ORDER — NICOTINE 7 MG/24HR TD PT24: 7.0000 mg | MEDICATED_PATCH | Freq: Every day | TRANSDERMAL | Status: DC

## 2017-10-13 NOTE — Assessment & Plan Note (Signed)
Appears to be doing well My great appreciation to [facility], Onc, Rad-Onc.

## 2017-10-13 NOTE — Assessment & Plan Note (Signed)
He is doing well Will continue him on genvoya alone.  Given condoms Given flu vax Will see him back in 6 months.

## 2017-10-13 NOTE — Progress Notes (Signed)
   Subjective:    Patient ID: Juan Harrison, male    DOB: 11-27-1971, 46 y.o.   MRN: [id number]  HPI 46 yo M with hx of HIV/AIDS. Dx ~2007. He is unclear how he got infected, was prev cared for in [place]. [facility] Darunavir-norvir/truvada/Epzicom bactrim, at transfer. Hx of non-compliance.  He was changed to genvoya 05-2016.  He has been seen by [facility]/Onc/Rad Onc for anal cancer.   No problems with ART.  States his anal cancer is clearing. Finished CTX and XRT 01-2017.   HIV 1 RNA Quant (copies/mL)  Date Value  10/06/2017 <20 DETECTED (A)  10/29/2016 <20  07/22/2016 <20   CD4 (no units)  Date Value  12/11/2016 See Separate Report   CD4 T Cell Abs (/uL)  Date Value  10/06/2017 190 (L)  12/11/2016 180 (L)  10/29/2016 110 (L)    Review of Systems  Constitutional: Negative for appetite change, chills, fever and unexpected weight change.  Respiratory: Negative for cough and shortness of breath.   Gastrointestinal: Negative for constipation and diarrhea.  Genitourinary: Negative for difficulty urinating.  Neurological: Negative for headaches.  Psychiatric/Behavioral: Negative for dysphoric mood.  Please see HPI. All other systems reviewed and negative.     Objective:   Physical Exam  Constitutional: He appears well-developed and well-nourished.  HENT:  Mouth/Throat: No oropharyngeal exudate.  Eyes: EOM are normal. Pupils are equal, round, and reactive to light.  Neck: Neck supple.  Cardiovascular: Normal rate, regular rhythm and normal heart sounds.  Pulmonary/Chest: Effort normal and breath sounds normal.  Abdominal: Soft. Bowel sounds are normal. There is no tenderness. There is no rebound.  Musculoskeletal: He exhibits no edema.  Lymphadenopathy:    He has no cervical adenopathy.  Psychiatric: He has a normal mood and affect.      Assessment & Plan:

## 2017-10-13 NOTE — Assessment & Plan Note (Signed)
Smoking < 1ppd Wants patch, will rx

## 2017-10-14 MED FILL — GENVOYA TABLET: 150-150-200 | 30 days supply | Qty: 30 | Fill #0

## 2017-11-05 ENCOUNTER — Other Ambulatory Visit: Payer: Self-pay | Admitting: Pharmacist

## 2017-11-17 ENCOUNTER — Other Ambulatory Visit: Payer: Self-pay | Admitting: Pharmacist

## 2017-11-17 MED ORDER — NICOTINE 14 MG/24HR TD PT24: 14.0000 mg | MEDICATED_PATCH | Freq: Every day | TRANSDERMAL | 0 refills | Status: DC

## 2017-11-17 MED ORDER — NICOTINE 7 MG/24HR TD PT24: 7.0000 mg | MEDICATED_PATCH | Freq: Every day | TRANSDERMAL | 0 refills | Status: DC

## 2017-11-17 MED FILL — NICOTINE 14 MG/24HR PATCH: 14 | 28 days supply | Qty: 28 | Fill #0

## 2017-12-19 ENCOUNTER — Telehealth: Payer: Self-pay | Admitting: Hematology

## 2017-12-19 NOTE — Telephone Encounter (Signed)
S/w pt advised appt chgd from 2/7 to 2/15 @ 11am. Pt aware.

## 2018-01-08 ENCOUNTER — Ambulatory Visit: Payer: Medicare Other | Admitting: Hematology

## 2018-01-08 ENCOUNTER — Other Ambulatory Visit: Payer: Medicare Other

## 2018-01-15 MED FILL — GENVOYA TABLET: 150-150-200 | 30 days supply | Qty: 30 | Fill #1

## 2018-01-15 NOTE — Progress Notes (Signed)
[facility]  Telephone:([phone]) [phone] Fax:(336) [phone]  Clinic follow up Note   Patient Care Team: Patient, No Pcp Per as PCP - General (General Practice) Campbell Riches, MD as Consulting Physician (Infectious Diseases) Leighton Ruff, MD as Consulting Physician (General Surgery) Kyung Rudd, MD as Consulting Physician (Radiation Oncology) 01/16/2018   CHIEF COMPLAINTS:  Follow up anal cancer  Oncology History   Anal cancer ([place])   Staging form: Anus, AJCC 8th Edition   - Clinical: Stage IIIA (cT2, cN1a, cM0) - Signed by Truitt Merle, MD on 12/10/2016      Anal cancer ([place])   11/29/2016 Initial Biopsy    Anal canal mass biopsy showed 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 Marcello Moores in OR:"There was an obvious anal mass noted in the left posterior lateral anal canal.  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   I then placed a Hill-Ferguson anoscope into the anal canal and evaluated this completely.  The mass was noted with an area of central necrosis invading into the sphincter complex.  The edges of the ulcer were trimmed away using scissors.  This was sent to pathology for further examination       12/06/2016 Imaging    CT chest, abdomen and pelvis with contrast showed apparent wall thickening in the distal rectum and anus, this is associated with 2 posterior left pelvic sidewall nodes, highly suspicious for metastatic disease. 10 mm right upper lobe pulmonary nodule has essential stippled calcification and associated calcified nodule tissue in the right hilum, probably granuloma. No other distant metastasis.       12/10/2016 Initial Diagnosis    Anal cancer ([place])      12/13/2016 Imaging    NM PET Image Initial (pi) Skull Base to Thigh  IMPRESSION: 1. Intensely hypermetabolic 4.0 x 3.3 x 6.3 cm  anal mass, compatible with known primary anal carcinoma. 2. Hypermetabolic bilateral internal iliac nodal metastases. 3. Hypermetabolic left inguinal nodal metastasis. 4. Solitary mildly hypermetabolic nonenlarged left para-aortic lymph node, suspicious for nodal metastasis. 5. No additional definite sites of hypermetabolic metastatic disease. 6. Nonspecific nonenlarged mildly hypermetabolic bilateral upper neck lymph nodes, statistically most likely to represent benign reactive nodes. 7. Mild paraseptal emphysema       12/16/2016 - 01/27/2017 Radiation Therapy    Definitive radiation to his anal cancer, 55 Gy in 30 fractions       12/16/2016 - 01/24/2017 Chemotherapy    Concurrent chemo Mitomycin 10mg /m2 on day 1 and 35 and 5-fu 1000mg /m2/day on day 1-5 and 35-39 (second cycle postponed due to scheduling issue), pt tolerated well         05/01/2017 Imaging    PET  IMPRESSION: 1. No convincing findings of residual anal neoplasm. No residual discrete anal mass on the CT images. Vague mild anal hypermetabolism (max SUV 3.9), markedly decreased, with mild circumferential anal wall thickening, favored to represent post treatment change. 2. No convincing findings of residual metastatic disease. Solitary mildly hypermetabolic nonenlarged left level 2 neck lymph node is decreased in size and metabolism and is statistically more likely to represent a benign reactive node. No residual hypermetabolism within the previously visualized left para-aortic, left inguinal or internal iliac lymph nodes.      08/01/2017 Surgery    ANAL EXAM UNDER ANESTHESIA WITH BIOPSY by Dr. Marcello Moores 08/01/17  08/01/2017 Pathology Results    Diagnosis 08/01/17 Anus, biopsy, of cancer scar - SQUAMOUS MUCOSA WITH CONDYLOMATOUS CHANGE (AIN-I, MILD DYSPLASIA). - BENIGN COLORECTAL MUCOSA. - NO MALIGNANCY IDENTIFIED.       HISTORY OF PRESENTING ILLNESS (12/11/2015):  Juan Harrison 47 y.o. male with past medical  history of HIV, is here because of His recent diagnosed anal cancer. He is accompanied by his sister to the clinic today.  He has had bloody BM and rectal pain for one month, his rectal bleeding is mild, mixed with stool, no blood clots. He rates his rectal pain at 7-8/10, especially with bowel movement. he takes oxycodone 1-2/day.   Patient was seen by colorectal surgeon Dr. Marcello Moores, could not tolerate rectal exam and anoscope in office, was brought to the OR for the above exam, which showed " There was an obvious anal mass noted in the left posterior lateral anal canal.  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   I then placed a Hill-Ferguson anoscope into the anal canal and evaluated this completely.  The mass was noted with an area of central necrosis invading into the sphincter complex.  The edges of the ulcer were trimmed away using scissors.  This was sent to pathology for further examination". The biopsy showed squamous cell carcinoma.  He was found to be HIV positive in 2007, has been seen Per ID Dr. [name], not very compliant. He previously on  DRVr/TRV/EPZ bactrim, changed to genvoya 05-2016.  His last CD4 was 110 on 10/29/2016.   He is single, lives alone in a two story townhouse. He does not work, on [employer]. He has a sister Juan Harrison who lives in [place]. He has some friends, only able to help if he needs.  CURRENT THERAPY: Surveillance.   INTERIM HISTORY:  Juan Harrison returns for follow up. He presents to the clinic today by himself. He reports he is doing well overall. He endorses a good appetite. He has gained weight back. He reports his bowel movements are usually normal with occasional diarrhea. He notes to have some small bumps around his rectal area that has been bothering him. There is some itching.   He believes he saw his surgeon, Dr. Marcello Moores 1 year ago and had an endoscope and he thinks everything was normal. He  also reports he has quit smoking in Mid-January 2019  On review of systems, pt denies blood in stool, abdominal pain, cough, rectal pain or any other complaints at this time. Pertinent positives are listed and detailed within the above HPI.   MEDICAL HISTORY:  Past Medical History:  Diagnosis Date  . AIDS [facility]) infectious disease--- dr hatcher   dx 2007,  usCD4,  count at 110  . Anal cancer [facility]) oncologist-  dr Malika Demario/  dr moody   intial dx 12-10-2016--  Invasive Squamous Cell carcinoma in situ, Stage IIIA ([id number]) --- completed concurrent radiation and chemotherapy (rxt 01-27-2017 chemo 01-24-2017)    . Anal condyloma   . Hepatitis B surface antigen positive   . History of cancer chemotherapy 12-16-2016 to 01-24-2017  . History of radiation therapy 12-16-2016 to 01-27-2017   55Gy in 30 sessions  . Pulmonary nodule, right    per Chest CT 12-06-2008  . Rectal mass    anal    SURGICAL HISTORY: Past Surgical History:  Procedure Laterality Date  . IR GENERIC HISTORICAL  12/16/2016   IR US GUIDE VASC ACCESS RIGHT 12/16/2016 Arne Cleveland, MD  [facility]INTER[facility]  . IR GENERIC HISTORICAL  12/16/2016   IR FLUORO GUIDE CV LINE RIGHT 12/16/2016 Arne Cleveland, MD [facility]  . RECTAL BIOPSY N/A 11/29/2016   Procedure: BIOPSY ANAL CANAL MASS;  Surgeon: Leighton Ruff, MD;  Location: [facility];  Service: General;  Laterality: N/A;  . RECTAL EXAM UNDER ANESTHESIA N/A 11/29/2016   Procedure: EXAM UNDER ANESTHESIA;  Surgeon: Leighton Ruff, MD;  Location: [facility];  Service: General;  Laterality: N/A;  . RECTAL EXAM UNDER ANESTHESIA N/A 08/01/2017   Procedure: ANAL EXAM UNDER ANESTHESIA WITH BIOPSY;  Surgeon: Leighton Ruff, MD;  Location: [facility];  Service: General;  Laterality: N/A;  . 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 Marital status: Single    Spouse name: Not on file  .  Number of children: Not on file  . Years of education: Not on file  . Highest education level: Not on file  Social Needs  . Financial resource strain: Not on file  . Food insecurity - worry: Not on file  . Food insecurity - inability: Not on file  . Transportation needs - medical: Not on file  . Transportation needs - non-medical: Not on file  Occupational History  . Not on file  Tobacco Use  . Smoking status: Current Every Day Smoker    Packs/day: 0.25    Years: 20.00    Pack years: 5.00    Types: Cigarettes    Last attempt to quit: 10/13/2016    Years since quitting: 1.2  . Smokeless tobacco: Never Used  . Tobacco comment: want the patch  Substance and Sexual Activity  . Alcohol use: No    Alcohol/week: 0.6 oz    Types: 1 Standard drinks or equivalent per week    Comment: previously drank alcohol on weekends for 25 years, he quit on 10/14/2016   . Drug use: Yes    Types: Marijuana  . Sexual activity: Not on file  Other Topics Concern  . Not on file  Social History Narrative   Single, lives alone about 10 minutes from [place] in 2 story townhouse   Has one sister, Juan Harrison who lives in [place] in past via friends   On disability    FAMILY HISTORY: Family History  Problem Relation Age of Onset  . COPD Mother   . Asthma Brother     ALLERGIES:  has No Known Allergies.  MEDICATIONS:  Current Outpatient Medications  Medication Sig Dispense Refill  . elvitegravir-cobicistat-emtricitabine-tenofovir (GENVOYA) 150-150-200-10 MG TABS tablet Take 1 tablet daily with breakfast by mouth. 30 tablet 5  . nicotine (NICODERM CQ - DOSED IN MG/24 HR) 7 mg/24hr patch Place 1 patch (7 mg total) onto the skin daily for 14 days. Remove old patch before applying new patch. 14 patch 0  . nicotine (NICODERM CQ - DOSED IN MG/24 HOURS) 14 mg/24hr patch Place 1 patch (14 mg total) onto the skin daily for 42 doses. Remove old patch before applying new patch. 42 patch 0   No  current facility-administered medications for this visit.     REVIEW OF SYSTEMS:   Constitutional: Denies fevers, chills or abnormal night sweats (+) appetite gain  Eyes: Denies blurriness of vision, double vision or watery eyes Ears, nose, mouth, throat, and face: Denies mucositis or sore throat Respiratory: Denies cough, dyspnea or wheezes Cardiovascular: Denies palpitation, chest  discomfort or lower extremity swelling Gastrointestinal:  Denies nausea, heartburn or change in bowel habits Skin: Denies abnormal skin rashes  Lymphatics: Denies new lymphadenopathy or easy bruising Neurological:Denies numbness, tingling or new weaknesses Behavioral/Psych: Mood is stable, no new changes  All other systems were reviewed with the patient and are negative.  PHYSICAL EXAMINATION: ECOG PERFORMANCE STATUS: 1 - Symptomatic but completely ambulatory  Vitals:   01/16/18 1149  BP: (!) 89/59  Pulse: 81  Resp: 18  Temp: 97.8 F (36.6 C)  SpO2: 100%   Filed Weights   01/16/18 1149  Weight: 153 lb 9.6 oz (69.7 kg)    GENERAL:alert, no distress and comfortable SKIN: skin irritation I the perirectal area due to previous folliculitis  EYES: normal, conjunctiva are pink and non-injected, sclera clear OROPHARYNX:no exudate, no erythema and lips, buccal mucosa, and tongue normal  NECK: supple, thyroid normal size, non-tender, without nodularity LYMPH:  no palpable lymphadenopathy in the cervical, axillary or inguinal LUNGS: clear to auscultation and percussion with normal breathing effort HEART: regular rate & rhythm and no murmurs and no lower extremity edema ABDOMEN:abdomen soft, non-tender and normal bowel sounds, inspection showed no mass at anus, no palpable mass on the rectal exam, she had a moderate pain with the rectal exam Musculoskeletal:no cyanosis of digits and no clubbing PSYCH: alert & oriented x 3 with fluent speech NEURO: no focal motor/sensory deficits Rectal Exam: Limited due to  pain. He has internal and external hemorrhoids. No palpable mass.   LABORATORY DATA:  I have reviewed the data as listed CBC Latest Ref Rng & Units 01/16/2018 09/04/2017 08/01/2017  WBC 4.0 - 10.3 K/uL 4.0 3.8(L) -  Hemoglobin 13.0 - 17.1 g/dL 14.1 13.4 13.2  Hematocrit 38.4 - 49.9 % 43.1 40.2 -  Platelets 140 - 400 K/uL 179 238 -   CMP Latest Ref Rng & Units 01/16/2018 09/04/2017 05/02/2017  Glucose 70 - 140 mg/dL 68(L) 114 116  BUN 7 - 26 mg/dL 10 14.0 8.3  Creatinine 0.70 - 1.30 mg/dL 1.33(H) 1.3 1.2  Sodium 136 - 145 mmol/L 140 140 144  Potassium 3.5 - 5.1 mmol/L 4.0 3.9 3.6  Chloride 98 - 109 mmol/L 105 - -  CO2 22 - 29 mmol/L 27 27 28   Calcium 8.4 - 10.4 mg/dL 9.4 9.6 9.0  Total Protein 6.4 - 8.3 g/dL 7.1 7.5 6.9  Total Bilirubin 0.2 - 1.2 mg/dL 0.6 0.33 0.42  Alkaline Phos 40 - 150 U/L 95 82 86  AST 5 - 34 U/L 16 17 18   ALT 0 - 55 U/L 17 25 17    PATHOLOGY REPORT  Diagnosis 11/29/2016 Anus, biopsy, anal canal mass - INVASIVE MODERATELY DIFFERENTIATED SQUAMOUS CELL CARCINOMA ASSOCIATED WITH SQUAMOUS CELL CARCINOMA IN SITU.  RADIOGRAPHIC STUDIES: I have personally reviewed the radiological images as listed and agreed with the findings in the report.  PET 05/01/2017 IMPRESSION: 1. No convincing findings of residual anal neoplasm. No residual discrete anal mass on the CT images. Vague mild anal hypermetabolism (max SUV 3.9), markedly decreased, with mild circumferential anal wall thickening, favored to represent post treatment change. 2. No convincing findings of residual metastatic disease. Solitary mildly hypermetabolic nonenlarged left level 2 neck lymph node is decreased in size and metabolism and is statistically more likely to represent a benign reactive node. No residual hypermetabolism within the previously visualized left para-aortic, left inguinal or internal iliac lymph nodes.  PET 12/13/2016 IMPRESSION: 1. Intensely hypermetabolic 4.0 x 3.3 x 6.3 cm anal mass,  compatible with  known primary anal carcinoma. 2. Hypermetabolic bilateral internal iliac nodal metastases. 3. Hypermetabolic left inguinal nodal metastasis. 4. Solitary mildly hypermetabolic nonenlarged left para-aortic lymph node, suspicious for nodal metastasis. 5. No additional definite sites of hypermetabolic metastatic disease. 6. Nonspecific nonenlarged mildly hypermetabolic bilateral upper neck lymph nodes, statistically most likely to represent benign reactive nodes. 7. Mild paraseptal emphysema .  ASSESSMENT & PLAN:  47 y.o. African-American male, with history of HIV and AIDS, on genvoya since 03/2016,presents with rectal bleeding and pain.  1.  Anal cancer, invasive squamous cell carcinoma,cT2N1aM0, stage IIIA -I previously reviewed his CT scan findings and anal mass biopsy results in details with patient and his sister. -His previous CT scan showed significantly intact left internal iliac lymph node, highly suspicious for metastasis. No other distant metastasis on CT scan -He has locally advanced anal cancer, I discussed the standard therapy with concurrent chemotherapy and radiation. We usually preserve surgery for residual cancer after chemotherapy and radiation, or recurrent disease. -We previously discussed that his anal cancer will likely be cured by concurrent chemotherapy and radiation, although his HIV and low CD4 count may predicts high risk for chemoradiation, medications, and poor prognosis for his anal cancer. -he now just completed the standard radiation and chemotherapy regiment with mitomycin and 5-FU infusion, received full dose for 2 cycles, second cycle was postponed for one week due to scheduling issues. -He overall tolerated chemotherapy well, did not have severe cytopenias. -He is recovering well from chemoradiation. Lab results reviewed with patient. -I reviewed his surveillance PET scan from 05/01/2017, which showed a complete metabolic response to  treatment. -due to his previous hypermetabolic retroperitoneal lymph nodes, he is at very high risk for recurrence. Repeat PET scan in 3 months -His rectal exam was normal although limited due to pain. Labs reviewed, his Cr is 1.33 slightly elevated, I advised him to drink more fluids. No clinical concern for recurrence. -F/u in 3 months after scan   2. Rectal bleeding and pain  -Secondary to his anal cancer and radiation dermatitis.  -Complete resolved after treatment. He is off narcotics.  3. HIV/AIDS -His treatment was switched to genvoya in 05/2016, his CD4 has improved since then, but overall still low -continue HIV therapy when on chemo, will check with Dr. [name]  -he is continuing with his treatment    4. Smoking cessation -he reports he has quit smoking in Mid-January 2019  Plan F/u in 3 months (the end of May) Lab and PET scan a few days before visit    All questions were answered. The patient knows to call the clinic with any problems, questions or concerns.  I spent 20 minutes counseling the patient face to face. The total time spent in the appointment was 25 minutes and more than 50% was on counseling.  This document serves as a record of services personally performed by Truitt Merle, MD. It was created on her behalf by Theresia Bough, a trained medical scribe. The creation of this record is based on the scribe's personal observations and the provider's statements to them.   I have reviewed the above documentation for accuracy and completeness, and I agree with the above.    Truitt Merle, MD 01/16/2018 5:18 PM

## 2018-01-16 ENCOUNTER — Inpatient Hospital Stay (HOSPITAL_BASED_OUTPATIENT_CLINIC_OR_DEPARTMENT_OTHER): Payer: Medicare Other | Admitting: Hematology

## 2018-01-16 ENCOUNTER — Telehealth: Payer: Self-pay

## 2018-01-16 ENCOUNTER — Encounter: Payer: Self-pay | Admitting: Hematology

## 2018-01-16 ENCOUNTER — Inpatient Hospital Stay: Payer: Medicare Other | Attending: Hematology

## 2018-01-16 VITALS — BP 89/59 | HR 81 | Temp 97.8°F | Resp 18 | Ht 75.0 in | Wt 153.6 lb

## 2018-01-16 DIAGNOSIS — Z87891 Personal history of nicotine dependence: Secondary | ICD-10-CM | POA: Insufficient documentation

## 2018-01-16 DIAGNOSIS — C210 Malignant neoplasm of anus, unspecified: Secondary | ICD-10-CM | POA: Insufficient documentation

## 2018-01-16 DIAGNOSIS — Z21 Asymptomatic human immunodeficiency virus [HIV] infection status: Secondary | ICD-10-CM | POA: Insufficient documentation

## 2018-01-16 DIAGNOSIS — Z79899 Other long term (current) drug therapy: Secondary | ICD-10-CM | POA: Insufficient documentation

## 2018-01-16 DIAGNOSIS — Z923 Personal history of irradiation: Secondary | ICD-10-CM | POA: Insufficient documentation

## 2018-01-16 DIAGNOSIS — K625 Hemorrhage of anus and rectum: Secondary | ICD-10-CM | POA: Insufficient documentation

## 2018-01-16 DIAGNOSIS — Z9221 Personal history of antineoplastic chemotherapy: Secondary | ICD-10-CM | POA: Diagnosis not present

## 2018-01-16 DIAGNOSIS — C775 Secondary and unspecified malignant neoplasm of intrapelvic lymph nodes: Secondary | ICD-10-CM | POA: Insufficient documentation

## 2018-01-16 DIAGNOSIS — B20 Human immunodeficiency virus [HIV] disease: Secondary | ICD-10-CM

## 2018-01-16 LAB — CBC WITH DIFFERENTIAL/PLATELET
BASOS PCT: 0 %
Basophils Absolute: 0 10*3/uL (ref 0.0–0.1)
Eosinophils Absolute: 0.1 10*3/uL (ref 0.0–0.5)
Eosinophils Relative: 3 %
HCT: 43.1 % (ref 38.4–49.9)
HEMOGLOBIN: 14.1 g/dL (ref 13.0–17.1)
Lymphocytes Relative: 37 %
Lymphs Abs: 1.5 10*3/uL (ref 0.9–3.3)
MCH: 30.1 pg (ref 27.2–33.4)
MCHC: 32.7 g/dL (ref 32.0–36.0)
MCV: 91.9 fL (ref 79.3–98.0)
MONOS PCT: 10 %
Monocytes Absolute: 0.4 10*3/uL (ref 0.1–0.9)
NEUTROS ABS: 2 10*3/uL (ref 1.5–6.5)
NEUTROS PCT: 50 %
NRBC: 0 /100{WBCs}
Platelets: 179 10*3/uL (ref 140–400)
RBC: 4.69 MIL/uL (ref 4.20–5.82)
RDW: 14.8 % — ABNORMAL HIGH (ref 11.0–14.6)
WBC: 4 10*3/uL (ref 4.0–10.3)

## 2018-01-16 LAB — COMPREHENSIVE METABOLIC PANEL
ALK PHOS: 95 U/L (ref 40–150)
ALT: 17 U/L (ref 0–55)
ANION GAP: 8 (ref 3–11)
AST: 16 U/L (ref 5–34)
Albumin: 3.6 g/dL (ref 3.5–5.0)
BUN: 10 mg/dL (ref 7–26)
CALCIUM: 9.4 mg/dL (ref 8.4–10.4)
CHLORIDE: 105 mmol/L (ref 98–109)
CO2: 27 mmol/L (ref 22–29)
Creatinine, Ser: 1.33 mg/dL — ABNORMAL HIGH (ref 0.70–1.30)
GFR calc non Af Amer: >60 mL/min (ref >60)
Glucose, Bld: 68 mg/dL — ABNORMAL LOW (ref 70–140)
Potassium: 4 mmol/L (ref 3.5–5.1)
SODIUM: 140 mmol/L (ref 136–145)
Total Bilirubin: 0.6 mg/dL (ref 0.2–1.2)
Total Protein: 7.1 g/dL (ref 6.4–8.3)

## 2018-01-16 NOTE — Telephone Encounter (Signed)
Printed avs and calender of upcoming appointment. Per 2/15 los 

## 2018-02-12 ENCOUNTER — Ambulatory Visit (INDEPENDENT_AMBULATORY_CARE_PROVIDER_SITE_OTHER): Payer: Medicare Other | Admitting: Infectious Diseases

## 2018-02-12 ENCOUNTER — Encounter: Payer: Self-pay | Admitting: Infectious Diseases

## 2018-02-12 VITALS — BP 130/67 | HR 80 | Temp 98.0°F | Wt 152.0 lb

## 2018-02-12 DIAGNOSIS — A6002 Herpesviral infection of other male genital organs: Secondary | ICD-10-CM | POA: Diagnosis not present

## 2018-02-12 DIAGNOSIS — B20 Human immunodeficiency virus [HIV] disease: Secondary | ICD-10-CM

## 2018-02-12 MED ORDER — GABAPENTIN 300 MG PO CAPS: 300.0000 mg | ORAL_CAPSULE | Freq: Two times a day (BID) | ORAL | 0 refills | Status: DC

## 2018-02-12 MED ORDER — VALACYCLOVIR HCL 1 G PO TABS
1000.0000 mg | ORAL_TABLET | Freq: Two times a day (BID) | ORAL | 0 refills | Status: AC
Start: 2018-02-12 — End: 2018-02-22

## 2018-02-12 MED FILL — GABAPENTIN 300 MG CAPSULE: 300 | 15 days supply | Qty: 30 | Fill #0

## 2018-02-12 MED FILL — valACYclovir HCL 1 GM TABS: 1 | 10 days supply | Qty: 20 | Fill #0

## 2018-02-12 NOTE — Assessment & Plan Note (Signed)
Appearance of rash most c/w herpes infection. I have sent in rx for valtrex 1gm BID for 10 days considering his CD4 is still < 200 and he has a pretty extensive involvement (extent of rash not fully pictured d/t pain). Have advised cool compresses, gabapentin 300 mg QHS to BID based on tolerability of this mediation and ibuprofen for pain control. Would encourage only cool/tepid water for bathing. HSV viral culture collected today although collecting sample was painful for him and uncertain as to quantity collected.   Counseled on HSV and requested him to abstain from all forms of sex with other contacts for now. Advised to call back if he has no improvement with the above measures.

## 2018-02-12 NOTE — Assessment & Plan Note (Signed)
Follow up as planned with Dr. [name].

## 2018-02-12 NOTE — Patient Instructions (Addendum)
I have sent in Valtrex and gabapentin for you. The Valtrex should be taken twice a day for 7-10 days. Medications are at [facility].   The gabapentin is a pain medication that you can start taking once at night and then increase to twice a day.   Cool compresses and Ibuprofen for pain as well.   The first outbreak is always the worst. Please do not have any sexual contact with anyone until we get you treated - this is highly infectious and can be passed easily.   Genital Herpes Genital herpes is a common sexually transmitted infection (STI) that is caused by a virus. The virus spreads from person to person through sexual contact. Infection can cause itching, blisters, and sores around the genitals or rectum. Symptoms may last several days and then go away This is called an outbreak. However, the virus remains in your body, so you may have more outbreaks in the future. The time between outbreaks varies and can be months or years. Genital herpes affects men and women. It is particularly concerning for pregnant women because the virus can be passed to the baby during delivery and can cause serious problems. Genital herpes is also a concern for people who have a weak disease-fighting (immune) system. What are the causes? This condition is caused by the herpes simplex virus (HSV) type 1 or type 2. The virus may spread through:  Sexual contact with an infected person, including vaginal, anal, and oral sex.  Contact with fluid from a herpes sore.  The skin. This means that you can get herpes from an infected partner even if he or she does not have a visible sore or does not know that he or she is infected.  What increases the risk? You are more likely to develop this condition if:  You have sex with many partners.  You do not use latex condoms during sex.  What are the signs or symptoms? Most people do not have symptoms (asymptomatic) or have mild symptoms that may be  mistaken for other skin problems. Symptoms may include:  Small red bumps near the genitals, rectum, or mouth. These bumps turn into blisters and then turn into sores.  Flu-like symptoms, including: ? Fever. ? Body aches. ? Swollen lymph nodes. ? Headache.  Painful urination.  Pain and itching in the genital area or rectal area.  Vaginal discharge.  Tingling or shooting pain in the legs and buttocks.  Generally, symptoms are more severe and last longer during the first (primary) outbreak. Flu-like symptoms are also more common during the primary outbreak. How is this diagnosed? Genital herpes may be diagnosed based on:  A physical exam.  Your medical history.  Blood tests.  A test of a fluid sample (culture) from an open sore.  How is this treated? There is no cure for this condition, but treatment with antiviral medicines that are taken by mouth (orally) can do the following:  Speed up healing and relieve symptoms.  Help to reduce the spread of the virus to sexual partners.  Limit the chance of future outbreaks, or make future outbreaks shorter.  Lessen symptoms of future outbreaks.  Your health care provider may also recommend pain relief medicines, such as aspirin or ibuprofen. Follow these instructions at home: Sexual activity  Do not have sexual contact during active outbreaks.  Practice safe sex. Latex condoms and male condoms may help prevent the spread of the herpes virus. General instructions  Keep the affected areas  dry and clean.  Take over-the-counter and prescription medicines only as told by your health care provider.  Avoid rubbing or touching blisters and sores. If you do touch blisters or sores: ? Wash your hands thoroughly with soap and water. ? Do not touch your eyes afterward.  To help relieve pain or itching, you may take the following actions as directed by your health care provider: ? Apply a cold, wet cloth (cold compress) to  affected areas 4-6 times a day. ? Apply a substance that protects your skin and reduces bleeding (astringent). ? Apply a gel that helps relieve pain around sores (lidocaine gel). ? Take a warm, shallow bath that cleans the genital area (sitz bath).  Keep all follow-up visits as told by your health care provider. This is important. How is this prevented?  Use condoms. Although anyone can get genital herpes during sexual contact, even with the use of a condom, a condom can provide some protection.  Avoid having multiple sexual partners.  Talk with your sexual partner about any symptoms either of you may have. Also, talk with your partner about any history of STIs.  Get tested for STIs before you have sex. Ask your partner to do the same.  Do not have sexual contact if you have symptoms of genital herpes. Contact a health care provider if:  Your symptoms are not improving with medicine.  Your symptoms return.  You have new symptoms.  You have a fever.  You have abdominal pain.  You have redness, swelling, or pain in your eye.  You notice new sores on other parts of your body.  You are a woman and experience bleeding between menstrual periods.  You have had herpes and you become pregnant or plan to become pregnant. Summary  Genital herpes is a common sexually transmitted infection (STI) that is caused by the herpes simplex virus (HSV) type 1 or type 2.  These viruses are most often spread through sexual contact with an infected person.  You are more likely to develop this condition if you have sex with many partners or you have unprotected sex.  Most people do not have symptoms (asymptomatic) or have mild symptoms that may be mistaken for other skin problems. Symptoms occur as outbreaks that may happen months or years apart.  There is no cure for this condition, but treatment with oral antiviral medicines can reduce symptoms, reduce the chance of spreading the virus to a  partner, prevent future outbreaks, or shorten future outbreaks. This information is not intended to replace advice given to you by your health care provider. Make sure you discuss any questions you have with your health care provider. Document Released: 11/15/2000 Document Revised: 10/18/2016 Document Reviewed: 10/18/2016 Elsevier Interactive Patient Education  [employer].

## 2018-02-12 NOTE — Progress Notes (Signed)
Name: Juan Harrison  DOB: [birth year] MRN: [id number] PCP: Patient, No Pcp Per   Chief Complaint  Patient presents with  . Rash   Patient Active Problem List   Diagnosis Date Noted  . Herpes genitalis in men 02/12/2018  . Tobacco use 10/13/2017  . PICC (peripherally inserted central catheter) in place 01/07/2017  . Anal cancer ([place]) 12/10/2016  . Hepatitis B immune 10/29/2016  . AIDS ([name]) 12/02/2005    Brief Narrative: 47yo M with hx of HIV/AIDS. Dx ~2007. He is unclear how he got infected, was prev cared for in [place]. [place] Darunavir-norvir/truvada/Epzicom bactrim, at transfer. Hx of non-compliance.  He was changed to genvoya 05-2016. He has been seen by [facility]/Onc/Rad Onc for anal cancer.   Subjective:  Juan Harrison is here today after walking into clinic with painful, bumpy rash to neck/chest and genitals.  This is a new problem for him that started 3 days ago and now rapidly progressed to current involvement. He reported initially some itching and now he has bumpy rash all over his groin/buttocks and penis and some smaller spots on his face. Denies any drainage or fevers/chills but reports some extreme 10/10 pain when he tries to walk or sit. He has put some cool compresses to the area which did help. Took a hot shower today which caused worsened burning. Upon walking into the room he has his hands down his pants and reporting that he "has to hold himself a certain way d/t pain;" quickly touches head/neck after removing hands from groin.   One new sexual partner recently that involved oral sex performed on him. He has never had anything like this in the past. He does report changing soap and detergent but that was over 2 weeks ago.   He continues on his Genvoya and has had excellent suppression working with Dr. [name]. His CD4 count has been slower to reconstitute over the last 3 years. He did receive XRT and chemo for anal cancer however the XRT treatments were completed 13  months ago now.   HIV 1 RNA Quant (copies/mL)  Date Value  10/06/2017 <20 DETECTED (A)  10/29/2016 <20  07/22/2016 <20   CD4 (no units)  Date Value  12/11/2016 See Separate Report   CD4 T Cell Abs (/uL)  Date Value  10/06/2017 190 (L)  12/11/2016 180 (L)  10/29/2016 110 (L)   Review of Systems  Constitutional: Negative for chills and fever.  HENT: Negative for tinnitus.   Eyes: Negative for blurred vision and photophobia.  Respiratory: Negative for cough and sputum production.   Cardiovascular: Negative for chest pain.  Gastrointestinal: Negative for diarrhea, nausea and vomiting.  Genitourinary: Negative for dysuria.  Skin: Positive for itching and rash.  Neurological: Negative for headaches.    Past Medical History:  Diagnosis Date  . AIDS [facility]) infectious disease--- dr hatcher   dx 2007,  usCD4,  count at 110  . Anal cancer [facility]) oncologist-  dr feng/  dr moody   intial dx 12-10-2016--  Invasive Squamous Cell carcinoma in situ, Stage IIIA ([id number]) --- completed concurrent radiation and chemotherapy (rxt 01-27-2017 chemo 01-24-2017)    . Anal condyloma   . Hepatitis B surface antigen positive   . History of cancer chemotherapy 12-16-2016 to 01-24-2017  . History of radiation therapy 12-16-2016 to 01-27-2017   55Gy in 30 sessions  . Pulmonary nodule, right    per Chest CT 12-06-2008  . Rectal mass    anal    Outpatient Medications  Prior to Visit  Medication Sig Dispense Refill  . elvitegravir-cobicistat-emtricitabine-tenofovir (GENVOYA) 150-150-200-10 MG TABS tablet Take 1 tablet daily with breakfast by mouth. 30 tablet 5  . nicotine (NICODERM CQ - DOSED IN MG/24 HOURS) 14 mg/24hr patch Place 1 patch (14 mg total) onto the skin daily for 42 doses. Remove old patch before applying new patch. 42 patch 0  . nicotine (NICODERM CQ - DOSED IN MG/24 HR) 7 mg/24hr patch Place 1 patch (7 mg total) onto the skin daily for 14 days. Remove old patch before applying new  patch. 14 patch 0   No facility-administered medications prior to visit.      No Known Allergies  Social History   Tobacco Use  . Smoking status: Current Every Day Smoker    Packs/day: 0.25    Years: 20.00    Pack years: 5.00    Types: Cigarettes    Last attempt to quit: 10/13/2016    Years since quitting: 1.3  . Smokeless tobacco: Never Used  . Tobacco comment: want the patch  Substance Use Topics  . Alcohol use: No    Alcohol/week: 0.6 oz    Types: 1 Standard drinks or equivalent per week    Comment: previously drank alcohol on weekends for 25 years, he quit on 10/14/2016   . Drug use: Yes    Types: Marijuana    Family History  Problem Relation Age of Onset  . COPD Mother   . Asthma Brother     Social History   Substance and Sexual Activity  Sexual Activity Not on file     Objective:   Vitals:   02/12/18 1042  BP: 130/67  Pulse: 80  Temp: 98 F (36.7 C)  TempSrc: Oral  Weight: 152 lb (68.9 kg)   Body mass index is 19 kg/m.  Physical Exam  Constitutional: He is oriented to person, place, and time. Vital signs are normal.  Appears genuinely very uncomfortable in in a large amount of pain.   HENT:  Mouth/Throat: No oral lesions. Normal dentition. No dental caries.  Eyes: No scleral icterus.  Cardiovascular: Normal rate.  Pulmonary/Chest: Effort normal.  Abdominal: Soft.  Lymphadenopathy:    He has no cervical adenopathy.  Neurological: He is alert and oriented to person, place, and time.  Skin: Skin is warm and dry. Rash noted.     Psychiatric: Mood and affect normal.  Vitals reviewed.     Lab Results Lab Results  Component Value Date   WBC 4.0 01/16/2018   HGB 14.1 01/16/2018   HCT 43.1 01/16/2018   MCV 91.9 01/16/2018   PLT 179 01/16/2018    Lab Results  Component Value Date   CREATININE 1.33 (H) 01/16/2018   BUN 10 01/16/2018   NA 140 01/16/2018   K 4.0 01/16/2018   CL 105 01/16/2018   CO2 27 01/16/2018    Lab Results    Component Value Date   ALT 17 01/16/2018   AST 16 01/16/2018   ALKPHOS 95 01/16/2018   BILITOT 0.6 01/16/2018    Lab Results  Component Value Date   CHOL 117 (L) 02/29/2016   HDL 39 (L) 02/29/2016   LDLCALC 64 02/29/2016   TRIG 69 02/29/2016   CHOLHDL 3.0 02/29/2016   HIV 1 RNA Quant (copies/mL)  Date Value  10/06/2017 <20 DETECTED (A)  10/29/2016 <20  07/22/2016 <20   CD4 (no units)  Date Value  12/11/2016 See Separate Report   CD4 T Cell Abs (/uL)  Date  Value  10/06/2017 190 (L)  12/11/2016 180 (L)  10/29/2016 110 (L)   Lab Results  Component Value Date   HAV NON REACTIVE 02/29/2016   Lab Results  Component Value Date   HEPBSAG POSITIVE (A) 02/29/2016   HEPBSAB NEG 02/29/2016   Lab Results  Component Value Date   HCVAB NEGATIVE 02/29/2016   Lab Results  Component Value Date   CHLAMYDIAWP Negative 02/29/2016   N Negative 02/29/2016   No results found for: GCPROBEAPT Lab Results  Component Value Date   QUANTGOLD NEGATIVE 02/29/2016   No results found for: RPR   Assessment & Plan:   Problem List Items Addressed This Visit      Genitourinary   Herpes genitalis in men - Primary    Appearance of rash most c/w herpes infection. I have sent in rx for valtrex 1gm BID for 10 days considering his CD4 is still < 200 and he has a pretty extensive involvement (extent of rash not fully pictured d/t pain). Have advised cool compresses, gabapentin 300 mg QHS to BID based on tolerability of this mediation and ibuprofen for pain control. Would encourage only cool/tepid water for bathing. HSV viral culture collected today although collecting sample was painful for him and uncertain as to quantity collected.   Counseled on HSV and requested him to abstain from all forms of sex with other contacts for now. Advised to call back if he has no improvement with the above measures.       Relevant Medications   valACYclovir (VALTREX) 1000 MG tablet   Other Relevant Orders    Herpes simplex virus culture     Other   AIDS ([place])    Follow up as planned with Dr. [name].       Relevant Medications   valACYclovir (VALTREX) 1000 MG tablet     Janene Madeira, MSN, NP-C [facility] [place] Pager: [phone] Office: [phone]  02/12/18  1:21 PM

## 2018-02-16 LAB — HERPES SIMPLEX VIRUS CULTURE
MICRO NUMBER: 90325563
SPECIMEN QUALITY: ADEQUATE

## 2018-02-17 ENCOUNTER — Telehealth: Payer: Self-pay | Admitting: *Deleted

## 2018-02-17 NOTE — Telephone Encounter (Signed)
Patient called to let Col[place]ta [place] know that the rash has dried up, "now looks like little blackheads." RN advised patient not to scratch them, asked that he give it time to let the herpes dry and resolve on their own. Patient stated he would. Landis Gandy, RN

## 2018-02-26 MED FILL — GENVOYA TABLET: 150-150-200 | 30 days supply | Qty: 30 | Fill #2

## 2018-04-08 ENCOUNTER — Other Ambulatory Visit: Payer: Medicare Other

## 2018-04-09 MED FILL — GENVOYA TABLET: 150-150-200 | 30 days supply | Qty: 30 | Fill #3

## 2018-04-13 ENCOUNTER — Telehealth: Payer: Self-pay

## 2018-04-13 NOTE — Telephone Encounter (Signed)
Pt came in clinic today requesting that Dr. [name] fill out some paper work. Pt was informed that Dr. [name] was not in the office today. Pt stated he would leave the paper work at the office and will come back once it has been filled out. Pt would like for me to inform Dr. [name] that the paper work was left at the office. Will inform Dr. [name] that the pt stopped by. [place]

## 2018-04-15 ENCOUNTER — Ambulatory Visit: Payer: Medicare Other | Admitting: Infectious Diseases

## 2018-04-20 ENCOUNTER — Ambulatory Visit (HOSPITAL_COMMUNITY): Payer: Medicare Other

## 2018-04-20 ENCOUNTER — Telehealth: Payer: Self-pay | Admitting: *Deleted

## 2018-04-20 NOTE — Telephone Encounter (Signed)
Called the patient to advise his paperwork is ready for pick up and is in triage. Had to leave a message for him as he did not answer the line.

## 2018-04-24 ENCOUNTER — Other Ambulatory Visit: Payer: Self-pay | Admitting: Pharmacist

## 2018-04-24 ENCOUNTER — Ambulatory Visit (HOSPITAL_COMMUNITY)
Admission: RE | Admit: 2018-04-24 | Discharge: 2018-04-24 | Disposition: A | Discharge status: Home / Self Care | Payer: Medicare Other | Source: Ambulatory Visit | Attending: Hematology | Admitting: Hematology

## 2018-04-24 DIAGNOSIS — C210 Malignant neoplasm of anus, unspecified: Secondary | ICD-10-CM

## 2018-04-24 DIAGNOSIS — J439 Emphysema, unspecified: Secondary | ICD-10-CM | POA: Diagnosis not present

## 2018-04-24 DIAGNOSIS — J341 Cyst and mucocele of nose and nasal sinus: Secondary | ICD-10-CM | POA: Insufficient documentation

## 2018-04-24 DIAGNOSIS — R911 Solitary pulmonary nodule: Secondary | ICD-10-CM | POA: Insufficient documentation

## 2018-04-24 DIAGNOSIS — Z79899 Other long term (current) drug therapy: Secondary | ICD-10-CM | POA: Diagnosis not present

## 2018-04-24 LAB — GLUCOSE, CAPILLARY: Glucose-Capillary: 99 mg/dL (ref 65–99)

## 2018-04-24 MED ORDER — FLUDEOXYGLUCOSE F - 18 (FDG) INJECTION
8.8000 | Freq: Once | INTRAVENOUS | Status: AC | PRN
  Administered 2018-04-24: 8.8 via INTRAVENOUS

## 2018-04-24 NOTE — Progress Notes (Signed)
Doing well on Genvoya. No missed doses or side effects. He will see Dr. [name] on Tuesday at 2pm.

## 2018-04-28 ENCOUNTER — Ambulatory Visit (INDEPENDENT_AMBULATORY_CARE_PROVIDER_SITE_OTHER): Payer: Medicare Other | Admitting: Infectious Diseases

## 2018-04-28 ENCOUNTER — Encounter: Payer: Self-pay | Admitting: Infectious Diseases

## 2018-04-28 VITALS — BP 86/57 | HR 92 | Temp 98.1°F | Wt 140.0 lb

## 2018-04-28 DIAGNOSIS — B20 Human immunodeficiency virus [HIV] disease: Secondary | ICD-10-CM

## 2018-04-28 DIAGNOSIS — C210 Malignant neoplasm of anus, unspecified: Secondary | ICD-10-CM | POA: Diagnosis not present

## 2018-04-28 DIAGNOSIS — Z23 Encounter for immunization: Secondary | ICD-10-CM | POA: Diagnosis not present

## 2018-04-28 DIAGNOSIS — Z720 Tobacco use: Secondary | ICD-10-CM | POA: Diagnosis not present

## 2018-04-28 DIAGNOSIS — Z113 Encounter for screening for infections with a predominantly sexual mode of transmission: Secondary | ICD-10-CM

## 2018-04-28 DIAGNOSIS — Z79899 Other long term (current) drug therapy: Secondary | ICD-10-CM | POA: Diagnosis not present

## 2018-04-28 DIAGNOSIS — N182 Chronic kidney disease, stage 2 (mild): Secondary | ICD-10-CM

## 2018-04-28 MED ORDER — PNEUMOCOCCAL 13-VAL CONJ VACC IM SUSP
0.5000 mL | INTRAMUSCULAR | Status: AC
  Administered 2018-04-28: 0.5 mL via INTRAMUSCULAR

## 2018-04-28 MED ORDER — NICOTINE 14 MG/24HR TD PT24: 14.0000 mg | MEDICATED_PATCH | Freq: Every day | TRANSDERMAL | 0 refills | Status: DC

## 2018-04-28 MED ORDER — NICOTINE 21 MG/24HR TD PT24: 21.0000 mg | MEDICATED_PATCH | Freq: Every day | TRANSDERMAL | 0 refills | Status: DC

## 2018-04-28 MED ORDER — NICOTINE 7 MG/24HR TD PT24: 7.0000 mg | MEDICATED_PATCH | Freq: Every day | TRANSDERMAL | 0 refills | Status: DC

## 2018-04-28 NOTE — Progress Notes (Signed)
   Subjective:    Patient ID: Juan Harrison, male    DOB: [birth year], 47 y.o.   MRN: [id number]  HPI 47yo M with hx of HIV/AIDS. Dx ~2007. Prevon Darunavir-norvir/truvada/Epzicom bactrim (Hx of non-compliance).  He was changed to genvoya 05-2016. He has been seen by [facility]/Onc/Rad Onc for anal cancer. Finished CTX and XRT 01-2017.  Had XRT f/u 04-23-18. Has f/u 5-29.  Has been doing well.  No problems with ART. No other rx.    HIV 1 RNA Quant (copies/mL)  Date Value  10/06/2017 <20 DETECTED (A)  10/29/2016 <20  07/22/2016 <20   CD4 (no units)  Date Value  12/11/2016 See Separate Report   CD4 T Cell Abs (/uL)  Date Value  10/06/2017 190 (L)  12/11/2016 180 (L)  10/29/2016 110 (L)    Review of Systems  Constitutional: Positive for appetite change and unexpected weight change. Negative for chills and fever.  Respiratory: Negative for cough and shortness of breath.   Gastrointestinal: Negative for constipation and diarrhea.  Genitourinary: Negative for difficulty urinating.  eating less due to work.  Please see HPI. All other systems reviewed and negative.     Objective:   Physical Exam  Constitutional: He is oriented to person, place, and time. He appears well-developed and well-nourished.  HENT:  Mouth/Throat: No oropharyngeal exudate.  Eyes: Pupils are equal, round, and reactive to light. EOM are normal.  Neck: Normal range of motion. Neck supple.  Cardiovascular: Normal rate, regular rhythm and normal heart sounds.  Pulmonary/Chest: Effort normal and breath sounds normal.  Abdominal: Soft. Bowel sounds are normal. There is no tenderness. There is no guarding.  Musculoskeletal: Normal range of motion. He exhibits no edema.  Neurological: He is alert and oriented to person, place, and time.         Assessment & Plan:

## 2018-04-28 NOTE — Assessment & Plan Note (Signed)
Offered/refused condoms.  prevnar today Labs today Dental referral.  rtc in 9 months

## 2018-04-28 NOTE — Addendum Note (Signed)
Addended by: Dolan Amen D on: 04/28/2018 04:56 PM   Modules accepted: Orders

## 2018-04-28 NOTE — Addendum Note (Signed)
Addended by: Aundria Rud on: 04/28/2018 02:38 PM   Modules accepted: Orders

## 2018-04-28 NOTE — Assessment & Plan Note (Signed)
Cr is stable.

## 2018-04-28 NOTE — Assessment & Plan Note (Signed)
States his prev patch did not work Will try higher dose patch.

## 2018-04-28 NOTE — Assessment & Plan Note (Signed)
Appreciate continued onc f/u.  Appears to be stable by pt's report.

## 2018-04-29 ENCOUNTER — Inpatient Hospital Stay: Payer: Medicare Other | Attending: Hematology

## 2018-04-29 DIAGNOSIS — Z21 Asymptomatic human immunodeficiency virus [HIV] infection status: Secondary | ICD-10-CM | POA: Insufficient documentation

## 2018-04-29 DIAGNOSIS — C775 Secondary and unspecified malignant neoplasm of intrapelvic lymph nodes: Secondary | ICD-10-CM | POA: Diagnosis not present

## 2018-04-29 DIAGNOSIS — R911 Solitary pulmonary nodule: Secondary | ICD-10-CM | POA: Insufficient documentation

## 2018-04-29 DIAGNOSIS — C210 Malignant neoplasm of anus, unspecified: Secondary | ICD-10-CM | POA: Diagnosis not present

## 2018-04-29 DIAGNOSIS — Z79899 Other long term (current) drug therapy: Secondary | ICD-10-CM | POA: Insufficient documentation

## 2018-04-29 DIAGNOSIS — Z9221 Personal history of antineoplastic chemotherapy: Secondary | ICD-10-CM | POA: Insufficient documentation

## 2018-04-29 DIAGNOSIS — Z923 Personal history of irradiation: Secondary | ICD-10-CM | POA: Insufficient documentation

## 2018-04-29 DIAGNOSIS — K625 Hemorrhage of anus and rectum: Secondary | ICD-10-CM | POA: Insufficient documentation

## 2018-04-29 DIAGNOSIS — Z87891 Personal history of nicotine dependence: Secondary | ICD-10-CM | POA: Diagnosis not present

## 2018-04-29 LAB — CBC WITH DIFFERENTIAL/PLATELET
BASOS ABS: 0 10*3/uL (ref 0.0–0.1)
BASOS PCT: 1 %
Eosinophils Absolute: 0.2 10*3/uL (ref 0.0–0.5)
Eosinophils Relative: 5 %
HEMATOCRIT: 39.3 % (ref 38.4–49.9)
Hemoglobin: 13 g/dL (ref 13.0–17.1)
Lymphocytes Relative: 29 %
Lymphs Abs: 1.2 10*3/uL (ref 0.9–3.3)
MCH: 30.2 pg (ref 27.2–33.4)
MCHC: 33.1 g/dL (ref 32.0–36.0)
MCV: 91.2 fL (ref 79.3–98.0)
MONO ABS: 0.4 10*3/uL (ref 0.1–0.9)
MONOS PCT: 9 %
NEUTROS ABS: 2.4 10*3/uL (ref 1.5–6.5)
Neutrophils Relative %: 56 %
PLATELETS: 207 10*3/uL (ref 140–400)
RBC: 4.31 MIL/uL (ref 4.20–5.82)
RDW: 14 % (ref 11.0–14.6)
WBC: 4.2 10*3/uL (ref 4.0–10.3)

## 2018-04-29 LAB — COMPREHENSIVE METABOLIC PANEL
ALBUMIN: 3.8 g/dL (ref 3.5–5.0)
ALK PHOS: 77 U/L (ref 40–150)
ALT: 30 U/L (ref 0–55)
ANION GAP: 11 (ref 3–11)
AST: 26 U/L (ref 5–34)
BILIRUBIN TOTAL: 0.3 mg/dL (ref 0.2–1.2)
BUN: 15 mg/dL (ref 7–26)
CALCIUM: 8.9 mg/dL (ref 8.4–10.4)
CO2: 23 mmol/L (ref 22–29)
CREATININE: 1.32 mg/dL — AB (ref 0.70–1.30)
Chloride: 104 mmol/L (ref 98–109)
GFR calc Af Amer: >60 mL/min (ref >60)
GFR calc non Af Amer: >60 mL/min (ref >60)
GLUCOSE: 144 mg/dL — AB (ref 70–140)
Potassium: 3.8 mmol/L (ref 3.5–5.1)
SODIUM: 138 mmol/L (ref 136–145)
TOTAL PROTEIN: 7 g/dL (ref 6.4–8.3)

## 2018-04-29 LAB — T-HELPER CELL (CD4) - (RCID CLINIC ONLY)
CD4 % Helper T Cell: 21 % — ABNORMAL LOW (ref 33–55)
CD4 T Cell Abs: 280 /uL — ABNORMAL LOW (ref 400–2700)

## 2018-04-30 LAB — HIV-1 RNA QUANT-NO REFLEX-BLD
HIV 1 RNA Quant: <20 copies/mL — AB
HIV-1 RNA QUANT, LOG: DETECTED {Log_copies}/mL — AB

## 2018-04-30 NOTE — Progress Notes (Signed)
[facility]  [phone] Fax:(336) [phone]  Clinic follow up Note   Patient Care Team: Patient, No Pcp Per as PCP - General (General Practice) [name] [name], MD as Consulting Physician (Infectious Diseases) Leighton Ruff, MD as Consulting Physician (General Surgery) Kyung Rudd, MD as Consulting Physician (Radiation Oncology)   Date of Service:  05/01/2018   CHIEF COMPLAINTS:  Follow up anal cancer  Oncology History   Anal cancer [facility])   Staging form: Anus, AJCC 8th Edition   - Clinical: Stage IIIA (cT2, cN1a, cM0) - Signed by Truitt Merle, MD on 12/10/2016      Anal cancer ([place])   11/29/2016 Initial Biopsy    Anal canal mass biopsy showed 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 Marcello Moores in OR:"There was an obvious anal mass noted in the left posterior lateral anal canal.  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   I then placed a Hill-Ferguson anoscope into the anal canal and evaluated this completely.  The mass was noted with an area of central necrosis invading into the sphincter complex.  The edges of the ulcer were trimmed away using scissors.  This was sent to pathology for further examination       12/06/2016 Imaging    CT chest, abdomen and pelvis with contrast showed apparent wall thickening in the distal rectum and anus, this is associated with 2 posterior left pelvic sidewall nodes, highly suspicious for metastatic disease. 10 mm right upper lobe pulmonary nodule has essential stippled calcification and associated calcified nodule tissue in the right hilum, probably granuloma. No other distant metastasis.       12/10/2016 Initial Diagnosis    Anal cancer ([place])      12/13/2016 Imaging    NM PET Image Initial (pi) Skull Base to Thigh  IMPRESSION: 1. Intensely hypermetabolic  4.0 x 3.3 x 6.3 cm anal mass, compatible with known primary anal carcinoma. 2. Hypermetabolic bilateral internal iliac nodal metastases. 3. Hypermetabolic left inguinal nodal metastasis. 4. Solitary mildly hypermetabolic nonenlarged left para-aortic lymph node, suspicious for nodal metastasis. 5. No additional definite sites of hypermetabolic metastatic disease. 6. Nonspecific nonenlarged mildly hypermetabolic bilateral upper neck lymph nodes, statistically most likely to represent benign reactive nodes. 7. Mild paraseptal emphysema       12/16/2016 - 01/27/2017 Radiation Therapy    Definitive radiation to his anal cancer, 55 Gy in 30 fractions       12/16/2016 - 01/24/2017 Chemotherapy    Concurrent chemo Mitomycin 10mg /m2 on day 1 and 35 and 5-fu 1000mg /m2/day on day 1-5 and 35-39 (second cycle postponed due to scheduling issue), pt tolerated well         05/01/2017 Imaging    PET  IMPRESSION: 1. No convincing findings of residual anal neoplasm. No residual discrete anal mass on the CT images. Vague mild anal hypermetabolism (max SUV 3.9), markedly decreased, with mild circumferential anal wall thickening, favored to represent post treatment change. 2. No convincing findings of residual metastatic disease. Solitary mildly hypermetabolic nonenlarged left level 2 neck lymph node is decreased in size and metabolism and is statistically more likely to represent a benign reactive node. No residual hypermetabolism within the previously visualized left para-aortic, left inguinal or internal iliac lymph nodes.      08/01/2017 Surgery    ANAL EXAM UNDER ANESTHESIA WITH BIOPSY  by Dr. Marcello Moores 08/01/17      08/01/2017 Pathology Results    Diagnosis 08/01/17 Anus, biopsy, of cancer scar - SQUAMOUS MUCOSA WITH CONDYLOMATOUS CHANGE (AIN-I, MILD DYSPLASIA). - BENIGN COLORECTAL MUCOSA. - NO MALIGNANCY IDENTIFIED.      04/24/2018 PET scan    PET 04/24/18  IMPRESSION: 1. The 1.0 cm right upper lobe  pulmonary nodule has not increased in size compared to the earliest available comparison of 12/06/2016. It has low-grade metabolic activity with maximum SUV of 1.6. Although probably benign, low-grade adenocarcinoma is a less likely differential diagnostic consideration. I would suggest a follow up noncontrast CT of the chest in 1 years time. 2. Other imaging findings of potential clinical significance: Emphysema (ICD10-J43.9). Mucous retention cyst in the right maxillary sinus. Prominent stool throughout the colon favors constipation.       HISTORY OF PRESENTING ILLNESS (12/11/2015):  Juan Harrison 47 y.o. male with past medical history of HIV, is here because of His recent diagnosed anal cancer. He is accompanied by his sister to the clinic today.  He has had bloody BM and rectal pain for one month, his rectal bleeding is mild, mixed with stool, no blood clots. He rates his rectal pain at 7-8/10, especially with bowel movement. he takes oxycodone 1-2/day.   Patient was seen by colorectal surgeon Dr. Marcello Moores, could not tolerate rectal exam and anoscope in office, was brought to the OR for the above exam, which showed " There was an obvious anal mass noted in the left posterior lateral anal canal.  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   I then placed a Hill-Ferguson anoscope into the anal canal and evaluated this completely.  The mass was noted with an area of central necrosis invading into the sphincter complex.  The edges of the ulcer were trimmed away using scissors.  This was sent to pathology for further examination". The biopsy showed squamous cell carcinoma.  He was found to be HIV positive in 2007, has been seen Per ID Dr. [name], not very compliant. He previously on  DRVr/TRV/EPZ bactrim, changed to genvoya 05-2016.  His last CD4 was 110 on 10/29/2016.   He is single, lives alone in a two story townhouse. He does not work, on  [employer]. He has a sister [name] who lives in [place]. He has some friends, only able to help if he needs.  CURRENT THERAPY: Surveillance.   INTERIM HISTORY:   Juan Harrison returns for follow up for anal cancer. He presents to the clinic today by himself. He notes he is really tired. He worked early this morning for breakfast shift and will return later today. He notes overall he is doing well. He saw Dr. Marcello Moores a few months ago and he said his exam was fine. He is not sure when he plans to see Dr. Marcello Moores again.     On review of symptoms, pt denies rectal pain or trouble with his BM. He is eating well with no abdominal issues.     MEDICAL HISTORY:  Past Medical History:  Diagnosis Date  . AIDS [facility]) infectious disease--- dr hatcher   dx 2007,  usCD4,  count at 110  . Anal cancer [facility]) oncologist-  dr Rue Tinnel/  dr moody   intial dx 12-10-2016--  Invasive Squamous Cell carcinoma in situ, Stage IIIA ([id number]) --- completed concurrent radiation and chemotherapy (rxt 01-27-2017 chemo 01-24-2017)    . Anal condyloma   . Hepatitis B  surface antigen positive   . History of cancer chemotherapy 12-16-2016 to 01-24-2017  . History of radiation therapy 12-16-2016 to 01-27-2017   55Gy in 30 sessions  . Pulmonary nodule, right    per Chest CT 12-06-2008  . Rectal mass    anal    SURGICAL HISTORY: Past Surgical History:  Procedure Laterality Date  . IR GENERIC HISTORICAL  12/16/2016   IR US GUIDE VASC ACCESS RIGHT 12/16/2016 Arne Cleveland, MD [facility]-INTERV RAD  . IR GENERIC HISTORICAL  12/16/2016   IR FLUORO GUIDE CV LINE RIGHT 12/16/2016 Arne Cleveland, MD [facility]INTER[facility] RAD  . RECTAL BIOPSY N/A 11/29/2016   Procedure: BIOPSY ANAL CANAL MASS;  Surgeon: Leighton Ruff, MD;  Location: [facility];  Service: General;  Laterality: N/A;  . RECTAL EXAM UNDER ANESTHESIA N/A 11/29/2016   Procedure: EXAM UNDER ANESTHESIA;  Surgeon: Leighton Ruff, MD;  Location: [facility];  Service: General;  Laterality: N/A;  . RECTAL EXAM UNDER ANESTHESIA N/A 08/01/2017   Procedure: ANAL EXAM UNDER ANESTHESIA WITH BIOPSY;  Surgeon: Leighton Ruff, MD;  Location: [facility];  Service: General;  Laterality: N/A;  . 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 Marital status: Single    Spouse name: Not on file  . Number of children: Not on file  . Years of education: Not on file  . Highest education level: Not on file  Occupational History  . Not on file  Social Needs  . Financial resource strain: Not on file  . Food insecurity:    Worry: Not on file    Inability: Not on file  . Transportation needs:    Medical: Not on file    Non-medical: Not on file  Tobacco Use  . Smoking status: Current Every Day Smoker    Packs/day: 0.25    Years: 20.00    Pack years: 5.00    Types: Cigarettes    Last attempt to quit: 10/13/2016    Years since quitting: 1.5  . Smokeless tobacco: Never Used  . Tobacco comment: want the patch  Substance and Sexual Activity  . Alcohol use: No    Alcohol/week: 0.6 oz    Types: 1 Standard drinks or equivalent per week    Comment: previously drank alcohol on weekends for 25 years, he quit on 10/14/2016   . Drug use: Yes    Types: Marijuana  . Sexual activity: Not on file  Lifestyle  . Physical activity:    Days per week: Not on file    Minutes per session: Not on file  . Stress: Not on file  Relationships  . Social connections:    Talks on phone: Not on file    Gets together: Not on file    Attends religious service: Not on file    Active member of club or organization: Not on file    Attends meetings of clubs or organizations: Not on file    Relationship status: Not on file  . Intimate partner violence:    Fear of current or ex partner: Not on file    Emotionally abused: Not on file    Physically abused: Not on file    Forced sexual  activity: Not on file  Other Topics Concern  . Not on file  Social History Narrative   Single, lives alone about 10 minutes from [place] in 2 story townhouse  Has one sister, [name] who lives in [place] in past via friends   On disability    FAMILY HISTORY: Family History  Problem Relation Age of Onset  . COPD Mother   . Asthma Brother     ALLERGIES:  has No Known Allergies.  MEDICATIONS:  Current Outpatient Medications  Medication Sig Dispense Refill  . elvitegravir-cobicistat-emtricitabine-tenofovir (GENVOYA) 150-150-200-10 MG TABS tablet Take 1 tablet daily with breakfast by mouth. 30 tablet 5  . nicotine (NICODERM CQ) 21 mg/24hr patch Place 1 patch (21 mg total) onto the skin daily. 42 patch 0  . gabapentin (NEURONTIN) 300 MG capsule Take 1 capsule (300 mg total) by mouth 2 (two) times daily. (Patient not taking: Reported on 04/28/2018) 30 capsule 0  . [START ON 06/09/2018] nicotine (NICODERM CQ - DOSED IN MG/24 HOURS) 14 mg/24hr patch Place 1 patch (14 mg total) onto the skin daily for 42 doses. Remove old patch before applying new patch. 14 patch 0  . [START ON 06/23/2018] nicotine (NICODERM CQ - DOSED IN MG/24 HR) 7 mg/24hr patch Place 1 patch (7 mg total) onto the skin daily for 14 days. Remove old patch before applying new patch. (Patient not taking: Reported on 05/01/2018) 14 patch 0   No current facility-administered medications for this visit.     REVIEW OF SYSTEMS:   Constitutional: Denies fevers, chills or abnormal night sweats (+) Fatigue and tiredness Eyes: Denies blurriness of vision, double vision or watery eyes Ears, nose, mouth, throat, and face: Denies mucositis or sore throat Respiratory: Denies cough, dyspnea or wheezes Cardiovascular: Denies palpitation, chest discomfort or lower extremity swelling Gastrointestinal:  Denies nausea, heartburn or change in bowel habits Skin: Denies abnormal skin rashes  Lymphatics: Denies new lymphadenopathy  or easy bruising Neurological:Denies numbness, tingling or new weaknesses Behavioral/Psych: Mood is stable, no new changes  All other systems were reviewed with the patient and are negative.  PHYSICAL EXAMINATION: ECOG PERFORMANCE STATUS: 0  Vitals:   05/01/18 1009  BP: 108/70  Pulse: 68  Resp: 18  Temp: 97.8 F (36.6 C)  SpO2: 100%   Filed Weights   05/01/18 1009  Weight: 141 lb 6.4 oz (64.1 kg)     GENERAL:alert, no distress and comfortable SKIN: skin irritation I the perirectal area due to previous folliculitis  EYES: normal, conjunctiva are pink and non-injected, sclera clear OROPHARYNX:no exudate, no erythema and lips, buccal mucosa, and tongue normal  NECK: supple, thyroid normal size, non-tender, without nodularity LYMPH:  no palpable lymphadenopathy in the cervical, axillary (+) few small palpable  Lymph nodes LUNGS: clear to auscultation and percussion with normal breathing effort HEART: regular rate & rhythm and no murmurs and no lower extremity edema ABDOMEN:abdomen soft, non-tender and normal bowel sounds, inspection showed no mass at anus, no palpable mass on the rectal exam, she had a moderate pain with the rectal exam Musculoskeletal:no cyanosis of digits and no clubbing PSYCH: alert & oriented x 3 with fluent speech NEURO: no focal motor/sensory deficits Rectal Exam: (+) External and internal rectal exam negative He has internal and external hemorrhoids. No palpable mass.   LABORATORY DATA:  I have reviewed the data as listed CBC Latest Ref Rng & Units 04/29/2018 04/28/2018 01/16/2018  WBC 4.0 - 10.3 K/uL 4.2 4.1 4.0  Hemoglobin 13.0 - 17.1 g/dL 13.0 13.7 14.1  Hematocrit 38.4 - 49.9 % 39.3 40.5 43.1  Platelets 140 - 400 K/uL 207 217 179   CMP Latest Ref Rng & Units 04/29/2018 04/28/2018  01/16/2018  Glucose 70 - 140 mg/dL 144(H) 72 68(L)  BUN 7 - 26 mg/dL 15 12 10   Creatinine 0.70 - 1.30 mg/dL 1.32(H) 1.08 1.33(H)  Sodium 136 - 145 mmol/L 138 137 140    Potassium 3.5 - 5.1 mmol/L 3.8 3.7 4.0  Chloride 98 - 109 mmol/L 104 104 105  CO2 22 - 29 mmol/L 23 27 27   Calcium 8.4 - 10.4 mg/dL 8.9 9.0 9.4  Total Protein 6.4 - 8.3 g/dL 7.0 6.8 7.1  Total Bilirubin 0.2 - 1.2 mg/dL 0.3 0.3 0.6  Alkaline Phos 40 - 150 U/L 77 - 95  AST 5 - 34 U/L 26 17 16   ALT 0 - 55 U/L 30 20 17    PATHOLOGY REPORT  Diagnosis 11/29/2016 Anus, biopsy, anal canal mass - INVASIVE MODERATELY DIFFERENTIATED SQUAMOUS CELL CARCINOMA ASSOCIATED WITH SQUAMOUS CELL CARCINOMA IN SITU.  RADIOGRAPHIC STUDIES: I have personally reviewed the radiological images as listed and agreed with the findings in the report.   PET 04/24/18  IMPRESSION: 1. The 1.0 cm right upper lobe pulmonary nodule has not increased in size compared to the earliest available comparison of 12/06/2016. It has low-grade metabolic activity with maximum SUV of 1.6. Although probably benign, low-grade adenocarcinoma is a less likely differential diagnostic consideration. I would suggest a follow up noncontrast CT of the chest in 1 years time. 2. Other imaging findings of potential clinical significance: Emphysema (ICD10-J43.9). Mucous retention cyst in the right maxillary sinus. Prominent stool throughout the colon favors constipation.   PET 05/01/2017 IMPRESSION: 1. No convincing findings of residual anal neoplasm. No residual discrete anal mass on the CT images. Vague mild anal hypermetabolism (max SUV 3.9), markedly decreased, with mild circumferential anal wall thickening, favored to represent post treatment change. 2. No convincing findings of residual metastatic disease. Solitary mildly hypermetabolic nonenlarged left level 2 neck lymph node is decreased in size and metabolism and is statistically more likely to represent a benign reactive node. No residual hypermetabolism within the previously visualized left para-aortic, left inguinal or internal iliac lymph nodes.  PET  12/13/2016 IMPRESSION: 1. Intensely hypermetabolic 4.0 x 3.3 x 6.3 cm anal mass, compatible with known primary anal carcinoma. 2. Hypermetabolic bilateral internal iliac nodal metastases. 3. Hypermetabolic left inguinal nodal metastasis. 4. Solitary mildly hypermetabolic nonenlarged left para-aortic lymph node, suspicious for nodal metastasis. 5. No additional definite sites of hypermetabolic metastatic disease. 6. Nonspecific nonenlarged mildly hypermetabolic bilateral upper neck lymph nodes, statistically most likely to represent benign reactive nodes. 7. Mild paraseptal emphysema .  ASSESSMENT & PLAN:  47 y.o. African-American male, with history of HIV and AIDS, on genvoya since 03/2016,presents with rectal bleeding and pain.  1.  Anal cancer, invasive squamous cell carcinoma,cT2N1aM0, stage IIIA -I previously reviewed his CT scan findings and anal mass biopsy results in details with patient and his sister. -His previous CT scan showed significantly intact left internal iliac lymph node, highly suspicious for metastasis. No other distant metastasis on CT scan -He has locally advanced anal cancer, I discussed the standard therapy with concurrent chemotherapy and radiation. We usually preserve surgery for residual cancer after chemotherapy and radiation, or recurrent disease. -We previously discussed that his anal cancer will likely be cured by concurrent chemotherapy and radiation, although his HIV and low CD4 count may predicts high risk for chemoradiation, medications, and poor prognosis for his anal cancer. -he now just completed the standard radiation and chemotherapy regiment with mitomycin and 5-FU infusion, received full dose for 2 cycles, second cycle  was postponed for one week due to scheduling issues. -He overall tolerated chemotherapy well, did not have severe cytopenias. -He is recovering well from chemoradiation. Lab results reviewed with patient. -I reviewed his surveillance  PET scan from 05/01/2017, which showed a complete metabolic response to treatment. -due to his previous hypermetabolic retroperitoneal lymph nodes, he is at very high risk for recurrence. -We discussed his PET from 04/24/18 which shows stable lung nodule (likely benign) with no evidence of disease recurrence. Will repeat in 1 year.  -It has been 1.5 years since his diagnosis. I explained recurrence is at higher risk in the first 2-3 years. Will continue to monitor his regularly for at least 5 years.  -His rectal exam was normal. Labs reviewed, his Cr is 1.32 slightly elevated, I advised him to drink more fluids. No clinical concern for recurrence. -F/u in 6 months. I recommend he space out visit with me and Dr. Marcello Moores.    2. RUL Pulmonary  Nodule  -Presents since 12/2016 with no change, likely benign  -Will continue to monitor on yearly scans.   3. HIV/AIDS -His treatment was switched to genvoya in 05/2016, his CD4 has improved since then, but overall still low -continue HIV therapy when on chemo, will check with Dr. [name]  -he is continuing with his treatment    4. Smoking cessation -he reports he has quit smoking in Mid-January 2019 -He notes he relapsed and he again is trying to quit smoking as this can cause lung caner.   Plan -Skin reviewed, no evidence of recurrence, continue surveillance. -lab and f/u in 6 months    All questions were answered. The patient knows to call the clinic with any problems, questions or concerns.  I spent 20 minutes counseling the patient face to face. The total time spent in the appointment was 25 minutes and more than 50% was on counseling.  Oneal Deputy, am acting as scribe for Truitt Merle, MD.   I have reviewed the above documentation for accuracy and completeness, and I agree with the above.     Truitt Merle, MD 05/01/2018

## 2018-05-01 ENCOUNTER — Encounter: Payer: Self-pay | Admitting: Hematology

## 2018-05-01 ENCOUNTER — Telehealth: Payer: Self-pay | Admitting: Hematology

## 2018-05-01 ENCOUNTER — Inpatient Hospital Stay (HOSPITAL_BASED_OUTPATIENT_CLINIC_OR_DEPARTMENT_OTHER): Payer: Medicare Other | Admitting: Hematology

## 2018-05-01 VITALS — BP 108/70 | HR 68 | Temp 97.8°F | Resp 18 | Ht 75.0 in | Wt 141.4 lb

## 2018-05-01 DIAGNOSIS — K625 Hemorrhage of anus and rectum: Secondary | ICD-10-CM

## 2018-05-01 DIAGNOSIS — C775 Secondary and unspecified malignant neoplasm of intrapelvic lymph nodes: Secondary | ICD-10-CM

## 2018-05-01 DIAGNOSIS — C210 Malignant neoplasm of anus, unspecified: Secondary | ICD-10-CM

## 2018-05-01 DIAGNOSIS — Z79899 Other long term (current) drug therapy: Secondary | ICD-10-CM | POA: Diagnosis not present

## 2018-05-01 DIAGNOSIS — Z923 Personal history of irradiation: Secondary | ICD-10-CM | POA: Diagnosis not present

## 2018-05-01 DIAGNOSIS — Z21 Asymptomatic human immunodeficiency virus [HIV] infection status: Secondary | ICD-10-CM | POA: Diagnosis not present

## 2018-05-01 DIAGNOSIS — Z87891 Personal history of nicotine dependence: Secondary | ICD-10-CM

## 2018-05-01 DIAGNOSIS — Z9221 Personal history of antineoplastic chemotherapy: Secondary | ICD-10-CM | POA: Diagnosis not present

## 2018-05-01 DIAGNOSIS — B20 Human immunodeficiency virus [HIV] disease: Secondary | ICD-10-CM

## 2018-05-01 DIAGNOSIS — R911 Solitary pulmonary nodule: Secondary | ICD-10-CM | POA: Diagnosis not present

## 2018-05-01 LAB — CBC
HEMATOCRIT: 40.5 % (ref 38.5–50.0)
HEMOGLOBIN: 13.7 g/dL (ref 13.2–17.1)
MCH: 29.8 pg (ref 27.0–33.0)
MCHC: 33.8 g/dL (ref 32.0–36.0)
MCV: 88.2 fL (ref 80.0–100.0)
MPV: 11 fL (ref 7.5–12.5)
Platelets: 217 10*3/uL (ref 140–400)
RBC: 4.59 10*6/uL (ref 4.20–5.80)
RDW: 13 % (ref 11.0–15.0)
WBC: 4.1 10*3/uL (ref 3.8–10.8)

## 2018-05-01 LAB — COMPREHENSIVE METABOLIC PANEL
AG Ratio: 1.6 (calc) (ref 1.0–2.5)
ALT: 20 U/L (ref 9–46)
AST: 17 U/L (ref 10–40)
Albumin: 4.2 g/dL (ref 3.6–5.1)
Alkaline phosphatase (APISO): 83 U/L (ref 40–115)
BILIRUBIN TOTAL: 0.3 mg/dL (ref 0.2–1.2)
BUN: 12 mg/dL (ref 7–25)
CALCIUM: 9 mg/dL (ref 8.6–10.3)
CO2: 27 mmol/L (ref 20–32)
Chloride: 104 mmol/L (ref 98–110)
Creat: 1.08 mg/dL (ref 0.60–1.35)
GLUCOSE: 72 mg/dL (ref 65–99)
Globulin: 2.6 g/dL (calc) (ref 1.9–3.7)
Potassium: 3.7 mmol/L (ref 3.5–5.3)
SODIUM: 137 mmol/L (ref 135–146)
TOTAL PROTEIN: 6.8 g/dL (ref 6.1–8.1)

## 2018-05-01 LAB — LIPID PANEL
Cholesterol: 128 mg/dL (ref <200)
HDL: 49 mg/dL (ref >40)
LDL Cholesterol (Calc): 65 mg/dL (calc)
Non-HDL Cholesterol (Calc): 79 mg/dL (calc) (ref <130)
TRIGLYCERIDES: 68 mg/dL (ref <150)
Total CHOL/HDL Ratio: 2.6 (calc) (ref <5.0)

## 2018-05-01 LAB — RPR: RPR: NONREACTIVE

## 2018-05-01 NOTE — Telephone Encounter (Signed)
Appointments scheduled Calendar/Letter mailed to patient per 5/31 los

## 2018-05-12 ENCOUNTER — Telehealth: Payer: Self-pay

## 2018-05-12 NOTE — Telephone Encounter (Signed)
Pharmacist called today for clarification on pt's nicotine patch. Directions ask for pt to take once patch for for 42 doses, but prescription was only for 14 patches. Pharmacist would like to know if the prescription was supposed to disp. 42 patches or if the prescription was only for 14 days. Pharmacist would like a call back at [phone] with clarification on this medication.  [name]

## 2018-05-13 ENCOUNTER — Other Ambulatory Visit: Payer: Self-pay | Admitting: Infectious Diseases

## 2018-05-13 DIAGNOSIS — Z720 Tobacco use: Secondary | ICD-10-CM

## 2018-06-16 IMAGING — XA IR FLUORO GUIDE CV LINE*R*
1 series · 1 of 1 positions shown · non-contrast
Comparison: none

CLINICAL DATA: Anal carcinoma, needs durable venous access for
chemotherapy regimen

EXAM:
PICC PLACEMENT WITH ULTRASOUND AND FLUOROSCOPY
FLUOROSCOPY TIME:  6 sec (2 mGy)
TECHNIQUE: After written informed consent was obtained, patient was placed in
the supine position on angiographic table. Patency of the right
basilic vein was confirmed with ultrasound with image documentation.
An appropriate skin site was determined. Skin site was marked.
Region was prepped using maximum barrier technique including cap and
mask, sterile gown, sterile gloves, large sterile sheet, and
Chlorhexidine as cutaneous antisepsis. The region was infiltrated
locally with 1% lidocaine. Under real-time ultrasound guidance, the
right basilic vein was accessed with a 21 gauge micropuncture
needle; the needle tip within the vein was confirmed with ultrasound
image documentation. Needle exchanged over a 018 guidewire for a
peel-away sheath, through which a 5-French single-lumen power
injectable PICC trimmed to 46cm was advanced, positioned with its
tip near the cavoatrial junction. Spot chest radiograph confirms
appropriate catheter position. Catheter was flushed per protocol and
secured externally. The patient tolerated procedure well.
COMPLICATIONS:
COMPLICATIONS
none

[Series 300: line placements · 1 of 1 slices shown]
[im 1/1]
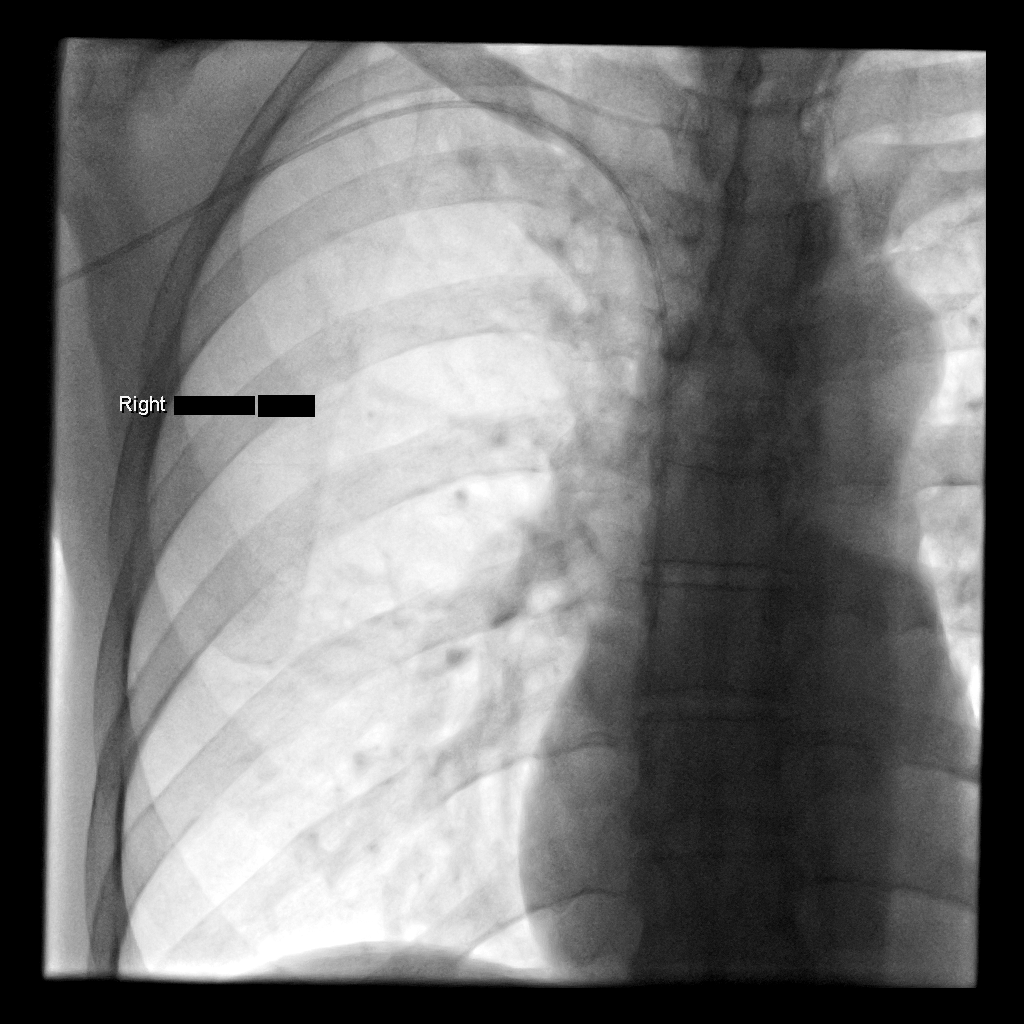

[1 of 1 positions shown; findings below may reference images not displayed]

IMPRESSION: 1. Technically successful five French single lumen power injectable
PICC placement

## 2018-06-23 MED FILL — GENVOYA TABLET: 150-150-200 | 30 days supply | Qty: 30 | Fill #4

## 2018-08-13 MED FILL — GENVOYA TABLET: 150-150-200 | 30 days supply | Qty: 30 | Fill #5

## 2018-08-24 ENCOUNTER — Other Ambulatory Visit: Payer: Self-pay | Admitting: Pharmacist

## 2018-08-24 DIAGNOSIS — B20 Human immunodeficiency virus [HIV] disease: Secondary | ICD-10-CM

## 2018-08-24 MED ORDER — ELVITEG-COBIC-EMTRICIT-TENOFAF 150-150-200-10 MG PO TABS: 1.0000 | ORAL_TABLET | Freq: Every day | ORAL | 5 refills | Status: DC

## 2018-09-15 MED FILL — GENVOYA TABLET: 150-150-200 | 30 days supply | Qty: 30 | Fill #0

## 2018-09-23 ENCOUNTER — Encounter (HOSPITAL_COMMUNITY): Payer: Self-pay | Admitting: Emergency Medicine

## 2018-09-23 ENCOUNTER — Emergency Department (HOSPITAL_COMMUNITY)
Admission: EM | Admit: 2018-09-23 | Discharge: 2018-09-23 | Disposition: A | Discharge status: Home / Self Care | Payer: Medicare Other | Attending: Emergency Medicine | Admitting: Emergency Medicine

## 2018-09-23 DIAGNOSIS — F17210 Nicotine dependence, cigarettes, uncomplicated: Secondary | ICD-10-CM | POA: Diagnosis not present

## 2018-09-23 DIAGNOSIS — N390 Urinary tract infection, site not specified: Secondary | ICD-10-CM | POA: Insufficient documentation

## 2018-09-23 DIAGNOSIS — Z85040 Personal history of malignant carcinoid tumor of rectum: Secondary | ICD-10-CM | POA: Diagnosis not present

## 2018-09-23 DIAGNOSIS — R1110 Vomiting, unspecified: Secondary | ICD-10-CM | POA: Diagnosis present

## 2018-09-23 DIAGNOSIS — Z79899 Other long term (current) drug therapy: Secondary | ICD-10-CM | POA: Insufficient documentation

## 2018-09-23 DIAGNOSIS — N182 Chronic kidney disease, stage 2 (mild): Secondary | ICD-10-CM | POA: Insufficient documentation

## 2018-09-23 DIAGNOSIS — B20 Human immunodeficiency virus [HIV] disease: Secondary | ICD-10-CM | POA: Insufficient documentation

## 2018-09-23 LAB — CBC WITH DIFFERENTIAL/PLATELET
Abs Immature Granulocytes: 0.02 10*3/uL (ref 0.00–0.07)
BASOS ABS: 0 10*3/uL (ref 0.0–0.1)
BASOS PCT: 0 %
EOS PCT: 1 %
Eosinophils Absolute: 0.1 10*3/uL (ref 0.0–0.5)
HCT: 46.5 % (ref 39.0–52.0)
Hemoglobin: 14.7 g/dL (ref 13.0–17.0)
Immature Granulocytes: 0 %
LYMPHS PCT: 18 %
Lymphs Abs: 1.3 10*3/uL (ref 0.7–4.0)
MCH: 29.1 pg (ref 26.0–34.0)
MCHC: 31.6 g/dL (ref 30.0–36.0)
MCV: 92.1 fL (ref 80.0–100.0)
Monocytes Absolute: 0.7 10*3/uL (ref 0.1–1.0)
Monocytes Relative: 9 %
NRBC: 0 % (ref 0.0–0.2)
Neutro Abs: 5.4 10*3/uL (ref 1.7–7.7)
Neutrophils Relative %: 72 %
PLATELETS: 204 10*3/uL (ref 150–400)
RBC: 5.05 MIL/uL (ref 4.22–5.81)
RDW: 13.6 % (ref 11.5–15.5)
WBC: 7.5 10*3/uL (ref 4.0–10.5)

## 2018-09-23 LAB — COMPREHENSIVE METABOLIC PANEL
ALBUMIN: 3.8 g/dL (ref 3.5–5.0)
ALT: 30 U/L (ref 0–44)
ANION GAP: 10 (ref 5–15)
AST: 21 U/L (ref 15–41)
Alkaline Phosphatase: 73 U/L (ref 38–126)
BUN: 12 mg/dL (ref 6–20)
CO2: 25 mmol/L (ref 22–32)
Calcium: 9.4 mg/dL (ref 8.9–10.3)
Chloride: 103 mmol/L (ref 98–111)
Creatinine, Ser: 1.14 mg/dL (ref 0.61–1.24)
GFR calc non Af Amer: >60 mL/min (ref >60)
GLUCOSE: 112 mg/dL — AB (ref 70–99)
POTASSIUM: 3.6 mmol/L (ref 3.5–5.1)
Sodium: 138 mmol/L (ref 135–145)
TOTAL PROTEIN: 7 g/dL (ref 6.5–8.1)
Total Bilirubin: 0.9 mg/dL (ref 0.3–1.2)

## 2018-09-23 LAB — URINALYSIS, ROUTINE W REFLEX MICROSCOPIC
BILIRUBIN URINE: NEGATIVE
Glucose, UA: NEGATIVE mg/dL
HGB URINE DIPSTICK: NEGATIVE
Ketones, ur: 20 mg/dL — AB
NITRITE: NEGATIVE
Protein, ur: 30 mg/dL — AB
SPECIFIC GRAVITY, URINE: 1.03 (ref 1.005–1.030)
pH: 6 (ref 5.0–8.0)

## 2018-09-23 LAB — LIPASE, BLOOD: Lipase: 24 U/L (ref 11–51)

## 2018-09-23 MED ORDER — ONDANSETRON HCL 4 MG/2ML IJ SOLN
4.0000 mg | Freq: Once | INTRAMUSCULAR | Status: AC
  Administered 2018-09-23: 4 mg via INTRAVENOUS
  Filled 2018-09-23: qty 2

## 2018-09-23 MED ORDER — SODIUM CHLORIDE 0.9 % IV BOLUS
1000.0000 mL | Freq: Once | INTRAVENOUS | Status: AC
  Administered 2018-09-23: 1000 mL via INTRAVENOUS

## 2018-09-23 MED ORDER — ONDANSETRON 4 MG PO TBDP: 4.0000 mg | ORAL_TABLET | Freq: Three times a day (TID) | ORAL | 0 refills | Status: DC | PRN

## 2018-09-23 MED FILL — ONDANSETRON ODT 4 MG TABLET: 4 | 6 days supply | Qty: 20 | Fill #0

## 2018-09-23 NOTE — ED Notes (Signed)
E signature pad not available. Pt agreeable to discharge and understood his discharge instructions

## 2018-09-23 NOTE — ED Provider Notes (Signed)
[place] EMERGENCY DEPARTMENT Provider Note   CSN: [id number] Arrival date & time: 09/23/18  4008     History   Chief Complaint Chief Complaint  Patient presents with  . Emesis    HPI Juan Harrison is a 47 y.o. male.  The history is provided by the patient. No language interpreter was used.  Emesis   This is a new problem. Episode onset: 3 days. The problem occurs 2 to 4 times per day. The problem has not changed since onset.There has been no fever. Associated symptoms include abdominal pain and chills. Risk factors include suspect food intake.  Pt reports he had some chicken to eat on Monday that he thinks was bad. Pt reports vomiting since.  Pt reports he has HIV. Pt also has a histroy of anal cancer   Past Medical History:  Diagnosis Date  . AIDS [facility]) infectious disease--- dr hatcher   dx 2007,  usCD4,  count at 110  . Anal cancer [facility]) oncologist-  dr feng/  dr moody   intial dx 12-10-2016--  Invasive Squamous Cell carcinoma in situ, Stage IIIA ([id number]) --- completed concurrent radiation and chemotherapy (rxt 01-27-2017 chemo 01-24-2017)    . Anal condyloma   . Hepatitis B surface antigen positive   . History of cancer chemotherapy 12-16-2016 to 01-24-2017  . History of radiation therapy 12-16-2016 to 01-27-2017   55Gy in 30 sessions  . Pulmonary nodule, right    per Chest CT 12-06-2008  . Rectal mass    anal    Patient Active Problem List   Diagnosis Date Noted  . CKD (chronic kidney disease) stage 2, GFR 60-89 ml/min 04/28/2018  . Herpes genitalis in men 02/12/2018  . Tobacco use 10/13/2017  . PICC (peripherally inserted central catheter) in place 01/07/2017  . Anal cancer ([place]) 12/10/2016  . Hepatitis B immune 10/29/2016  . AIDS ([place]) 12/02/2005    Past Surgical History:  Procedure Laterality Date  . IR GENERIC HISTORICAL  12/16/2016   IR US GUIDE VASC ACCESS RIGHT 12/16/2016 Arne Cleveland, MD [facility]  . IR GENERIC  HISTORICAL  12/16/2016   IR FLUORO GUIDE CV LINE RIGHT 12/16/2016 Arne Cleveland, MD [facility]  . RECTAL BIOPSY N/A 11/29/2016   Procedure: BIOPSY ANAL CANAL MASS;  Surgeon: Leighton Ruff, MD;  Location: [place];  Service: General;  Laterality: N/A;  . RECTAL EXAM UNDER ANESTHESIA N/A 11/29/2016   Procedure: EXAM UNDER ANESTHESIA;  Surgeon: Leighton Ruff, MD;  Location: [place];  Service: General;  Laterality: N/A;  . RECTAL EXAM UNDER ANESTHESIA N/A 08/01/2017   Procedure: ANAL EXAM UNDER ANESTHESIA WITH BIOPSY;  Surgeon: Leighton Ruff, MD;  Location: [place];  Service: General;  Laterality: N/A;  . 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 Authorizing Provider  elvitegravir-cobicistat-emtricitabine-tenofovir (GENVOYA) 150-150-200-10 MG TABS tablet Take 1 tablet by mouth daily with breakfast. 08/24/18  Yes Kuppelweiser, Cassie L, RPH-CPP  gabapentin (NEURONTIN) 300 MG capsule Take 1 capsule (300 mg total) by mouth 2 (two) times daily. Patient not taking: Reported on 04/28/2018 02/12/18   Coolidge Callas, NP  nicotine (NICODERM CQ - DOSED IN MG/24 HOURS) 14 mg/24hr patch Place 1 patch (14 mg total) onto the skin daily for 42 doses. Remove old patch before applying new patch. Patient  not taking: Reported on 09/23/2018 06/09/18 09/23/26  Campbell Riches, MD  nicotine (NICODERM CQ - DOSED IN MG/24 HR) 7 mg/24hr patch Place 1 patch (7 mg total) onto the skin daily for 14 days. Remove old patch before applying new patch. Patient not taking: Reported on 09/23/2018 06/23/18 09/23/26  Campbell Riches, MD  nicotine (NICODERM CQ) 21 mg/24hr patch Place 1 patch (21 mg total) onto the skin daily. Patient not taking: Reported on 09/23/2018 04/28/18 09/23/26  Campbell Riches, MD    Family History Family History  Problem Relation Age of  Onset  . COPD Mother   . Asthma Brother     Social History Social History   Tobacco Use  . Smoking status: Current Every Day Smoker    Packs/day: 0.25    Years: 20.00    Pack years: 5.00    Types: Cigarettes    Last attempt to quit: 10/13/2016    Years since quitting: 1.9  . Smokeless tobacco: Never Used  . Tobacco comment: want the patch  Substance Use Topics  . Alcohol use: No    Alcohol/week: 1.0 standard drinks    Types: 1 Standard drinks or equivalent per week    Comment: previously drank alcohol on weekends for 25 years, he quit on 10/14/2016   . Drug use: Yes    Types: Marijuana     Allergies   Patient has no known allergies.   Review of Systems Review of Systems  Constitutional: Positive for chills.  Gastrointestinal: Positive for abdominal pain and vomiting.  All other systems reviewed and are negative.    Physical Exam Updated Vital Signs BP 107/78   Pulse (!) 52   Temp (!) 97.4 F (36.3 C) (Oral)   Resp 16   SpO2 100%   Physical Exam  Constitutional: He appears well-developed and well-nourished.  HENT:  Head: Normocephalic and atraumatic.  Right Ear: External ear normal.  Nose: Nose normal.  Mouth/Throat: Oropharynx is clear and moist.  Eyes: Conjunctivae are normal.  Neck: Normal range of motion. Neck supple.  Cardiovascular: Normal rate and regular rhythm.  No murmur heard. Pulmonary/Chest: Effort normal and breath sounds normal. No respiratory distress.  Abdominal: Soft. There is no tenderness.  Musculoskeletal: He exhibits no edema.  Neurological: He is alert.  Skin: Skin is warm and dry.  Psychiatric: He has a normal mood and affect.  Nursing note and vitals reviewed.    ED Treatments / Results  Labs (all labs ordered are listed, but only abnormal results are displayed) Labs Reviewed  COMPREHENSIVE METABOLIC PANEL - Abnormal; Notable for the following components:      Result Value   Glucose, Bld 112 (*)    All other  components within normal limits  CBC WITH DIFFERENTIAL/PLATELET  LIPASE, BLOOD  URINALYSIS, ROUTINE W REFLEX MICROSCOPIC    EKG None  Radiology No results found.  Procedures Procedures (including critical care time)  Medications Ordered in ED Medications  sodium chloride 0.9 % bolus 1,000 mL (0 mLs Intravenous Stopped 09/23/18 0827)  ondansetron (ZOFRAN) injection 4 mg (4 mg Intravenous Given 09/23/18 2025)     Initial Impression / Assessment and Plan / ED Course  I have reviewed the triage vital signs and the nursing notes.  Pertinent labs & imaging results that were available during my care of the patient were reviewed by me and considered in my medical decision making (see chart for details).     Pt able to tolerate fluids without vomiting,  Pt given rx for doxycycline.  Pt advised to follo up with his MD for recheck,  Final Clinical Impressions(s) / ED Diagnoses   Final diagnoses:  Urinary tract infection without hematuria, site unspecified    ED Discharge Orders         Ordered    ondansetron (ZOFRAN ODT) 4 MG disintegrating tablet  Every 8 hours PRN     09/23/18 1037         An After Visit Summary was printed and given to the patient.    Fransico Meadow, [place] 09/23/18 1038    Daleen Bo, MD 09/23/18 1757

## 2018-09-23 NOTE — ED Triage Notes (Signed)
Pt reports vomiting "water" since Monday, denies diarrhea.

## 2018-09-23 NOTE — Discharge Instructions (Addendum)
Return if any problems.  See your Physician for recheck  °

## 2018-09-25 ENCOUNTER — Ambulatory Visit (INDEPENDENT_AMBULATORY_CARE_PROVIDER_SITE_OTHER): Payer: Medicare Other

## 2018-09-25 ENCOUNTER — Ambulatory Visit (HOSPITAL_COMMUNITY)
Admission: EM | Admit: 2018-09-25 | Discharge: 2018-09-25 | Disposition: A | Discharge status: Home / Self Care | Payer: Medicare Other | Attending: Family Medicine | Admitting: Family Medicine

## 2018-09-25 ENCOUNTER — Telehealth: Payer: Self-pay

## 2018-09-25 ENCOUNTER — Encounter (HOSPITAL_COMMUNITY): Payer: Self-pay

## 2018-09-25 ENCOUNTER — Other Ambulatory Visit: Payer: Self-pay

## 2018-09-25 DIAGNOSIS — N390 Urinary tract infection, site not specified: Secondary | ICD-10-CM | POA: Diagnosis not present

## 2018-09-25 DIAGNOSIS — Z85048 Personal history of other malignant neoplasm of rectum, rectosigmoid junction, and anus: Secondary | ICD-10-CM | POA: Diagnosis not present

## 2018-09-25 DIAGNOSIS — K5900 Constipation, unspecified: Secondary | ICD-10-CM | POA: Diagnosis not present

## 2018-09-25 DIAGNOSIS — R109 Unspecified abdominal pain: Secondary | ICD-10-CM | POA: Diagnosis not present

## 2018-09-25 MED ORDER — ONDANSETRON HCL 4 MG/2ML IJ SOLN
INTRAMUSCULAR | Status: AC
  Filled 2018-09-25: qty 2

## 2018-09-25 MED ORDER — ONDANSETRON HCL 4 MG/2ML IJ SOLN
4.0000 mg | Freq: Once | INTRAMUSCULAR | Status: AC
  Administered 2018-09-25: 4 mg via INTRAMUSCULAR

## 2018-09-25 MED ORDER — MAGNESIUM CITRATE PO SOLN: 1.0000 | Freq: Once | ORAL | 1 refills | Status: AC

## 2018-09-25 NOTE — ED Triage Notes (Signed)
Pt states he's vomiting and he's constipated and has abdominal pain. X 4 days.

## 2018-09-25 NOTE — ED Provider Notes (Signed)
[place]    CSN: [id number] Arrival date & time: 09/25/18  2703     History   Chief Complaint Chief Complaint  Patient presents with  . Abdominal Pain  . Urinary Tract Infection    HPI Juan Harrison is a 47 y.o. male.   She is a 47 year old male past medical history of AIDS, anal cancer, hepatitis B, CKD, herpes, tobacco abuse.  He presents with generalized abdominal discomfort that is worsening over the past 6 days.  He was seen in the ER 2 days ago and told he had a urinary tract infection.  Reports he has been taking Zofran for nausea vomiting.  He reports that is the only medicine he is been taking.  He has not had a bowel movement since last Saturday.  He has been eating small meals but vomiting after most meals.  He is even been vomiting up water.  He denies any fever, chills, dysuria, flank pain, back pain.   ROS per HPI    Abdominal Pain  Urinary Tract Infection  Associated symptoms include abdominal pain.    Past Medical History:  Diagnosis Date  . AIDS [facility]) infectious disease--- dr hatcher   dx 2007,  usCD4,  count at 110  . Anal cancer [facility]) oncologist-  dr feng/  dr moody   intial dx 12-10-2016--  Invasive Squamous Cell carcinoma in situ, Stage IIIA ([id number]) --- completed concurrent radiation and chemotherapy (rxt 01-27-2017 chemo 01-24-2017)    . Anal condyloma   . Hepatitis B surface antigen positive   . History of cancer chemotherapy 12-16-2016 to 01-24-2017  . History of radiation therapy 12-16-2016 to 01-27-2017   55Gy in 30 sessions  . Pulmonary nodule, right    per Chest CT 12-06-2008  . Rectal mass    anal    Patient Active Problem List   Diagnosis Date Noted  . CKD (chronic kidney disease) stage 2, GFR 60-89 ml/min 04/28/2018  . Herpes genitalis in men 02/12/2018  . Tobacco use 10/13/2017  . PICC (peripherally inserted central catheter) in place 01/07/2017  . Anal cancer ([place]) 12/10/2016  . Hepatitis B immune  10/29/2016  . AIDS ([place]) 12/02/2005    Past Surgical History:  Procedure Laterality Date  . IR GENERIC HISTORICAL  12/16/2016   IR US GUIDE VASC ACCESS RIGHT 12/16/2016 Arne Cleveland, MD [facility]  . IR GENERIC HISTORICAL  12/16/2016   IR FLUORO GUIDE CV LINE RIGHT 12/16/2016 Arne Cleveland, MD [facility]  . RECTAL BIOPSY N/A 11/29/2016   Procedure: BIOPSY ANAL CANAL MASS;  Surgeon: Leighton Ruff, MD;  Location: [place];  Service: General;  Laterality: N/A;  . RECTAL EXAM UNDER ANESTHESIA N/A 11/29/2016   Procedure: EXAM UNDER ANESTHESIA;  Surgeon: Leighton Ruff, MD;  Location: [place];  Service: General;  Laterality: N/A;  . RECTAL EXAM UNDER ANESTHESIA N/A 08/01/2017   Procedure: ANAL EXAM UNDER ANESTHESIA WITH BIOPSY;  Surgeon: Leighton Ruff, MD;  Location: [place];  Service: General;  Laterality: N/A;  . 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 Authorizing Provider  elvitegravir-cobicistat-emtricitabine-tenofovir (GENVOYA) 150-150-200-10 MG TABS tablet Take 1 tablet by mouth daily with breakfast. 08/24/18   Kuppelweiser, Cassie L, RPH-CPP  magnesium citrate SOLN Take 296 mLs (1 Bottle total) by mouth once for 1 dose. 09/25/18 09/25/18  Concepcion Kirkpatrick A, NP  nicotine (NICODERM CQ) 21 mg/24hr patch Place 1 patch (21 mg total) onto the skin daily. Patient not taking: Reported on 09/23/2018 04/28/18 09/23/26  Campbell Riches, MD  ondansetron (ZOFRAN ODT) 4 MG disintegrating tablet Take 1 tablet (4 mg total) by mouth every 8 (eight) hours as needed for nausea or vomiting. 09/23/18   Fransico Meadow, PA-C    Family History Family History  Problem Relation Age of Onset  . COPD Mother   . Asthma Brother     Social History Social History   Tobacco Use  . Smoking status: Current Every Day Smoker    Packs/day: 0.25     Years: 20.00    Pack years: 5.00    Types: Cigarettes    Last attempt to quit: 10/13/2016    Years since quitting: 1.9  . Smokeless tobacco: Never Used  . Tobacco comment: want the patch  Substance Use Topics  . Alcohol use: No    Alcohol/week: 1.0 standard drinks    Types: 1 Standard drinks or equivalent per week    Comment: previously drank alcohol on weekends for 25 years, he quit on 10/14/2016   . Drug use: Yes    Types: Marijuana     Allergies   Patient has no known allergies.   Review of Systems Review of Systems  Gastrointestinal: Positive for abdominal pain.     Physical Exam Triage Vital Signs ED Triage Vitals  Enc Vitals Group     BP 09/25/18 0925 118/63     Pulse Rate 09/25/18 0925 65     Resp 09/25/18 0925 18     Temp 09/25/18 0925 (!) 97.5 F (36.4 C)     Temp Source 09/25/18 0925 Oral     SpO2 09/25/18 0925 100 %     Weight 09/25/18 0926 135 lb (61.2 kg)     Height --      Head Circumference --      Peak Flow --      Pain Score 09/25/18 0926 10     Pain Loc --      Pain Edu? --      Excl. in [place]? --    No data found.  Updated Vital Signs BP 118/63 (BP Location: Left Arm)   Pulse 65   Temp (!) 97.5 F (36.4 C) (Oral)   Resp 18   Wt 135 lb (61.2 kg)   SpO2 100%   BMI 16.87 kg/m   Visual Acuity Right Eye Distance:   Left Eye Distance:   Bilateral Distance:    Right Eye Near:   Left Eye Near:    Bilateral Near:     Physical Exam  Constitutional: He appears well-developed and well-nourished.  Appears in pain, non toxic   HENT:  Head: Normocephalic.  Pulmonary/Chest: Effort normal.  Abdominal: Soft. Normal appearance and bowel sounds are normal. There is no hepatosplenomegaly, splenomegaly or hepatomegaly. There is no rigidity, no rebound, no guarding, no CVA tenderness, no tenderness at McBurney's point and negative Murphy's sign.  Very thin, moderate distension and tenderness to the left mid abdomen. No rebound or masses.     Neurological: He is alert.  Skin: Skin is warm and dry.  Psychiatric: He has a normal mood and affect.  Nursing note and vitals reviewed.    UC Treatments / Results  Labs (all labs ordered are listed, but only abnormal results are displayed) Labs Reviewed - No data to display  EKG None  Radiology Dg Abd 2 Views  Result Date: 09/25/2018 CLINICAL DATA:  Rule out bowel obstruction. Urinary tract infection. Abdominal pain. History of anal cancer. EXAM: ABDOMEN - 2 VIEW COMPARISON:  PET-CT 04/24/2018 FINDINGS: Negative for free air. Nonobstructive bowel gas pattern. Moderate stool burden in the abdomen. Lung bases are clear. Calcification in the right abdomen appears to be related to the right anterior costal cartilage based on the previous PET-CT. IMPRESSION: Nonobstructive bowel gas pattern.  Moderate stool burden. Electronically Signed   By: Markus Daft M.D.   On: 09/25/2018 11:05    Procedures Procedures (including critical care time)  Medications Ordered in UC Medications  ondansetron (ZOFRAN) injection 4 mg (4 mg Intramuscular Given 09/25/18 1135)    Initial Impression / Assessment and Plan / UC Course  I have reviewed the triage vital signs and the nursing notes.  Pertinent labs & imaging results that were available during my care of the patient were reviewed by me and considered in my medical decision making (see chart for details).    VSS non toxic or ill appearing.  X ray revealed  moderate stool burden.  Magnesium citrate to treat. Zofran given IM here in the clinic for nausea and vomiting.  For continued or worsening symptoms go to the ER.  Final Clinical Impressions(s) / UC Diagnoses   Final diagnoses:  Constipation, unspecified constipation type     Discharge Instructions     Your x-ray showed a moderate amount of stool We will treat this with magnesium citrate If you develop worsening symptoms please go to the ER.     ED Prescriptions    Medication  Sig Dispense Auth. Provider   magnesium citrate SOLN Take 296 mLs (1 Bottle total) by mouth once for 1 dose. 195 mL Loura Halt A, NP     Controlled Substance Prescriptions [place] Controlled Substance Registry consulted? Not Applicable   Orvan July, NP 09/25/18 1156

## 2018-09-25 NOTE — Discharge Instructions (Signed)
Your x-ray showed a moderate amount of stool We will treat this with magnesium citrate If you develop worsening symptoms please go to the ER.

## 2018-09-25 NOTE — Telephone Encounter (Signed)
Patient walked into clinic today stating he does not feel well. Was seen at the hospital on 10/23 for UTI. Patient was given Ondansetron 4 mg tabs for nausea. Patient is complaining of stomach pain, feels like his stomach is in a knot. Patient states the pain is off and on, and is uncomfortable. Advised patient to go to Urgent care or Ed for evaluation since we do not have any appointments today. Patient verbalized understanding and will go to Urgent Care. [place]

## 2018-10-05 ENCOUNTER — Telehealth: Payer: Self-pay | Admitting: Hematology

## 2018-10-05 NOTE — Telephone Encounter (Signed)
YF PAL 11/29 - spoke with patient re lab/fu 12/2. Per patient request moved appointments to 12/6.

## 2018-10-16 MED FILL — GENVOYA TABLET: 150-150-200 | 30 days supply | Qty: 30 | Fill #1

## 2018-10-30 ENCOUNTER — Other Ambulatory Visit: Payer: Medicare Other

## 2018-10-30 ENCOUNTER — Ambulatory Visit: Payer: Medicare Other | Admitting: Hematology

## 2018-11-02 ENCOUNTER — Ambulatory Visit: Payer: Medicare Other | Admitting: Hematology

## 2018-11-02 ENCOUNTER — Other Ambulatory Visit: Payer: Medicare Other

## 2018-11-06 ENCOUNTER — Inpatient Hospital Stay: Payer: Medicare Other | Admitting: Hematology

## 2018-11-06 ENCOUNTER — Inpatient Hospital Stay: Payer: Medicare Other | Attending: Hematology

## 2018-11-16 MED FILL — GENVOYA TABLET: 150-150-200 | 30 days supply | Qty: 30 | Fill #2

## 2018-11-30 ENCOUNTER — Emergency Department (HOSPITAL_COMMUNITY): Payer: Medicare Other

## 2018-11-30 ENCOUNTER — Other Ambulatory Visit: Payer: Self-pay

## 2018-11-30 ENCOUNTER — Inpatient Hospital Stay (HOSPITAL_COMMUNITY)
Admission: EM | Admit: 2018-11-30 | Discharge: 2018-12-02 | DRG: 976 | Disposition: A | Discharge status: Home / Self Care | Payer: Medicare Other | Attending: Internal Medicine | Admitting: Internal Medicine

## 2018-11-30 ENCOUNTER — Encounter (HOSPITAL_COMMUNITY): Payer: Self-pay

## 2018-11-30 DIAGNOSIS — R079 Chest pain, unspecified: Secondary | ICD-10-CM | POA: Diagnosis not present

## 2018-11-30 DIAGNOSIS — R0602 Shortness of breath: Secondary | ICD-10-CM | POA: Diagnosis not present

## 2018-11-30 DIAGNOSIS — Z9221 Personal history of antineoplastic chemotherapy: Secondary | ICD-10-CM | POA: Diagnosis not present

## 2018-11-30 DIAGNOSIS — J440 Chronic obstructive pulmonary disease with acute lower respiratory infection: Secondary | ICD-10-CM | POA: Diagnosis present

## 2018-11-30 DIAGNOSIS — Z923 Personal history of irradiation: Secondary | ICD-10-CM | POA: Diagnosis not present

## 2018-11-30 DIAGNOSIS — J1008 Influenza due to other identified influenza virus with other specified pneumonia: Principal | ICD-10-CM | POA: Diagnosis present

## 2018-11-30 DIAGNOSIS — Z85118 Personal history of other malignant neoplasm of bronchus and lung: Secondary | ICD-10-CM

## 2018-11-30 DIAGNOSIS — Z85048 Personal history of other malignant neoplasm of rectum, rectosigmoid junction, and anus: Secondary | ICD-10-CM | POA: Diagnosis not present

## 2018-11-30 DIAGNOSIS — D649 Anemia, unspecified: Secondary | ICD-10-CM | POA: Diagnosis present

## 2018-11-30 DIAGNOSIS — F17210 Nicotine dependence, cigarettes, uncomplicated: Secondary | ICD-10-CM | POA: Diagnosis present

## 2018-11-30 DIAGNOSIS — J189 Pneumonia, unspecified organism: Secondary | ICD-10-CM | POA: Diagnosis not present

## 2018-11-30 DIAGNOSIS — Z825 Family history of asthma and other chronic lower respiratory diseases: Secondary | ICD-10-CM

## 2018-11-30 DIAGNOSIS — J101 Influenza due to other identified influenza virus with other respiratory manifestations: Secondary | ICD-10-CM | POA: Diagnosis not present

## 2018-11-30 DIAGNOSIS — B20 Human immunodeficiency virus [HIV] disease: Secondary | ICD-10-CM | POA: Diagnosis not present

## 2018-11-30 DIAGNOSIS — J181 Lobar pneumonia, unspecified organism: Secondary | ICD-10-CM | POA: Diagnosis not present

## 2018-11-30 DIAGNOSIS — R739 Hyperglycemia, unspecified: Secondary | ICD-10-CM | POA: Diagnosis not present

## 2018-11-30 LAB — CBC WITH DIFFERENTIAL/PLATELET
ABS IMMATURE GRANULOCYTES: 0 10*3/uL (ref 0.00–0.07)
BASOS PCT: 0 %
Band Neutrophils: 16 %
Basophils Absolute: 0 10*3/uL (ref 0.0–0.1)
EOS ABS: 0 10*3/uL (ref 0.0–0.5)
EOS PCT: 0 %
HCT: 38.3 % — ABNORMAL LOW (ref 39.0–52.0)
HEMOGLOBIN: 12.5 g/dL — AB (ref 13.0–17.0)
LYMPHS PCT: 7 %
Lymphs Abs: 0.5 10*3/uL — ABNORMAL LOW (ref 0.7–4.0)
MCH: 29.6 pg (ref 26.0–34.0)
MCHC: 32.6 g/dL (ref 30.0–36.0)
MCV: 90.5 fL (ref 80.0–100.0)
MONO ABS: 0.4 10*3/uL (ref 0.1–1.0)
Monocytes Relative: 6 %
NEUTROS PCT: 71 %
NRBC: 0 % (ref 0.0–0.2)
Neutro Abs: 6.2 10*3/uL (ref 1.7–7.7)
PLATELETS: 147 10*3/uL — AB (ref 150–400)
RBC: 4.23 MIL/uL (ref 4.22–5.81)
RDW: 13.6 % (ref 11.5–15.5)
WBC: 7.1 10*3/uL (ref 4.0–10.5)
nRBC: 0 /100 WBC

## 2018-11-30 LAB — INFLUENZA PANEL BY PCR (TYPE A & B)
Influenza A By PCR: NEGATIVE
Influenza B By PCR: POSITIVE — AB

## 2018-11-30 LAB — COMPREHENSIVE METABOLIC PANEL
ALT: 33 U/L (ref 0–44)
AST: 24 U/L (ref 15–41)
Albumin: 3.2 g/dL — ABNORMAL LOW (ref 3.5–5.0)
Alkaline Phosphatase: 56 U/L (ref 38–126)
Anion gap: 13 (ref 5–15)
BUN: 13 mg/dL (ref 6–20)
CALCIUM: 8.2 mg/dL — AB (ref 8.9–10.3)
CO2: 20 mmol/L — ABNORMAL LOW (ref 22–32)
CREATININE: 1.12 mg/dL (ref 0.61–1.24)
Chloride: 102 mmol/L (ref 98–111)
GFR calc non Af Amer: >60 mL/min (ref >60)
Glucose, Bld: 129 mg/dL — ABNORMAL HIGH (ref 70–99)
Potassium: 3.6 mmol/L (ref 3.5–5.1)
Sodium: 135 mmol/L (ref 135–145)
Total Bilirubin: 0.7 mg/dL (ref 0.3–1.2)
Total Protein: 6.8 g/dL (ref 6.5–8.1)

## 2018-11-30 MED ORDER — ENOXAPARIN SODIUM 40 MG/0.4ML ~~LOC~~ SOLN
40.0000 mg | SUBCUTANEOUS | Status: DC
  Administered 2018-11-30 – 2018-12-01 (×2): 40 mg via SUBCUTANEOUS
  Filled 2018-11-30 (×2): qty 0.4

## 2018-11-30 MED ORDER — OSELTAMIVIR PHOSPHATE 75 MG PO CAPS
75.0000 mg | ORAL_CAPSULE | Freq: Once | ORAL | Status: AC
  Administered 2018-11-30: 75 mg via ORAL
  Filled 2018-11-30: qty 1

## 2018-11-30 MED ORDER — SODIUM CHLORIDE 0.9 % IV SOLN
1.0000 g | INTRAVENOUS | Status: DC
  Administered 2018-12-01: 1 g via INTRAVENOUS
  Filled 2018-11-30 (×2): qty 10

## 2018-11-30 MED ORDER — ELVITEG-COBIC-EMTRICIT-TENOFAF 150-150-200-10 MG PO TABS
1.0000 | ORAL_TABLET | Freq: Every day | ORAL | Status: DC
  Administered 2018-12-01 – 2018-12-02 (×2): 1 via ORAL
  Filled 2018-11-30 (×2): qty 1

## 2018-11-30 MED ORDER — ACETAMINOPHEN 325 MG PO TABS: 650.0000 mg | ORAL_TABLET | Freq: Four times a day (QID) | ORAL | Status: DC | PRN

## 2018-11-30 MED ORDER — ONDANSETRON HCL 4 MG PO TABS: 4.0000 mg | ORAL_TABLET | Freq: Four times a day (QID) | ORAL | Status: DC | PRN

## 2018-11-30 MED ORDER — SODIUM CHLORIDE 0.9 % IV SOLN
500.0000 mg | Freq: Once | INTRAVENOUS | Status: AC
  Administered 2018-11-30: 500 mg via INTRAVENOUS
  Filled 2018-11-30: qty 500

## 2018-11-30 MED ORDER — SODIUM CHLORIDE 0.9 % IV SOLN
500.0000 mg | INTRAVENOUS | Status: DC
  Administered 2018-12-01: 500 mg via INTRAVENOUS
  Filled 2018-11-30 (×2): qty 500

## 2018-11-30 MED ORDER — OSELTAMIVIR PHOSPHATE 75 MG PO CAPS
75.0000 mg | ORAL_CAPSULE | Freq: Two times a day (BID) | ORAL | Status: DC
  Administered 2018-12-01 – 2018-12-02 (×3): 75 mg via ORAL
  Filled 2018-11-30 (×3): qty 1

## 2018-11-30 MED ORDER — SODIUM CHLORIDE 0.9 % IV SOLN
1.0000 g | Freq: Once | INTRAVENOUS | Status: AC
  Administered 2018-11-30: 1 g via INTRAVENOUS
  Filled 2018-11-30: qty 10

## 2018-11-30 MED ORDER — ACETAMINOPHEN 650 MG RE SUPP: 650.0000 mg | Freq: Four times a day (QID) | RECTAL | Status: DC | PRN

## 2018-11-30 MED ORDER — ONDANSETRON HCL 4 MG/2ML IJ SOLN: 4.0000 mg | Freq: Four times a day (QID) | INTRAMUSCULAR | Status: DC | PRN

## 2018-11-30 NOTE — ED Triage Notes (Addendum)
Pt arrives POV for eval of cough, SOB, body aches and general malaise. Pt reports onset last Tuesday. States he has been running fevers at home. Tried OTC cold/cough medications w/ no relief. Endorses non compliance w/ HIV medication x 1 week.

## 2018-11-30 NOTE — ED Notes (Signed)
1st attempt to call report to [facility], no answer

## 2018-11-30 NOTE — ED Provider Notes (Signed)
[place] EMERGENCY DEPARTMENT Provider Note   CSN: [id number] Arrival date & time: 11/30/18  1428     History   Chief Complaint Chief Complaint  Patient presents with  . Flu Like Symptoms    HPI Juan Harrison is a 47 y.o. male.  The history is provided by the patient. No language interpreter was used.  Cough  This is a new problem. The current episode started more than 1 week ago. The problem occurs constantly. The problem has been gradually worsening. The cough is productive of sputum. The maximum temperature recorded prior to his arrival was 100 to 100.9 F. Associated symptoms include chest pain and shortness of breath. He has tried nothing for the symptoms. The treatment provided no relief. He is not a smoker. Past medical history comments: HIV .  Pt is followed by Dr. [name] in infectious disease.  Pt reports he is currently off HIV medications.  He is suppose to be taking genvoya.  Pt complains of bodyaches   Past Medical History:  Diagnosis Date  . AIDS [facility]) infectious disease--- dr hatcher   dx 2007,  usCD4,  count at 110  . Anal cancer [facility]) oncologist-  dr feng/  dr moody   intial dx 12-10-2016--  Invasive Squamous Cell carcinoma in situ, Stage IIIA ([id number]) --- completed concurrent radiation and chemotherapy (rxt 01-27-2017 chemo 01-24-2017)    . Anal condyloma   . Hepatitis B surface antigen positive   . History of cancer chemotherapy 12-16-2016 to 01-24-2017  . History of radiation therapy 12-16-2016 to 01-27-2017   55Gy in 30 sessions  . Pulmonary nodule, right    per Chest CT 12-06-2008  . Rectal mass    anal    Patient Active Problem List   Diagnosis Date Noted  . CKD (chronic kidney disease) stage 2, GFR 60-89 ml/min 04/28/2018  . Herpes genitalis in men 02/12/2018  . Tobacco use 10/13/2017  . PICC (peripherally inserted central catheter) in place 01/07/2017  . Anal cancer ([place]) 12/10/2016  . Hepatitis B immune 10/29/2016    . AIDS ([place]) 12/02/2005    Past Surgical History:  Procedure Laterality Date  . IR GENERIC HISTORICAL  12/16/2016   IR US GUIDE VASC ACCESS RIGHT 12/16/2016 Arne Cleveland, MD [facility]INTER[facility]  . IR GENERIC HISTORICAL  12/16/2016   IR FLUORO GUIDE CV LINE RIGHT 12/16/2016 Arne Cleveland, MD [facility]  . RECTAL BIOPSY N/A 11/29/2016   Procedure: BIOPSY ANAL CANAL MASS;  Surgeon: Leighton Ruff, MD;  Location: [place];  Service: General;  Laterality: N/A;  . RECTAL EXAM UNDER ANESTHESIA N/A 11/29/2016   Procedure: EXAM UNDER ANESTHESIA;  Surgeon: Leighton Ruff, MD;  Location: [place];  Service: General;  Laterality: N/A;  . RECTAL EXAM UNDER ANESTHESIA N/A 08/01/2017   Procedure: ANAL EXAM UNDER ANESTHESIA WITH BIOPSY;  Surgeon: Leighton Ruff, MD;  Location: [place];  Service: General;  Laterality: N/A;  . 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 Authorizing Provider  elvitegravir-cobicistat-emtricitabine-tenofovir (GENVOYA) 150-150-200-10 MG TABS tablet Take 1 tablet by mouth daily with breakfast. 08/24/18   Kuppelweiser, Cassie L, RPH-CPP  nicotine (NICODERM CQ) 21 mg/24hr patch Place 1 patch (21 mg total) onto the skin daily. Patient not taking: Reported on 09/23/2018 04/28/18 09/23/26  Campbell Riches, MD  ondansetron (ZOFRAN ODT) 4 MG disintegrating tablet Take 1 tablet (4 mg total) by mouth every 8 (eight) hours as needed for nausea or vomiting. 09/23/18   Fransico Meadow, PA-C    Family History Family History  Problem Relation Age of Onset  . COPD Mother   . Asthma Brother     Social History Social History   Tobacco Use  . Smoking status: Current Every Day Smoker    Packs/day: 0.25    Years: 20.00    Pack years: 5.00    Types: Cigarettes    Last attempt to quit: 10/13/2016    Years since quitting: 2.1  .  Smokeless tobacco: Never Used  . Tobacco comment: want the patch  Substance Use Topics  . Alcohol use: No    Alcohol/week: 1.0 standard drinks    Types: 1 Standard drinks or equivalent per week    Comment: previously drank alcohol on weekends for 25 years, he quit on 10/14/2016   . Drug use: Yes    Types: Marijuana     Allergies   Patient has no known allergies.   Review of Systems Review of Systems  Respiratory: Positive for cough and shortness of breath.   Cardiovascular: Positive for chest pain.  All other systems reviewed and are negative.    Physical Exam Updated Vital Signs BP (!) 115/96 (BP Location: Right Arm)   Pulse 93   Temp 99.8 F (37.7 C) (Oral)   Resp 18   Ht 6\' 3"  (1.905 m)   Wt 56.7 kg   SpO2 98%   BMI 15.62 kg/m   Physical Exam Vitals signs and nursing note reviewed.  Constitutional:      Appearance: He is well-developed.  HENT:     Head: Normocephalic and atraumatic.     Nose: Nose normal.     Mouth/Throat:     Mouth: Mucous membranes are moist.  Eyes:     Conjunctiva/sclera: Conjunctivae normal.  Neck:     Musculoskeletal: Neck supple.  Cardiovascular:     Rate and Rhythm: Normal rate and regular rhythm.     Heart sounds: No murmur.  Pulmonary:     Effort: Pulmonary effort is normal. No respiratory distress.     Breath sounds: Normal breath sounds.  Abdominal:     Palpations: Abdomen is soft.     Tenderness: There is no abdominal tenderness.  Skin:    General: Skin is warm and dry.  Neurological:     Mental Status: He is alert.      ED Treatments / Results  Labs (all labs ordered are listed, but only abnormal results are displayed) Labs Reviewed  CBC WITH DIFFERENTIAL/PLATELET - Abnormal; Notable for the following components:      Result Value   Hemoglobin 12.5 (*)    HCT 38.3 (*)    Platelets 147 (*)    Lymphs Abs 0.5 (*)    All other components within normal limits  COMPREHENSIVE METABOLIC PANEL - Abnormal; Notable  for the following components:   CO2 20 (*)    Glucose, Bld 129 (*)    Calcium 8.2 (*)    Albumin 3.2 (*)    All other components within normal limits  INFLUENZA PANEL BY PCR (TYPE A & B) - Abnormal; Notable for the following components:   Influenza B By PCR POSITIVE (*)    All other components within normal limits  CULTURE, BLOOD (ROUTINE X 2)  CULTURE, BLOOD (ROUTINE X 2)  EXPECTORATED SPUTUM ASSESSMENT  W REFEX TO RESP CULTURE  GRAM STAIN  URINALYSIS, ROUTINE W REFLEX MICROSCOPIC  STREP PNEUMONIAE URINARY ANTIGEN    EKG None  Radiology Dg Chest 2 View  Result Date: 11/30/2018 CLINICAL DATA:  Cough, chest congestion, chills, nausea and vomiting for the past 6 days. History of right pulmonary nodule and AIDS. Smoker. Anal cancer. EXAM: CHEST - 2 VIEW COMPARISON:  PET-CT dated 04/24/2018. FINDINGS: Mild patchy opacity in the right upper lobe, both lower lobes and right middle lobe/lingula. The lungs remain mildly hyperexpanded with mild peribronchial thickening. Unremarkable bones. IMPRESSION: 1. Mild bilateral multilobar pneumonia. 2. Stable mild changes of COPD and chronic bronchitis. Electronically Signed   By: Claudie Revering M.D.   On: 11/30/2018 18:15    Procedures Procedures (including critical care time)  Medications Ordered in ED Medications - No data to display   Initial Impression / Assessment and Plan / ED Course  I have reviewed the triage vital signs and the nursing notes.  Pertinent labs & imaging results that were available during my care of the patient were reviewed by me and considered in my medical decision making (see chart for details).     MDM  Chest xray shows pneumonia multifocal,  Influenza B positive.   (Pt did not get influenza shot)  Pt started on rocephin, zithromax and tamiflu.  Dr. Delford Field will admit   Final Clinical Impressions(s) / ED Diagnoses   Final diagnoses:  Community acquired pneumonia, unspecified laterality  Influenza B     ED Discharge Orders    None       Fransico Meadow, Hershal Coria 11/30/18 1936    Lennice Sites, DO 12/01/18 [phone]

## 2018-11-30 NOTE — H&P (Signed)
History and Physical    Juan Harrison:[id number] DOB: Oct 20, 1971 DOA: 11/30/2018  PCP: Patient, No Pcp Per  Patient coming from: Home.  Chief Complaint: Flulike symptoms and vomiting.  HPI: Juan Harrison is a 47 y.o. male with HIV presents to the ER because of increasing fatigue weakness productive cough and nausea vomiting.  Patient has been having productive cough and flulike symptoms for last 1 week.  Over the last 2 days patient has been having at least 3 episodes of vomiting shows nonbilious non-bloody.  Denies any diarrhea.  Denies any abdominal pain.  Since symptoms persisted patient came to the ER.  Patient has been having subjective feeling of fever and chills.  Has not taken his antiretroviral for HIV last 1 week due to patient symptoms and vomiting.  ED Course: In the ER patient was mildly febrile with temperature of 100.1.  Blood pressure was in the low normal.  Labs were largely unremarkable except for influenza type B being positive.  Chest x-ray shows bilateral infiltrates.  EKG shows nonspecific changes.  Given that patient is immunocompromise state-x-ray showing multifocal pneumonia with patient feeling fatigued patient admitted for treatment of pneumonia likely from influenza but since patient also has copious productive cough will continue the antibiotics for now.  Review of Systems: As per HPI, rest all negative.   Past Medical History:  Diagnosis Date  . 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 Anal cancer [facility]) oncologist-  dr feng/  dr moody   intial dx 12-10-2016--  Invasive Squamous Cell carcinoma in situ, Stage IIIA ([id number]) --- completed concurrent radiation and chemotherapy (rxt 01-27-2017 chemo 01-24-2017)    . Anal condyloma   . Hepatitis B surface antigen positive   . History of cancer chemotherapy 12-16-2016 to 01-24-2017  . History of radiation therapy 12-16-2016 to 01-27-2017   55Gy in 30 sessions    . Pulmonary nodule, right    per Chest CT 12-06-2008  . Rectal mass    anal    Past Surgical History:  Procedure Laterality Date  . IR GENERIC HISTORICAL  12/16/2016   IR US GUIDE VASC ACCESS RIGHT 12/16/2016 Arne Cleveland, MD [facility]  . IR GENERIC HISTORICAL  12/16/2016   IR FLUORO GUIDE CV LINE RIGHT 12/16/2016 Arne Cleveland, MD [facility]  . RECTAL BIOPSY N/A 11/29/2016   Procedure: BIOPSY ANAL CANAL MASS;  Surgeon: Leighton Ruff, MD;  Location: [place];  Service: General;  Laterality: N/A;  . RECTAL EXAM UNDER ANESTHESIA N/A 11/29/2016   Procedure: EXAM UNDER ANESTHESIA;  Surgeon: Leighton Ruff, MD;  Location: [place];  Service: General;  Laterality: N/A;  . RECTAL EXAM UNDER ANESTHESIA N/A 08/01/2017   Procedure: ANAL EXAM UNDER ANESTHESIA WITH BIOPSY;  Surgeon: Leighton Ruff, MD;  Location: [place];  Service: General;  Laterality: N/A;  . 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 He has a 5.00 pack-year smoking history. He has never used smokeless tobacco. He reports current drug use. Drug: Marijuana. He reports that he does not drink alcohol.  No Known Allergies  Family History  Problem Relation Age of Onset  . COPD Mother   . Asthma Brother     Prior to Admission medications   Medication Sig Start Date End Date Taking? Authorizing Provider  elvitegravir-cobicistat-emtricitabine-tenofovir (GENVOYA) 150-150-200-10 MG TABS tablet  Take 1 tablet by mouth daily with breakfast. 08/24/18   Kuppelweiser, Cassie L, RPH-CPP  nicotine (NICODERM CQ) 21 mg/24hr patch Place 1 patch (21 mg total) onto the skin daily. Patient not taking: Reported on 09/23/2018 04/28/18 09/23/26  Campbell Riches, MD  ondansetron (ZOFRAN ODT) 4 MG disintegrating tablet Take 1 tablet (4 mg total) by mouth every 8 (eight) hours as needed for nausea or vomiting. 09/23/18   Fransico Meadow, PA-C     Physical Exam: Vitals:   11/30/18 1502  BP: (!) 115/96  Pulse: 93  Resp: 18  Temp: 99.8 F (37.7 C)  TempSrc: Oral  SpO2: 98%  Weight: 56.7 kg  Height: 6\' 3"  (1.905 m)      Constitutional: Moderately built and nourished. Vitals:   11/30/18 1502  BP: (!) 115/96  Pulse: 93  Resp: 18  Temp: 99.8 F (37.7 C)  TempSrc: Oral  SpO2: 98%  Weight: 56.7 kg  Height: 6\' 3"  (1.905 m)   Eyes: Anicteric no pallor. ENMT: No discharge from the ears eyes nose or mouth. Neck: No mass felt.  No neck rigidity.  No JVD appreciated. Respiratory: No rhonchi or crepitations. Cardiovascular: S1-S2 heard. Abdomen: Soft nontender bowel sounds present. Musculoskeletal: No edema.  No joint effusion.  Skin: No rash. Neurologic: Alert awake oriented to time place and person.  Moves all extremities. Psychiatric: Appears normal.   Labs on Admission: I have personally reviewed following labs and imaging studies  CBC: Recent Labs  Lab 11/30/18 1740  WBC 7.1  NEUTROABS 6.2  HGB 12.5*  HCT 38.3*  MCV 90.5  PLT 144*   Basic Metabolic Panel: Recent Labs  Lab 11/30/18 1740  NA 135  K 3.6  CL 102  CO2 20*  GLUCOSE 129*  BUN 13  CREATININE 1.12  CALCIUM 8.2*   GFR: Estimated Creatinine Clearance: 66.1 mL/min (by C-G formula based on SCr of 1.12 mg/dL). Liver Function Tests: Recent Labs  Lab 11/30/18 1740  AST 24  ALT 33  ALKPHOS 56  BILITOT 0.7  PROT 6.8  ALBUMIN 3.2*   No results for input(s): LIPASE, AMYLASE in the last 168 hours. No results for input(s): AMMONIA in the last 168 hours. Coagulation Profile: No results for input(s): INR, PROTIME in the last 168 hours. Cardiac Enzymes: No results for input(s): CKTOTAL, CKMB, CKMBINDEX, TROPONINI in the last 168 hours. BNP (last 3 results) No results for input(s): PROBNP in the last 8760 hours. HbA1C: No results for input(s): HGBA1C in the last 72 hours. CBG: No results for input(s): GLUCAP in the last 168  hours. Lipid Profile: No results for input(s): CHOL, HDL, LDLCALC, TRIG, CHOLHDL, LDLDIRECT in the last 72 hours. Thyroid Function Tests: No results for input(s): TSH, T4TOTAL, FREET4, T3FREE, THYROIDAB in the last 72 hours. Anemia Panel: No results for input(s): VITAMINB12, FOLATE, FERRITIN, TIBC, IRON, RETICCTPCT in the last 72 hours. Urine analysis:    Component Value Date/Time   COLORURINE AMBER (A) 09/23/2018 0933   APPEARANCEUR CLEAR 09/23/2018 0933   LABSPEC 1.030 09/23/2018 0933   PHURINE 6.0 09/23/2018 0933   GLUCOSEU NEGATIVE 09/23/2018 0933   HGBUR NEGATIVE 09/23/2018 0933   BILIRUBINUR NEGATIVE 09/23/2018 0933   KETONESUR 20 (A) 09/23/2018 0933   PROTEINUR 30 (A) 09/23/2018 0933   NITRITE NEGATIVE 09/23/2018 0933   LEUKOCYTESUR TRACE (A) 09/23/2018 0933   Sepsis Labs: @LABRCNTIP (procalcitonin:4,lacticidven:4) )No results found for this or any previous visit (from the past 240 hour(s)).   Radiological Exams on Admission: Dg Chest  2 View  Result Date: 11/30/2018 CLINICAL DATA:  Cough, chest congestion, chills, nausea and vomiting for the past 6 days. History of right pulmonary nodule and AIDS. Smoker. Anal cancer. EXAM: CHEST - 2 VIEW COMPARISON:  PET-CT dated 04/24/2018. FINDINGS: Mild patchy opacity in the right upper lobe, both lower lobes and right middle lobe/lingula. The lungs remain mildly hyperexpanded with mild peribronchial thickening. Unremarkable bones. IMPRESSION: 1. Mild bilateral multilobar pneumonia. 2. Stable mild changes of COPD and chronic bronchitis. Electronically Signed   By: Claudie Revering M.D.   On: 11/30/2018 18:15    EKG: Independently reviewed.  Normal sinus rhythm with nonspecific ST changes.  Assessment/Plan Principal Problem:   CAP (community acquired pneumonia) Active Problems:   AIDS ([place])    1. Community-acquired pneumonia -likely from influenza.  Patient has been having symptoms for last 1 week but since patient is immunocompromise  state patient has been started on Tamiflu.  Will also continue ceftriaxone and Zithromax for now.  LDH is pending along with troponin.  Patient had stated of some chest pain to the ER physician but to me he has not complained of any chest pain. 2. HIV has not taken his medications for last 1 week.  Last CD4 count was around 280 in May 2019.  Continue antiretroviral.  LDH is pending. 3. Nausea vomiting -could be from influenza.  Abdomen appears benign.  LFTs are normal.  Continue to observe. 4. COPD per the chest x-ray presently not wheezing. 5. Normocytic normochromic anemia appears to be new.  Will check anemia panel follow CBC.   DVT prophylaxis: Lovenox. Code Status: Full code. Family Communication: Discussed with patient. Disposition Plan: Home. Consults called: None. Admission status: Observation.   Rise Patience MD [facility] Pager [phone].  If 7PM-7AM, please contact night-coverage [email or web address] Password [email or web address]  11/30/2018, 9:21 PM

## 2018-11-30 NOTE — ED Notes (Signed)
Report called to 20m04 RN

## 2018-12-01 DIAGNOSIS — D649 Anemia, unspecified: Secondary | ICD-10-CM | POA: Diagnosis not present

## 2018-12-01 DIAGNOSIS — J1008 Influenza due to other identified influenza virus with other specified pneumonia: Secondary | ICD-10-CM | POA: Diagnosis present

## 2018-12-01 DIAGNOSIS — J189 Pneumonia, unspecified organism: Secondary | ICD-10-CM | POA: Diagnosis not present

## 2018-12-01 DIAGNOSIS — Z825 Family history of asthma and other chronic lower respiratory diseases: Secondary | ICD-10-CM | POA: Diagnosis not present

## 2018-12-01 DIAGNOSIS — R0602 Shortness of breath: Secondary | ICD-10-CM | POA: Diagnosis not present

## 2018-12-01 DIAGNOSIS — F17210 Nicotine dependence, cigarettes, uncomplicated: Secondary | ICD-10-CM | POA: Diagnosis present

## 2018-12-01 DIAGNOSIS — R739 Hyperglycemia, unspecified: Secondary | ICD-10-CM | POA: Diagnosis not present

## 2018-12-01 DIAGNOSIS — Z85048 Personal history of other malignant neoplasm of rectum, rectosigmoid junction, and anus: Secondary | ICD-10-CM | POA: Diagnosis not present

## 2018-12-01 DIAGNOSIS — B20 Human immunodeficiency virus [HIV] disease: Secondary | ICD-10-CM | POA: Diagnosis not present

## 2018-12-01 DIAGNOSIS — Z923 Personal history of irradiation: Secondary | ICD-10-CM | POA: Diagnosis not present

## 2018-12-01 DIAGNOSIS — Z85118 Personal history of other malignant neoplasm of bronchus and lung: Secondary | ICD-10-CM | POA: Diagnosis not present

## 2018-12-01 DIAGNOSIS — J440 Chronic obstructive pulmonary disease with acute lower respiratory infection: Secondary | ICD-10-CM | POA: Diagnosis present

## 2018-12-01 DIAGNOSIS — J439 Emphysema, unspecified: Secondary | ICD-10-CM | POA: Diagnosis not present

## 2018-12-01 DIAGNOSIS — Z9221 Personal history of antineoplastic chemotherapy: Secondary | ICD-10-CM | POA: Diagnosis not present

## 2018-12-01 LAB — LIPASE, BLOOD: Lipase: 18 U/L (ref 11–51)

## 2018-12-01 LAB — URINALYSIS, ROUTINE W REFLEX MICROSCOPIC
Bacteria, UA: NONE SEEN
Bilirubin Urine: NEGATIVE
Glucose, UA: NEGATIVE mg/dL
HGB URINE DIPSTICK: NEGATIVE
Ketones, ur: NEGATIVE mg/dL
Leukocytes, UA: NEGATIVE
NITRITE: NEGATIVE
PH: 6 (ref 5.0–8.0)
Protein, ur: 100 mg/dL — AB
SPECIFIC GRAVITY, URINE: 1.027 (ref 1.005–1.030)

## 2018-12-01 LAB — RETICULOCYTES
IMMATURE RETIC FRACT: 4.6 % (ref 2.3–15.9)
RBC.: 4.4 MIL/uL (ref 4.22–5.81)
Retic Count, Absolute: 19.8 10*3/uL (ref 19.0–186.0)
Retic Ct Pct: 0.5 % (ref 0.4–3.1)

## 2018-12-01 LAB — BASIC METABOLIC PANEL
Anion gap: 9 (ref 5–15)
BUN: 12 mg/dL (ref 6–20)
CO2: 25 mmol/L (ref 22–32)
Calcium: 8.4 mg/dL — ABNORMAL LOW (ref 8.9–10.3)
Chloride: 104 mmol/L (ref 98–111)
Creatinine, Ser: 1.04 mg/dL (ref 0.61–1.24)
GFR calc Af Amer: >60 mL/min (ref >60)
GFR calc non Af Amer: >60 mL/min (ref >60)
GLUCOSE: 106 mg/dL — AB (ref 70–99)
Potassium: 3.9 mmol/L (ref 3.5–5.1)
Sodium: 138 mmol/L (ref 135–145)

## 2018-12-01 LAB — IRON AND TIBC
Iron: 16 ug/dL — ABNORMAL LOW (ref 45–182)
Saturation Ratios: 8 % — ABNORMAL LOW (ref 17.9–39.5)
TIBC: 207 ug/dL — ABNORMAL LOW (ref 250–450)
UIBC: 191 ug/dL

## 2018-12-01 LAB — T-HELPER CELLS (CD4) COUNT (NOT AT ARMC)
CD4 % Helper T Cell: 11 % — ABNORMAL LOW (ref 33–55)
CD4 T CELL ABS: 100 /uL — AB (ref 400–2700)

## 2018-12-01 LAB — CBC
HCT: 39.7 % (ref 39.0–52.0)
Hemoglobin: 13.1 g/dL (ref 13.0–17.0)
MCH: 29.8 pg (ref 26.0–34.0)
MCHC: 33 g/dL (ref 30.0–36.0)
MCV: 90.2 fL (ref 80.0–100.0)
Platelets: 170 10*3/uL (ref 150–400)
RBC: 4.4 MIL/uL (ref 4.22–5.81)
RDW: 13.7 % (ref 11.5–15.5)
WBC: 8.7 10*3/uL (ref 4.0–10.5)
nRBC: 0 % (ref 0.0–0.2)

## 2018-12-01 LAB — PHOSPHORUS: Phosphorus: 2.2 mg/dL — ABNORMAL LOW (ref 2.5–4.6)

## 2018-12-01 LAB — STREP PNEUMONIAE URINARY ANTIGEN: Strep Pneumo Urinary Antigen: NEGATIVE

## 2018-12-01 LAB — FERRITIN: Ferritin: 175 ng/mL (ref 24–336)

## 2018-12-01 LAB — LACTATE DEHYDROGENASE: LDH: 147 U/L (ref 98–192)

## 2018-12-01 LAB — MAGNESIUM: Magnesium: 2.2 mg/dL (ref 1.7–2.4)

## 2018-12-01 LAB — VITAMIN B12: Vitamin B-12: 174 pg/mL — ABNORMAL LOW (ref 180–914)

## 2018-12-01 LAB — FOLATE: Folate: 9.4 ng/mL (ref >5.9)

## 2018-12-01 LAB — TROPONIN I: Troponin I: <0.03 ng/mL (ref <0.03)

## 2018-12-01 MED ORDER — K PHOS MONO-SOD PHOS DI & MONO 155-852-130 MG PO TABS
500.0000 mg | ORAL_TABLET | Freq: Two times a day (BID) | ORAL | Status: AC
  Administered 2018-12-01 – 2018-12-02 (×2): 500 mg via ORAL
  Filled 2018-12-01 (×2): qty 2

## 2018-12-01 NOTE — Plan of Care (Signed)
  Problem: Activity: Goal: Ability to tolerate increased activity will improve Outcome: Progressing   Problem: Clinical Measurements: Goal: Ability to maintain a body temperature in the normal range will improve Outcome: Progressing   Problem: Respiratory: Goal: Ability to maintain adequate ventilation will improve Outcome: Progressing Goal: Ability to maintain a clear airway will improve Outcome: Progressing   

## 2018-12-01 NOTE — Progress Notes (Signed)
PROGRESS NOTE    Juan Harrison  ZOX:[id number] DOB: [birth year] DOA: 11/30/2018 PCP: Patient, No Pcp Per   Brief Narrative:  HPI per Dr. Gean Birchwood 11/30/18 Juan Harrison is a 47 y.o. male with HIV presents to the ER because of increasing fatigue weakness productive cough and nausea vomiting.  Patient has been having productive cough and flulike symptoms for last 1 week.  Over the last 2 days patient has been having at least 3 episodes of vomiting shows nonbilious non-bloody.  Denies any diarrhea.  Denies any abdominal pain.  Since symptoms persisted patient came to the ER.  Patient has been having subjective feeling of fever and chills.  Has not taken his antiretroviral for HIV last 1 week due to patient symptoms and vomiting.  ED Course: In the ER patient was mildly febrile with temperature of 100.1.  Blood pressure was in the low normal.  Labs were largely unremarkable except for influenza type B being positive.  Chest x-ray shows bilateral infiltrates.  EKG shows nonspecific changes.  Given that patient is immunocompromise state-x-ray showing multifocal pneumonia with patient feeling fatigued patient admitted for treatment of pneumonia likely from influenza but since patient also has copious productive cough will continue the antibiotics for now.  Assessment & Plan:   Principal Problem:   CAP (community acquired pneumonia) Active Problems:   AIDS ([place])  Community-Acquired Pneumonia  -Likely from Influenza. Patient has been having symptoms for last 1 week but since patient is immunocompromised state  -Patient has been started on Tamiflu.   -Will also continue ceftriaxone and Zithromax for now.   -LDH is 147 -Patient had stated of some chest pain to the ER physician but to me he has not complained of any chest pain. -Troponin was <0.03 -Will need to do Home Ambulatory Screen prior to D/C  Influenza B -C/w Oseltamivir 75 mg po BID   HIV  -has not taken his  medications for last 1 week.   -Last CD4 count was around 280 in May 2019 -Repeat CD4 T Cell Count was 100 and CD4% Helper was 11 -Continue ART with Genvoya.   -LDH was 147  Nausea and Vomiting  -Could be from Influenza.   -Abdomen appears benign.   -LFTs are normal.   -Continue to observe and C/w Antiemetics with po/IV Zofran.  COPD  -Per the chest x-ray  -Presently not wheezing.  Normocytic Normochromic Anemia  -Appears to be new. -May panel showed iron level 16, U IBC of 191, TIBC of 207, saturation ratios of 8%, ferritin level 135, folate level 9.4, and vitamin B12 level 174 -Continue to Monitor for S/Sx of Bleeding -Repeat CBC iN AM   Hypophosphatemia -Patient's phosphorus level is 2.2 -Replete with p.o. K-Phos Neutral 500 mg p.o. twice daily x2 doses -Continue monitor and replete as necessary -Repeat phosphorus level in the a.m.  DVT prophylaxis: Enoxaparin 40 mg sq q24h Code Status: FULL CODE Family Communication: No family present at bedside Disposition Plan: Anticipate D/C in the next 24-48 hours  Consultants: None   Procedures: None   Antimicrobials:  Anti-infectives (From admission, onward)   Start     Dose/Rate Route Frequency Ordered Stop   12/01/18 2000  cefTRIAXone (ROCEPHIN) 1 g in sodium chloride 0.9 % 100 mL IVPB     1 g 200 mL/hr over 30 Minutes Intravenous Every 24 hours 11/30/18 2121 12/08/18 1959   12/01/18 2000  azithromycin (ZITHROMAX) 500 mg in sodium chloride 0.9 % 250 mL IVPB  500 mg 250 mL/hr over 60 Minutes Intravenous Every 24 hours 11/30/18 2121 12/08/18 1959   12/01/18 1000  oseltamivir (TAMIFLU) capsule 75 mg     75 mg Oral 2 times daily 11/30/18 2121 12/05/18 2159   12/01/18 0800  elvitegravir-cobicistat-emtricitabine-tenofovir (GENVOYA) 150-150-200-10 MG tablet 1 tablet     1 tablet Oral Daily with breakfast 11/30/18 2121     11/30/18 1945  oseltamivir (TAMIFLU) capsule 75 mg     75 mg Oral  Once 11/30/18 1935 11/30/18 2030    11/30/18 1915  cefTRIAXone (ROCEPHIN) 1 g in sodium chloride 0.9 % 100 mL IVPB     1 g 200 mL/hr over 30 Minutes Intravenous  Once 11/30/18 1912 11/30/18 2052   11/30/18 1915  azithromycin (ZITHROMAX) 500 mg in sodium chloride 0.9 % 250 mL IVPB     500 mg 250 mL/hr over 60 Minutes Intravenous  Once 11/30/18 1912 11/30/18 2052     Subjective: Seen and examined at bedside states he is feeling better.  No chest pain, lightheadedness or dizziness.  No nausea or vomiting today.  Wanting to know when he can go home.  Objective: Vitals:   11/30/18 1502 11/30/18 2226 12/01/18 0530  BP: (!) 115/96 (!) 91/59 (!) 98/58  Pulse: 93 74 72  Resp: 18 16 14   Temp: 99.8 F (37.7 C) 100.1 F (37.8 C) 98.6 F (37 C)  TempSrc: Oral Oral Oral  SpO2: 98% 99% 100%  Weight: 56.7 kg 59.2 kg   Height: 6\' 3"  (1.905 m) 6\' 3"  (1.905 m)     Intake/Output Summary (Last 24 hours) at 12/01/2018 6712 Last data filed at 12/01/2018 4580 Gross per 24 hour  Intake 350 ml  Output 0 ml  Net 350 ml   Filed Weights   11/30/18 1502 11/30/18 2226  Weight: 56.7 kg 59.2 kg   Examination: Physical Exam:  Constitutional: WN/WD underweight AAM in NAD and appears calm Eyes: Lids and conjunctivae normal, sclerae anicteric  ENMT: External Ears, Nose appear normal. Grossly normal hearing.  Neck: Appears normal, supple, no cervical masses, normal ROM, no appreciable thyromegaly; no JVD Respiratory: Diminished to auscultation bilaterally, no wheezing, rales, rhonchi or crackles. Normal respiratory effort and patient is not tachypenic. No accessory muscle use.  Cardiovascular: RRR, no murmurs / rubs / gallops. S1 and S2 auscultated.  Abdomen: Soft, non-tender, non-distended. No masses palpated. No appreciable hepatosplenomegaly. Bowel sounds positive x4.  GU: Deferred. Musculoskeletal: No clubbing / cyanosis of digits/nails. No joint deformity upper and lower extremities. Skin: No rashes, lesions, ulcers on a limited skin  evaluation. No induration; Warm and dry.  Neurologic: CN 2-12 grossly intact with no focal deficits.  Romberg sign and cerebellar reflexes not assessed.  Psychiatric: Normal judgment and insight. Alert and oriented x 3. Normal mood and appropriate affect.   Data Reviewed: I have personally reviewed following labs and imaging studies  CBC: Recent Labs  Lab 11/30/18 1740  WBC 7.1  NEUTROABS 6.2  HGB 12.5*  HCT 38.3*  MCV 90.5  PLT 998*   Basic Metabolic Panel: Recent Labs  Lab 11/30/18 1740  NA 135  K 3.6  CL 102  CO2 20*  GLUCOSE 129*  BUN 13  CREATININE 1.12  CALCIUM 8.2*   GFR: Estimated Creatinine Clearance: 69 mL/min (by C-G formula based on SCr of 1.12 mg/dL). Liver Function Tests: Recent Labs  Lab 11/30/18 1740  AST 24  ALT 33  ALKPHOS 56  BILITOT 0.7  PROT 6.8  ALBUMIN 3.2*  No results for input(s): LIPASE, AMYLASE in the last 168 hours. No results for input(s): AMMONIA in the last 168 hours. Coagulation Profile: No results for input(s): INR, PROTIME in the last 168 hours. Cardiac Enzymes: No results for input(s): CKTOTAL, CKMB, CKMBINDEX, TROPONINI in the last 168 hours. BNP (last 3 results) No results for input(s): PROBNP in the last 8760 hours. HbA1C: No results for input(s): HGBA1C in the last 72 hours. CBG: No results for input(s): GLUCAP in the last 168 hours. Lipid Profile: No results for input(s): CHOL, HDL, LDLCALC, TRIG, CHOLHDL, LDLDIRECT in the last 72 hours. Thyroid Function Tests: No results for input(s): TSH, T4TOTAL, FREET4, T3FREE, THYROIDAB in the last 72 hours. Anemia Panel: No results for input(s): VITAMINB12, FOLATE, FERRITIN, TIBC, IRON, RETICCTPCT in the last 72 hours. Sepsis Labs: No results for input(s): PROCALCITON, LATICACIDVEN in the last 168 hours.  Recent Results (from the past 240 hour(s))  Culture, blood (routine x 2) Call MD if unable to obtain prior to antibiotics being given     Status: None (Preliminary  result)   Collection Time: 11/30/18  7:02 PM  Result Value Ref Range Status   Specimen Description BLOOD BLOOD LEFT WRIST  Final   Special Requests   Final    BOTTLES DRAWN AEROBIC AND ANAEROBIC Blood Culture adequate volume   Culture   Final    NO GROWTH < 12 HOURS Performed at [facility], [address], [place], [place] [address]    Report Status PENDING  Incomplete  Culture, blood (routine x 2) Call MD if unable to obtain prior to antibiotics being given     Status: None (Preliminary result)   Collection Time: 11/30/18  7:07 PM  Result Value Ref Range Status   Specimen Description BLOOD BLOOD LEFT FOREARM  Final   Special Requests   Final    BOTTLES DRAWN AEROBIC AND ANAEROBIC Blood Culture adequate volume   Culture   Final    NO GROWTH < 12 HOURS Performed at [facility], [place] [address], [place], [place] [address]    Report Status PENDING  Incomplete    Radiology Studies: Dg Chest 2 View  Result Date: 11/30/2018 CLINICAL DATA:  Cough, chest congestion, chills, nausea and vomiting for the past 6 days. History of right pulmonary nodule and AIDS. Smoker. Anal cancer. EXAM: CHEST - 2 VIEW COMPARISON:  PET-CT dated 04/24/2018. FINDINGS: Mild patchy opacity in the right upper lobe, both lower lobes and right middle lobe/lingula. The lungs remain mildly hyperexpanded with mild peribronchial thickening. Unremarkable bones. IMPRESSION: 1. Mild bilateral multilobar pneumonia. 2. Stable mild changes of COPD and chronic bronchitis. Electronically Signed   By: Claudie Revering M.D.   On: 11/30/2018 18:15   Scheduled Meds: . elvitegravir-cobicistat-emtricitabine-tenofovir  1 tablet Oral Q breakfast  . enoxaparin (LOVENOX) injection  40 mg Subcutaneous Q24H  . oseltamivir  75 mg Oral BID   Continuous Infusions: . azithromycin    . cefTRIAXone (ROCEPHIN)  IV      LOS: 0 days   Kerney Elbe, DO [facility] PAGER is on AMION  If 7PM-7AM, please contact  night-coverage [email or web address] Password [facility] 12/01/2018, 8:17 AM

## 2018-12-02 ENCOUNTER — Inpatient Hospital Stay (HOSPITAL_COMMUNITY): Payer: Medicare Other

## 2018-12-02 LAB — COMPREHENSIVE METABOLIC PANEL
ALT: 28 U/L (ref 0–44)
AST: 17 U/L (ref 15–41)
Albumin: 2.7 g/dL — ABNORMAL LOW (ref 3.5–5.0)
Alkaline Phosphatase: 55 U/L (ref 38–126)
Anion gap: 10 (ref 5–15)
BUN: 14 mg/dL (ref 6–20)
CHLORIDE: 104 mmol/L (ref 98–111)
CO2: 24 mmol/L (ref 22–32)
Calcium: 8.4 mg/dL — ABNORMAL LOW (ref 8.9–10.3)
Creatinine, Ser: 1.01 mg/dL (ref 0.61–1.24)
GFR calc Af Amer: >60 mL/min (ref >60)
GFR calc non Af Amer: >60 mL/min (ref >60)
Glucose, Bld: 100 mg/dL — ABNORMAL HIGH (ref 70–99)
Potassium: 3.7 mmol/L (ref 3.5–5.1)
Sodium: 138 mmol/L (ref 135–145)
Total Bilirubin: 0.6 mg/dL (ref 0.3–1.2)
Total Protein: 6.6 g/dL (ref 6.5–8.1)

## 2018-12-02 LAB — CBC WITH DIFFERENTIAL/PLATELET
Abs Immature Granulocytes: 0.1 10*3/uL — ABNORMAL HIGH (ref 0.00–0.07)
Basophils Absolute: 0 10*3/uL (ref 0.0–0.1)
Basophils Relative: 0 %
Eosinophils Absolute: 0.1 10*3/uL (ref 0.0–0.5)
Eosinophils Relative: 1 %
HCT: 40.3 % (ref 39.0–52.0)
Hemoglobin: 13.4 g/dL (ref 13.0–17.0)
Immature Granulocytes: 1 %
Lymphocytes Relative: 14 %
Lymphs Abs: 1.1 10*3/uL (ref 0.7–4.0)
MCH: 30 pg (ref 26.0–34.0)
MCHC: 33.3 g/dL (ref 30.0–36.0)
MCV: 90.2 fL (ref 80.0–100.0)
Monocytes Absolute: 0.7 10*3/uL (ref 0.1–1.0)
Monocytes Relative: 9 %
NEUTROS PCT: 75 %
Neutro Abs: 6 10*3/uL (ref 1.7–7.7)
PLATELETS: 225 10*3/uL (ref 150–400)
RBC: 4.47 MIL/uL (ref 4.22–5.81)
RDW: 13.5 % (ref 11.5–15.5)
WBC: 8.1 10*3/uL (ref 4.0–10.5)
nRBC: 0 % (ref 0.0–0.2)

## 2018-12-02 LAB — MAGNESIUM: Magnesium: 2.2 mg/dL (ref 1.7–2.4)

## 2018-12-02 LAB — PHOSPHORUS: Phosphorus: 2.5 mg/dL (ref 2.5–4.6)

## 2018-12-02 MED ORDER — LEVOFLOXACIN 750 MG PO TABS: 750.0000 mg | ORAL_TABLET | Freq: Every day | ORAL | 0 refills | Status: DC

## 2018-12-02 MED ORDER — OSELTAMIVIR PHOSPHATE 75 MG PO CAPS: 75.0000 mg | ORAL_CAPSULE | Freq: Two times a day (BID) | ORAL | 0 refills | Status: DC

## 2018-12-02 MED ORDER — SULFAMETHOXAZOLE-TRIMETHOPRIM 800-160 MG PO TABS: 1.0000 | ORAL_TABLET | ORAL | 0 refills | Status: DC

## 2018-12-02 NOTE — Progress Notes (Addendum)
Pt.'s oxygen sat. while ambulating was in the 96's. No signs of distress. Will continue to monitor.

## 2018-12-02 NOTE — Discharge Summary (Signed)
Physician Discharge Summary  Juan Harrison KZS:[id number] DOB: Oct 08, 1971 DOA: 11/30/2018  PCP: Patient, No Pcp Per  Admit date: 11/30/2018 Discharge date: 12/02/2018  Admitted From: Home Disposition: Home  Recommendations for Outpatient Follow-up:  1. Follow up with PCP in 1-2 weeks 2. Follow up with Infectious Diseases Dr. [name] within 1-2 weeks 3. Please obtain CMP/CBC, Mag, Phos in one week 4. Please follow up on the following pending results:  Home Health: No Equipment/Devices: None  Discharge Condition: Stable  CODE STATUS: FULL CODE Diet recommendation: Regular Diet  Brief/Interim Summary: HPI per Dr. Gean Birchwood 11/30/18 Juan Sages Daileyis a 48 y.o.malewithHIV presents to the ER because of increasing fatigue weakness productive cough and nausea vomiting. Patient has been having productive cough and flulike symptoms for last 1 week. Over the last 2 days patient has been having at least 3 episodes of vomiting shows nonbilious non-bloody. Denies any diarrhea. Denies any abdominal pain. Since symptoms persisted patient came to the ER. Patient has been having subjective feeling of fever and chills. Has not taken his antiretroviral for HIV last 1 week due to patient symptoms and vomiting.  ED Course:In the ER patient was mildly febrile with temperature of 100.1. Blood pressure was in the low normal. Labs were largely unremarkable except for influenza type B being positive. Chest x-ray shows bilateral infiltrates. EKG shows nonspecific changes. Given that patient is immunocompromise state-x-ray showing multifocal pneumonia with patient feeling fatigued patient admitted for treatment of pneumonia likely from influenza but since patient also has copious productive cough will continue the antibiotics for now.  **Patient improved and was doing well.  He had a ambulatory screen done and did not desaturate on room air.  He felt better and sounded better to.   States he still coughing up some sputum.  Denies other complaints or concerns and ready to be discharged home  Discharge Diagnoses:  Principal Problem:   CAP (community acquired pneumonia) Active Problems:   AIDS ([place])  Community-Acquired Pneumonia -Likely from Influenza. Patient has been having symptoms for last 1 week but since patient is immunocompromised state  -Patient has been started on Tamiflu.  -Continue ceftriaxone and Zithromax for now while hospitalized and change to po Levofloxacin at D/C.  -LDH is 147 -Patient had stated of some chest pain to the ER physician but to me he has not complained of any chest pain. -Troponin was <0.03 -Will need to do Home Ambulatory Screen prior to D/C and he did not Desaturate -Repeat CXR showed "The patchy ill-defined areas of airspace disease noted in the lungs on the prior examination have improve slightly, particularly in the right upper lobe. There continues to be some ill-defined opacities throughout the lung bases bilaterally, compatible with residual bronchopneumonia. Mild emphysematous changes. No pleural effusions. No evidence of pulmonary edema. Heart size is normal. Upper mediastinal contours are within normal limits." -Repeat CXR as an outpatient   Influenza B -C/w Oseltamivir 75 mg po BID for completion of course -C/w Supportive Care  HIV/AIDS -has not taken his medications for last 1 week.  -Last CD4 count was around 280 in May 2019 -Repeat CD4 T Cell Count was 100 and CD4% Helper was 11 -Continue ART with Genvoya.  -LDH was 147 -Will D/C on Bactrim 1 DS 3x/week for prophylaxis  -Follow up with Infectious Diseases Dr. [name] within 1 week  Nausea and Vomiting, improved  -Could be from Influenza.  -Abdomen appears benign.  -LFTs are normal.  -Continued to observe and C/w Antiemetics with  po/IV Zofran -Follow up as an outpatient.  COPD with Emphysema -Per the chest x-ray  -Presently not wheezing. -Will  need outpatient workup and official PFT's  Normocytic Normochromic Anemia  -Appears to be new. -May panel showed iron level 16, U IBC of 191, TIBC of 207, saturation ratios of 8%, ferritin level 135, folate level 9.4, and vitamin B12 level 174 -Continue to Monitor for S/Sx of Bleeding -Repeat CBC as an outpatient    Hypophosphatemia -Patient's phosphorus level was 2.2 and improved to 2.5 -Replete with p.o. K-Phos Neutral 500 mg p.o. twice daily x2 doses yesterday -Continue monitor and replete as necessary -Repeat phosphorus level as an outpatient .  Hyperglycemia -Likely Reactive -Have PCP check hemoglobin A1c in outpatient setting as blood glucose was 100 on BMP this morning  Discharge Instructions  Discharge Instructions    Call MD for:  difficulty breathing, headache or visual disturbances   Complete by:  As directed    Call MD for:  extreme fatigue   Complete by:  As directed    Call MD for:  hives   Complete by:  As directed    Call MD for:  persistant dizziness or light-headedness   Complete by:  As directed    Call MD for:  persistant nausea and vomiting   Complete by:  As directed    Call MD for:  redness, tenderness, or signs of infection (pain, swelling, redness, odor or green/yellow discharge around incision site)   Complete by:  As directed    Call MD for:  severe uncontrolled pain   Complete by:  As directed    Call MD for:  temperature >100.4   Complete by:  As directed    Diet - low sodium heart healthy   Complete by:  As directed    Discharge instructions   Complete by:  As directed    You were cared for by a hospitalist during your hospital stay. If you have any questions about your discharge medications or the care you received while you were in the hospital after you are discharged, you can call the unit and ask to speak with the hospitalist on call if the hospitalist that took care of you is not available. Once you are discharged, your primary care  physician will handle any further medical issues. Please note that NO REFILLS for any discharge medications will be authorized once you are discharged, as it is imperative that you return to your primary care physician (or establish a relationship with a primary care physician if you do not have one) for your aftercare needs so that they can reassess your need for medications and monitor your lab values.  Follow up with PCP and Infectious Diseases as an outpateint. Take all medications as prescribed. If symptoms change or worsen please return to the ED for evaluation   Increase activity slowly   Complete by:  As directed      Allergies as of 12/02/2018   No Known Allergies     Medication List    TAKE these medications   elvitegravir-cobicistat-emtricitabine-tenofovir 150-150-200-10 MG Tabs tablet Commonly known as:  GENVOYA Take 1 tablet by mouth daily with breakfast.   levofloxacin 750 MG tablet Commonly known as:  LEVAQUIN Take 1 tablet (750 mg total) by mouth daily.   oseltamivir 75 MG capsule Commonly known as:  TAMIFLU Take 1 capsule (75 mg total) by mouth 2 (two) times daily.   sulfamethoxazole-trimethoprim 800-160 MG tablet Commonly known as:  BACTRIM DS,SEPTRA  DS Take 1 tablet by mouth 3 (three) times a week.       No Known Allergies  Consultations:  None  Procedures/Studies: Dg Chest 2 View  Result Date: 11/30/2018 CLINICAL DATA:  Cough, chest congestion, chills, nausea and vomiting for the past 6 days. History of right pulmonary nodule and AIDS. Smoker. Anal cancer. EXAM: CHEST - 2 VIEW COMPARISON:  PET-CT dated 04/24/2018. FINDINGS: Mild patchy opacity in the right upper lobe, both lower lobes and right middle lobe/lingula. The lungs remain mildly hyperexpanded with mild peribronchial thickening. Unremarkable bones. IMPRESSION: 1. Mild bilateral multilobar pneumonia. 2. Stable mild changes of COPD and chronic bronchitis. Electronically Signed   By: Claudie Revering  M.D.   On: 11/30/2018 18:15   Dg Chest Port 1 View  Result Date: 12/02/2018 CLINICAL DATA:  48 year old male with history of right-sided lung cancer status post chemotherapy and radiation therapy. Shortness of breath. EXAM: PORTABLE CHEST 1 VIEW COMPARISON:  Chest x-ray 11/30/2018. FINDINGS: The patchy ill-defined areas of airspace disease noted in the lungs on the prior examination have improve slightly, particularly in the right upper lobe. There continues to be some ill-defined opacities throughout the lung bases bilaterally, compatible with residual bronchopneumonia. Mild emphysematous changes. No pleural effusions. No evidence of pulmonary edema. Heart size is normal. Upper mediastinal contours are within normal limits. IMPRESSION: 1. Slight improvement in multilobar bronchopneumonia, as above. 2. Emphysema. Electronically Signed   By: Vinnie Langton M.D.   On: 12/02/2018 10:13    Subjective: Seen and examined at bedside and states he is feeling better.  Still coughing up some sputum. No Nausea or Vomiting. No other concerns or complaints at this time.   Discharge Exam: Vitals:   12/01/18 2152 12/02/18 0512  BP: 101/66 (!) 101/59  Pulse: 67 85  Resp: 18 18  Temp: 97.7 F (36.5 C) (!) 97.3 F (36.3 C)  SpO2: 100% 100%   Vitals:   12/01/18 1728 12/01/18 2152 12/02/18 0100 12/02/18 0512  BP: 106/69 101/66  (!) 101/59  Pulse: 77 67  85  Resp: 19 18  18   Temp: 99 F (37.2 C) 97.7 F (36.5 C)  (!) 97.3 F (36.3 C)  TempSrc: Oral Oral  Oral  SpO2: 99% 100%  100%  Weight:   60.2 kg   Height:       General: Pt is alert, awake, not in acute distress Cardiovascular: RRR, S1/S2 +, no rubs, no gallops Respiratory: Diminished bilaterally, no wheezing, no rhonchi Abdominal: Soft, NT, ND, bowel sounds + Extremities: no edema, no cyanosis  The results of significant diagnostics from this hospitalization (including imaging, microbiology, ancillary and laboratory) are listed below for  reference.    Microbiology: Recent Results (from the past 240 hour(s))  Culture, blood (routine x 2) Call MD if unable to obtain prior to antibiotics being given     Status: None (Preliminary result)   Collection Time: 11/30/18  7:02 PM  Result Value Ref Range Status   Specimen Description BLOOD BLOOD LEFT WRIST  Final   Special Requests   Final    BOTTLES DRAWN AEROBIC AND ANAEROBIC Blood Culture adequate volume   Culture   Final    NO GROWTH < 24 HOURS Performed at [facility], [address], [place], [place] [address]    Report Status PENDING  Incomplete  Culture, blood (routine x 2) Call MD if unable to obtain prior to antibiotics being given     Status: None (Preliminary result)  Collection Time: 11/30/18  7:07 PM  Result Value Ref Range Status   Specimen Description BLOOD BLOOD LEFT FOREARM  Final   Special Requests   Final    BOTTLES DRAWN AEROBIC AND ANAEROBIC Blood Culture adequate volume   Culture   Final    NO GROWTH < 24 HOURS Performed at [facility], [address] [place], [place] [address]    Report Status PENDING  Incomplete    Labs: BNP (last 3 results) No results for input(s): BNP in the last 8760 hours. Basic Metabolic Panel: Recent Labs  Lab 11/30/18 1740 12/01/18 0722 12/02/18 0503  NA 135 138 138  K 3.6 3.9 3.7  CL 102 104 104  CO2 20* 25 24  GLUCOSE 129* 106* 100*  BUN 13 12 14   CREATININE 1.12 1.04 1.01  CALCIUM 8.2* 8.4* 8.4*  MG  --  2.2 2.2  PHOS  --  2.2* 2.5   Liver Function Tests: Recent Labs  Lab 11/30/18 1740 12/02/18 0503  AST 24 17  ALT 33 28  ALKPHOS 56 55  BILITOT 0.7 0.6  PROT 6.8 6.6  ALBUMIN 3.2* 2.7*   Recent Labs  Lab 12/01/18 0722  LIPASE 18   No results for input(s): AMMONIA in the last 168 hours. CBC: Recent Labs  Lab 11/30/18 1740 12/01/18 0722 12/02/18 0503  WBC 7.1 8.7 8.1  NEUTROABS 6.2  --  6.0  HGB 12.5* 13.1 13.4  HCT 38.3* 39.7 40.3  MCV 90.5 90.2 90.2  PLT 147* 170 225    Cardiac Enzymes: Recent Labs  Lab 12/01/18 0722  TROPONINI <0.03   BNP: Invalid input(s): POCBNP CBG: No results for input(s): GLUCAP in the last 168 hours. D-Dimer No results for input(s): DDIMER in the last 72 hours. Hgb A1c No results for input(s): HGBA1C in the last 72 hours. Lipid Profile No results for input(s): CHOL, HDL, LDLCALC, TRIG, CHOLHDL, LDLDIRECT in the last 72 hours. Thyroid function studies No results for input(s): TSH, T4TOTAL, T3FREE, THYROIDAB in the last 72 hours.  Invalid input(s): FREET3 Anemia work up Recent Labs    12/01/18 0722  VITAMINB12 174*  FOLATE 9.4  FERRITIN 175  TIBC 207*  IRON 16*  RETICCTPCT 0.5   Urinalysis    Component Value Date/Time   COLORURINE AMBER (A) 12/01/2018 1007   APPEARANCEUR CLEAR 12/01/2018 1007   LABSPEC 1.027 12/01/2018 1007   PHURINE 6.0 12/01/2018 1007   GLUCOSEU NEGATIVE 12/01/2018 1007   HGBUR NEGATIVE 12/01/2018 1007   BILIRUBINUR NEGATIVE 12/01/2018 1007   KETONESUR NEGATIVE 12/01/2018 1007   PROTEINUR 100 (A) 12/01/2018 1007   NITRITE NEGATIVE 12/01/2018 1007   LEUKOCYTESUR NEGATIVE 12/01/2018 1007   Sepsis Labs Invalid input(s): PROCALCITONIN,  WBC,  LACTICIDVEN Microbiology Recent Results (from the past 240 hour(s))  Culture, blood (routine x 2) Call MD if unable to obtain prior to antibiotics being given     Status: None (Preliminary result)   Collection Time: 11/30/18  7:02 PM  Result Value Ref Range Status   Specimen Description BLOOD BLOOD LEFT WRIST  Final   Special Requests   Final    BOTTLES DRAWN AEROBIC AND ANAEROBIC Blood Culture adequate volume   Culture   Final    NO GROWTH < 24 HOURS Performed at [facility], [place] [address], [place], [place] [address]    Report Status PENDING  Incomplete  Culture, blood (routine x 2) Call MD if unable to obtain prior to antibiotics being given  Status: None (Preliminary result)   Collection Time: 11/30/18  7:07 PM  Result Value  Ref Range Status   Specimen Description BLOOD BLOOD LEFT FOREARM  Final   Special Requests   Final    BOTTLES DRAWN AEROBIC AND ANAEROBIC Blood Culture adequate volume   Culture   Final    NO GROWTH < 24 HOURS Performed at [facility], [address], [place], [place] [address]    Report Status PENDING  Incomplete   Time coordinating discharge: 35 minutes  SIGNED:  Kerney Elbe, DO [facility] 12/02/2018, 12:36 PM Pager is on [place]  If 7PM-7AM, please contact night-coverage [email or web address] Password [email or web address]

## 2018-12-02 NOTE — Progress Notes (Signed)
SATURATION QUALIFICATIONS: (This note is used to comply with regulatory documentation for home oxygen)  Patient Saturations on Room Air at Rest = 99%  Patient Saturations on Room Air while Ambulating = 99%   

## 2018-12-03 LAB — LEGIONELLA PNEUMOPHILA SEROGP 1 UR AG: L. pneumophila Serogp 1 Ur Ag: NEGATIVE

## 2018-12-03 MED FILL — OSELTAMIVIR PHOSPHATE 75 MG: 75 | 3 days supply | Qty: 7 | Fill #0

## 2018-12-03 MED FILL — levoFLOXacin 750 MG TABS: 750 | 5 days supply | Qty: 5 | Fill #0

## 2018-12-03 MED FILL — SULFAMETHOXAZOLE-TMP DS TAB: 800-160 | 70 days supply | Qty: 30 | Fill #0

## 2018-12-03 NOTE — Consult Note (Signed)
       [facility] CM Primary Care Navigator  12/03/2018  Juan Harrison 1971/12/02 [id number]   Attempt to see patient at the bedsideto identify possible discharge needs buthe was alreadydischargedhome.    PerMD note, patient presented with fatigue, weakness, productive cough, nausea, vomiting, fever, chills (flu-like symptoms) for a week.  (CAP- community acquired pneumonia, influenza B, COPD with emphysema, normocyticnormochromicanemia, hypophosphatemia, hyperglycemia)  Patient hasdischarge instruction to follow-up withprimary care provider and Infectious Disease in 1- 2 weeks.   For additional questions please contact:  Edwena Felty A. Tasmia Blumer, BSN, RN-BC [facility] PRIMARY CARE Navigator Cell: [phone]

## 2018-12-05 LAB — CULTURE, BLOOD (ROUTINE X 2)
CULTURE: NO GROWTH
Culture: NO GROWTH
SPECIAL REQUESTS: ADEQUATE
SPECIAL REQUESTS: ADEQUATE

## 2018-12-28 ENCOUNTER — Other Ambulatory Visit: Payer: Self-pay | Admitting: Nurse Practitioner

## 2018-12-28 ENCOUNTER — Encounter: Payer: Self-pay | Admitting: Hematology

## 2019-01-18 ENCOUNTER — Other Ambulatory Visit: Payer: Medicare Other

## 2019-01-18 DIAGNOSIS — Z79899 Other long term (current) drug therapy: Secondary | ICD-10-CM

## 2019-01-18 DIAGNOSIS — B20 Human immunodeficiency virus [HIV] disease: Secondary | ICD-10-CM | POA: Diagnosis not present

## 2019-01-18 DIAGNOSIS — Z113 Encounter for screening for infections with a predominantly sexual mode of transmission: Secondary | ICD-10-CM

## 2019-01-20 LAB — LIPID PANEL
Cholesterol: 94 mg/dL (ref <200)
HDL: 36 mg/dL — ABNORMAL LOW (ref >=40)
LDL Cholesterol (Calc): 42 mg/dL (calc)
Non-HDL Cholesterol (Calc): 58 mg/dL (calc) (ref <130)
Total CHOL/HDL Ratio: 2.6 (calc) (ref <5.0)
Triglycerides: 81 mg/dL (ref <150)

## 2019-01-20 LAB — COMPREHENSIVE METABOLIC PANEL
AG RATIO: 1.4 (calc) (ref 1.0–2.5)
ALT: 28 U/L (ref 9–46)
AST: 27 U/L (ref 10–40)
Albumin: 3.8 g/dL (ref 3.6–5.1)
Alkaline phosphatase (APISO): 95 U/L (ref 36–130)
BUN: 14 mg/dL (ref 7–25)
CO2: 30 mmol/L (ref 20–32)
Calcium: 8.7 mg/dL (ref 8.6–10.3)
Chloride: 105 mmol/L (ref 98–110)
Creat: 1.1 mg/dL (ref 0.60–1.35)
GLOBULIN: 2.8 g/dL (ref 1.9–3.7)
Glucose, Bld: 86 mg/dL (ref 65–99)
Potassium: 4 mmol/L (ref 3.5–5.3)
Sodium: 139 mmol/L (ref 135–146)
Total Bilirubin: 0.3 mg/dL (ref 0.2–1.2)
Total Protein: 6.6 g/dL (ref 6.1–8.1)

## 2019-01-20 LAB — CBC
HCT: 36.3 % — ABNORMAL LOW (ref 38.5–50.0)
Hemoglobin: 12.1 g/dL — ABNORMAL LOW (ref 13.2–17.1)
MCH: 30.1 pg (ref 27.0–33.0)
MCHC: 33.3 g/dL (ref 32.0–36.0)
MCV: 90.3 fL (ref 80.0–100.0)
MPV: 10.5 fL (ref 7.5–12.5)
PLATELETS: 259 10*3/uL (ref 140–400)
RBC: 4.02 10*6/uL — ABNORMAL LOW (ref 4.20–5.80)
RDW: 14 % (ref 11.0–15.0)
WBC: 3.7 10*3/uL — ABNORMAL LOW (ref 3.8–10.8)

## 2019-01-20 LAB — T-HELPER CELL (CD4) - (RCID CLINIC ONLY)
CD4 % Helper T Cell: 28 % — ABNORMAL LOW (ref 33–55)
CD4 T CELL ABS: 310 /uL — AB (ref 400–2700)

## 2019-01-20 LAB — RPR: RPR: NONREACTIVE

## 2019-01-20 LAB — HIV-1 RNA QUANT-NO REFLEX-BLD
HIV 1 RNA Quant: <20 copies/mL — AB
HIV-1 RNA Quant, Log: <1.3 Log copies/mL — AB

## 2019-01-26 MED FILL — GENVOYA TABLET: 150-150-200 | 30 days supply | Qty: 30 | Fill #3

## 2019-02-01 ENCOUNTER — Ambulatory Visit: Payer: Medicare Other | Admitting: Infectious Diseases

## 2019-03-01 MED FILL — GENVOYA TABLET: 150-150-200 | 30 days supply | Qty: 30 | Fill #4

## 2019-03-23 DIAGNOSIS — M24575 Contracture, left foot: Secondary | ICD-10-CM | POA: Diagnosis not present

## 2019-03-23 DIAGNOSIS — M7989 Other specified soft tissue disorders: Secondary | ICD-10-CM | POA: Diagnosis not present

## 2019-03-23 MED FILL — predniSONE 5 MG TABS: 5 | 12 days supply | Qty: 42 | Fill #0

## 2019-04-03 MED FILL — GENVOYA TABLET: 150-150-200 | 30 days supply | Qty: 30 | Fill #5

## 2019-08-24 ENCOUNTER — Telehealth: Payer: Self-pay

## 2019-08-25 ENCOUNTER — Other Ambulatory Visit: Payer: Self-pay | Admitting: *Deleted

## 2019-08-25 NOTE — Patient Outreach (Signed)
[facility]) Care Management  08/25/2019  Juan Harrison 07/24/1971 [id number]    Unsuccessful telephone assessment with pt as call ended abruptly. RN attempted to call back however only able to leave a voice message with this pt.  Will send outreach letter and attempted the third outreach within the next 4 business days.   Raina Mina, RN Care Management Coordinator [place] Office [phone]

## 2019-08-30 ENCOUNTER — Other Ambulatory Visit: Payer: Self-pay | Admitting: *Deleted

## 2019-08-30 NOTE — Patient Outreach (Addendum)
[facility]) Care Management  08/30/2019  KIMAR SALEE October 11, 1971 [id number]  Telephone Assessment-Enrollment (prevention measures)  Referral received-08/24/2019  RN spoke with pt today and verified identifiers. RN inquired further on the pending referral for pharmacy needs. Pt agreed with the referral as RN inquired on any other needs. Pt states he has not contacted his primary care office yet but will call. RN further offered to call and inquired on the time and dates pt's wishes to visit with his primary provider. Pt states anytime however prefers after noon appointments. RN offered to follow up once the appointments time can been arranged (pt receptive). Inquired on any other needs at this time (denied). Based upon a brief conversation on prevention measures in maintaining his health such has visits with his providers pt agreed to ongoing [facility] services for enrollment today. Pt vulnerable to a high risk for acute issues due to his medical history. Based upon pt's high risk will place in a prevention program and monitor closely for available resources to assist.   Will base the plan of care on the referral for [place], pending medical appointment and other prevention measures to avoid possible risk of hospitalization. Will obtain additional information from this pt on a follow up call next week and attempt to complete the initial assessment (pt receptive). Will alert provider's office of pt enrollment and send [facility] with [facility] information.   [facility] CM Care Plan Problem One     Most Recent Value  Care Plan Problem One  Medication issues r/t financial hardship  Role Documenting the Problem One  Care Management Coordinator  Care Plan for Problem One  Active  THN CM Short Term Goal #1   Pt will be receptive to the [facility] referral for assistance wth his mdications with in the next 30 days.  THN CM Short Term Goal #1 Start Date  08/30/19  Interventions for Short  Term Goal #1  Discussed pt being receptive to available assistance with the [place] concerning the affordability on his medications    [facility] CM Care Plan Problem Two     Most Recent Value  Care Plan Problem Two  Medical appointments  Role Documenting the Problem Two  Care Management [place] for Problem Two  Active  THN CM Short Term Goal #1   Adherence with all his medical appointments with in the next 30 days.  THN CM Short Term Goal #1 Start Date  08/30/19  Interventions for Short Term Goal #2   Discussed and offered assistance with arranging an appointment with his primary provider and verified pt has sufficient transportation to any sheduled appointments.    THN CM Care Plan Problem Three     Most Recent Value  Care Plan Problem Three  Avoid acute events and issues with discussion of prevention measurs to be put in place.  Role Documenting the Problem Three  Care Management Coordinator  Care Plan for Problem Three  Active  THN Long Term Goal   Pt will be proactive with prevention measures to avoid hospitalization and acute medical issuses related over the next 90 days.  THN Long Term Goal Start Date  08/30/19  Interventions for Problem Three Long Term Goal  Discussion on how to prevent acute events with pt's participation with attending all medical appointments, taking all prescibed medicaitons and prevention measures in place with acute events on what to do if enocuntered.      Raina Mina, RN Care Management  [place] Office [phone]

## 2019-08-31 ENCOUNTER — Telehealth: Payer: Self-pay | Admitting: Pharmacist

## 2019-08-31 NOTE — Patient Outreach (Signed)
[facility] [facility]) Care Management  08/31/2019  Juan Harrison 09-Oct-1971 [id number]   Patient was called regarding medication assistance per referral. Unfortunately, he did not answer the phone. HIPAA compliant message was left on his voicemail.    From chart review, the patient is HIV and Hep B positive.  [place] was called. The Pharmacy Technician said the patient's medications cost $3.90. [name] was last filled 04/03/2019 with a $3.90 copay.   The [place] deactivated the patient's account 05/31/2019 due to inability to contact.  Patient will most likely need to re-establish with his infectious disease provider, have labs done and obtain new prescriptions so he can be reactivated with the specialty pharmacy.   Spoke with [place], Raina Mina.    Patient has an appointment scheduled 09/06/2019 for lab work and 09/27/2019 for an appointment.  Plan: Send patient an unsuccessful outreach letter. Call patient back in 7-10 business days.   Elayne Guerin, PharmD, [place] Clinical Pharmacist [phone]

## 2019-09-01 ENCOUNTER — Emergency Department (HOSPITAL_COMMUNITY)
Admission: EM | Admit: 2019-09-01 | Discharge: 2019-09-01 | Disposition: A | Discharge status: Home / Self Care | Payer: Medicare Other | Attending: Emergency Medicine | Admitting: Emergency Medicine

## 2019-09-01 ENCOUNTER — Other Ambulatory Visit: Payer: Self-pay

## 2019-09-01 ENCOUNTER — Encounter (HOSPITAL_COMMUNITY): Payer: Self-pay | Admitting: Emergency Medicine

## 2019-09-01 ENCOUNTER — Emergency Department (HOSPITAL_COMMUNITY): Payer: Medicare Other

## 2019-09-01 DIAGNOSIS — N182 Chronic kidney disease, stage 2 (mild): Secondary | ICD-10-CM | POA: Diagnosis not present

## 2019-09-01 DIAGNOSIS — Z79899 Other long term (current) drug therapy: Secondary | ICD-10-CM | POA: Insufficient documentation

## 2019-09-01 DIAGNOSIS — M5441 Lumbago with sciatica, right side: Secondary | ICD-10-CM

## 2019-09-01 DIAGNOSIS — F17210 Nicotine dependence, cigarettes, uncomplicated: Secondary | ICD-10-CM | POA: Insufficient documentation

## 2019-09-01 DIAGNOSIS — B20 Human immunodeficiency virus [HIV] disease: Secondary | ICD-10-CM | POA: Diagnosis not present

## 2019-09-01 DIAGNOSIS — M545 Low back pain: Secondary | ICD-10-CM | POA: Diagnosis not present

## 2019-09-01 DIAGNOSIS — Z85048 Personal history of other malignant neoplasm of rectum, rectosigmoid junction, and anus: Secondary | ICD-10-CM | POA: Diagnosis not present

## 2019-09-01 MED ORDER — MORPHINE SULFATE (PF) 4 MG/ML IV SOLN
4.0000 mg | Freq: Once | INTRAVENOUS | Status: AC
  Administered 2019-09-01: 4 mg via INTRAVENOUS
  Filled 2019-09-01: qty 1

## 2019-09-01 MED ORDER — DEXAMETHASONE SODIUM PHOSPHATE 10 MG/ML IJ SOLN
10.0000 mg | Freq: Once | INTRAMUSCULAR | Status: AC
  Administered 2019-09-01: 10:00:00 10 mg via INTRAVENOUS
  Filled 2019-09-01: qty 1

## 2019-09-01 MED ORDER — NAPROXEN 500 MG PO TABS: 500.0000 mg | ORAL_TABLET | Freq: Two times a day (BID) | ORAL | 0 refills | Status: DC

## 2019-09-01 MED ORDER — METHOCARBAMOL 500 MG PO TABS: 500.0000 mg | ORAL_TABLET | Freq: Every evening | ORAL | 0 refills | Status: DC | PRN

## 2019-09-01 MED ORDER — KETOROLAC TROMETHAMINE 15 MG/ML IJ SOLN
15.0000 mg | Freq: Once | INTRAMUSCULAR | Status: AC
  Administered 2019-09-01: 10:00:00 15 mg via INTRAVENOUS
  Filled 2019-09-01: qty 1

## 2019-09-01 MED ORDER — LIDOCAINE 5 % EX PTCH
1.0000 | MEDICATED_PATCH | CUTANEOUS | Status: DC
  Administered 2019-09-01: 10:00:00 1 via TRANSDERMAL
  Filled 2019-09-01: qty 1

## 2019-09-01 NOTE — Discharge Instructions (Signed)
Take naproxen 2 times a day with meals.  Do not take other anti-inflammatories at the same time (Advil, Motrin, ibuprofen, Aleve). You may supplement with Tylenol if you need further pain control. Use Robaxin as needed for muscle stiffness or soreness. Have caution, as this may make you tired or groggy. Do not drive or operate heavy machinery while taking this medication.  Use muscle creams (bengay, icy hot, salonpas) as needed for pain.  Follow up with your primary care doctor if pain is not improving with this treatment in 2 weeks.  Return to the ER if you develop high fevers, numbness, loss of bowel or bladder control, or any new or concerning symptoms.   

## 2019-09-01 NOTE — ED Triage Notes (Signed)
C/o back pain radiating down right leg to right hip- started Sunday, no known injury

## 2019-09-01 NOTE — ED Notes (Signed)
Pt ambulated back with sort tech

## 2019-09-01 NOTE — ED Notes (Signed)
Patient transported to CT 

## 2019-09-01 NOTE — ED Provider Notes (Signed)
[place] EMERGENCY DEPARTMENT Provider Note   CSN: [id number] Arrival date & time: 09/01/19  0751     History   Chief Complaint Chief Complaint  Patient presents with  . Back Pain    HPI Juan Harrison is a 48 y.o. male presenting for evaluation of back pain.  Patient states 3 days ago he was lifting something heavy and when he stood up, he had acute onset right low back pain.  Since then, his pain is been constant.  Pain is worse with movement and ambulation.  It radiates down the back of his right leg.  He denies fall or trauma.  He denies fevers, chills, nausea, vomiting, abdominal pain, urinary symptoms, loss of bowel bladder control, numbness, tingling.  He has taken 400 mg of ibuprofen yesterday without improvement of symptoms.  He has not tried anything else.  He denies previous history of back problems.  He has a history of HIV/AIDS for which he takes medication.  He states he has been taking his medication as prescribed.  He has not followed up regularly with his doctor, has an appointment next week.  Patient states he takes no other medications daily.  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     HPI  Past Medical History:  Diagnosis Date  . AIDS [facility]) infectious disease--- dr hatcher   dx 2007,  usCD4,  count at 110  . Anal cancer [facility]) oncologist-  dr feng/  dr moody   intial dx 12-10-2016--  Invasive Squamous Cell carcinoma in situ, Stage IIIA [id number]) --- completed concurrent radiation and chemotherapy (rxt 01-27-2017 chemo 01-24-2017)    . Anal condyloma   . Hepatitis B surface antigen positive   . History of cancer chemotherapy 12-16-2016 to 01-24-2017  . History of radiation therapy 12-16-2016 to 01-27-2017   55Gy in 30 sessions  . Pulmonary nodule, right    per Chest CT 12-06-2008  . Rectal mass    anal    Patient Active Problem List   Diagnosis Date Noted  . CAP (community acquired pneumonia)  11/30/2018  . CKD (chronic kidney disease) stage 2, GFR 60-89 ml/min 04/28/2018  . Herpes genitalis in men 02/12/2018  . Tobacco use 10/13/2017  . PICC (peripherally inserted central catheter) in place 01/07/2017  . Anal cancer ([place]) 12/10/2016  . Hepatitis B immune 10/29/2016  . AIDS ([place]) 12/02/2005    Past Surgical History:  Procedure Laterality Date  . IR GENERIC HISTORICAL  12/16/2016   IR US GUIDE VASC ACCESS RIGHT 12/16/2016 Arne Cleveland, MD [facility]-INTERV RAD  . IR GENERIC HISTORICAL  12/16/2016   IR FLUORO GUIDE CV LINE RIGHT 12/16/2016 Arne Cleveland, MD [facility]-INTERV RAD  . RECTAL BIOPSY N/A 11/29/2016   Procedure: BIOPSY ANAL CANAL MASS;  Surgeon: Leighton Ruff, MD;  Location: [place];  Service: General;  Laterality: N/A;  . RECTAL EXAM UNDER ANESTHESIA N/A 11/29/2016   Procedure: EXAM UNDER ANESTHESIA;  Surgeon: Leighton Ruff, MD;  Location: [place];  Service: General;  Laterality: N/A;  . RECTAL EXAM UNDER ANESTHESIA N/A 08/01/2017   Procedure: ANAL EXAM UNDER ANESTHESIA WITH BIOPSY;  Surgeon: Leighton Ruff, MD;  Location: [place];  Service: General;  Laterality: N/A;  . 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  Authorizing Provider  elvitegravir-cobicistat-emtricitabine-tenofovir (GENVOYA) 150-150-200-10 MG TABS tablet Take 1 tablet by mouth daily with breakfast. 08/24/18   Kuppelweiser, Cassie L, RPH-CPP  levofloxacin (LEVAQUIN) 750 MG tablet Take 1 tablet (750 mg total) by mouth daily. 12/02/18   Raiford Noble Latif, DO  methocarbamol (ROBAXIN) 500 MG tablet Take 1 tablet (500 mg total) by mouth at bedtime as needed for muscle spasms. 09/01/19   Makyiah Lie, PA-C  naproxen (NAPROSYN) 500 MG tablet Take 1 tablet (500 mg total) by mouth 2 (two) times daily with a meal. 09/01/19   Colm Lyford, PA-C  oseltamivir  (TAMIFLU) 75 MG capsule Take 1 capsule (75 mg total) by mouth 2 (two) times daily. 12/02/18   Sheikh, Georgina Quint Latif, DO  sulfamethoxazole-trimethoprim (BACTRIM DS,SEPTRA DS) 800-160 MG tablet Take 1 tablet by mouth 3 (three) times a week. 12/02/18   Kerney Elbe, DO    Family History Family History  Problem Relation Age of Onset  . COPD Mother   . Asthma Brother     Social History Social History   Tobacco Use  . Smoking status: Current Every Day Smoker    Packs/day: 0.25    Years: 20.00    Pack years: 5.00    Types: Cigarettes    Last attempt to quit: 10/13/2016    Years since quitting: 2.8  . Smokeless tobacco: Never Used  . Tobacco comment: want the patch  Substance Use Topics  . Alcohol use: No    Alcohol/week: 1.0 standard drinks    Types: 1 Standard drinks or equivalent per week    Comment: previously drank alcohol on weekends for 25 years, he quit on 10/14/2016   . Drug use: Yes    Types: Marijuana     Allergies   Patient has no known allergies.   Review of Systems Review of Systems  Musculoskeletal: Positive for back pain.  Neurological: Negative for numbness.  All other systems reviewed and are negative.    Physical Exam Updated Vital Signs BP 117/76 (BP Location: Left Arm)   Pulse (!) 57   Temp 97.6 F (36.4 C) (Oral)   Resp 16   SpO2 100%   Physical Exam Vitals signs and nursing note reviewed.  Constitutional:      General: He is not in acute distress.    Appearance: He is well-developed.     Comments: Appears older than stated age.  Thin, on the verge of being cachectic  HENT:     Head: Normocephalic and atraumatic.  Eyes:     Extraocular Movements: Extraocular movements intact.     Conjunctiva/sclera: Conjunctivae normal.     Pupils: Pupils are equal, round, and reactive to light.  Neck:     Musculoskeletal: Normal range of motion and neck supple.  Cardiovascular:     Rate and Rhythm: Normal rate and regular rhythm.     Pulses:  Normal pulses.  Pulmonary:     Effort: Pulmonary effort is normal. No respiratory distress.     Breath sounds: Normal breath sounds. No wheezing.  Abdominal:     General: There is no distension.     Palpations: Abdomen is soft. There is no mass.     Tenderness: There is no abdominal tenderness. There is no guarding or rebound.  Musculoskeletal: Normal range of motion.     Comments: Tenderness palpation of right low back musculature.  Also pain over midline spine.  No pain on the left side.  No step-offs or deformities.  No erythema, warmth,  or vesicles.  Pedal pulses intact bilaterally.  Good distal sensation bilaterally.  No saddle paresthesias.  Able to move lower extremities with pain, no focal weakness.  Skin:    General: Skin is warm and dry.     Capillary Refill: Capillary refill takes less than 2 seconds.  Neurological:     Mental Status: He is alert and oriented to person, place, and time.      ED Treatments / Results  Labs (all labs ordered are listed, but only abnormal results are displayed) Labs Reviewed - No data to display  EKG None  Radiology Ct Lumbar Spine Wo Contrast  Result Date: 09/01/2019 CLINICAL DATA:  Pain EXAM: CT LUMBAR SPINE WITHOUT CONTRAST TECHNIQUE: Multidetector CT imaging of the lumbar spine was performed without intravenous contrast administration. Multiplanar CT image reconstructions were also generated. COMPARISON:  None. FINDINGS: Segmentation: 5 lumbar type vertebrae. There are rudimentary ribs at the T12 level. Alignment: Normal. Vertebrae: No acute fracture or focal pathologic process. Paraspinal and other soft tissues: Negative. Disc levels: The disc heights are relatively well preserved. IMPRESSION: Normal study. Electronically Signed   By: Constance Holster M.D.   On: 09/01/2019 10:06    Procedures Procedures (including critical care time)  Medications Ordered in ED Medications  lidocaine (LIDODERM) 5 % 1 patch (1 patch Transdermal Patch  Applied 09/01/19 1019)  morphine 4 MG/ML injection 4 mg (4 mg Intravenous Given 09/01/19 0858)  dexamethasone (DECADRON) injection 10 mg (10 mg Intravenous Given 09/01/19 1025)  ketorolac (TORADOL) 15 MG/ML injection 15 mg (15 mg Intravenous Given 09/01/19 1024)     Initial Impression / Assessment and Plan / ED Course  I have reviewed the triage vital signs and the nursing notes.  Pertinent labs & imaging results that were available during my care of the patient were reviewed by me and considered in my medical decision making (see chart for details).        Patient presenting for evaluation of back pain.  Physical exam shows patient who is neurovascularly intact.  History consistent with MSK/sciatic back pain.  However, patient with high risk due to history of HIV/AIDS and history of anal cancer stage III.  As such, concern for possible mass versus compression fracture.  Patient without fever or signs of infectious disease.  Doubt discitis or epidural abscess.  Will obtain CT lumbar for further evaluation of the bones.  However if negative, likely MSK cause.  Case discussed with attending, Dr. Ralene Bathe evaluated the patient.  CT lumbar negative for acute findings.  Discussed with patient.  Discussed treatment with anti-inflammatories and muscle relaxers.  Patient has appoint with his primary care doctor next week, encouraged reassessment of symptoms at that time.  Strict return precautions given, including any signs of infection.  At this time, patient appears safe for discharge.  Return precautions given.  Patient states he understands and agrees to plan.   Final Clinical Impressions(s) / ED Diagnoses   Final diagnoses:  Acute right-sided low back pain with right-sided sciatica    ED Discharge Orders         Ordered    naproxen (NAPROSYN) 500 MG tablet  2 times daily with meals     09/01/19 1022    methocarbamol (ROBAXIN) 500 MG tablet  At bedtime PRN     09/01/19 1022            Rameen Gohlke, PA-C 09/01/19 1137    Quintella Reichert, MD 09/05/19 [phone]

## 2019-09-01 NOTE — ED Notes (Signed)
Pt states he bent over to pick something up and hurt his back.

## 2019-09-02 ENCOUNTER — Other Ambulatory Visit: Payer: Self-pay | Admitting: *Deleted

## 2019-09-02 NOTE — Patient Outreach (Signed)
[place] [facility]) Care Management  09/02/2019  KWAME CASTALDO [birth year] [id number]   Telephone Assessment  ED visit 9/30  RN followed up with pt today to inquire further on the recent ED visit on yesterday. Pt states has back issues and felt he need to go. Reports he has all medications and continue to have the pending appointment for labs next week and a primary care visit later in this month. No additional problems reported as pt reports resolution. RN inquired if this was a good time to talk to possible completed the initial assessment however pt with family and prefers to keep the scheduled appointments next Thursday for the needed assessment.   Plans to address update on the plan of care related to the goals and interventions. Will continue to offer any additional resources as needed to assist pt in managing his ongoing medical issues more independently with improvement on his self care. No other issues to address at this time. Will follow up accordingly.  Raina Mina, RN Care Management Coordinator [place] Office [phone]

## 2019-09-03 ENCOUNTER — Other Ambulatory Visit: Payer: Self-pay | Admitting: *Deleted

## 2019-09-03 DIAGNOSIS — B20 Human immunodeficiency virus [HIV] disease: Secondary | ICD-10-CM

## 2019-09-06 ENCOUNTER — Other Ambulatory Visit: Payer: Medicare Other

## 2019-09-06 ENCOUNTER — Other Ambulatory Visit: Payer: Self-pay

## 2019-09-06 DIAGNOSIS — B20 Human immunodeficiency virus [HIV] disease: Secondary | ICD-10-CM | POA: Diagnosis not present

## 2019-09-07 LAB — T-HELPER CELL (CD4) - (RCID CLINIC ONLY)
CD4 % Helper T Cell: 19 % — ABNORMAL LOW (ref 33–65)
CD4 T Cell Abs: 180 /uL — ABNORMAL LOW (ref 400–1790)

## 2019-09-08 ENCOUNTER — Other Ambulatory Visit: Payer: Self-pay | Admitting: Pharmacist

## 2019-09-08 LAB — COMPLETE METABOLIC PANEL WITH GFR
AG Ratio: 1.4 (calc) (ref 1.0–2.5)
ALT: 28 U/L (ref 9–46)
AST: 22 U/L (ref 10–40)
Albumin: 3.6 g/dL (ref 3.6–5.1)
Alkaline phosphatase (APISO): 65 U/L (ref 36–130)
BUN: 13 mg/dL (ref 7–25)
CO2: 27 mmol/L (ref 20–32)
Calcium: 8.6 mg/dL (ref 8.6–10.3)
Chloride: 107 mmol/L (ref 98–110)
Creat: 1.11 mg/dL (ref 0.60–1.35)
GFR, Est African American: 92 mL/min/{1.73_m2} (ref >=60)
GFR, Est Non African American: 79 mL/min/{1.73_m2} (ref >=60)
Globulin: 2.6 g/dL (calc) (ref 1.9–3.7)
Glucose, Bld: 116 mg/dL — ABNORMAL HIGH (ref 65–99)
Potassium: 4 mmol/L (ref 3.5–5.3)
Sodium: 139 mmol/L (ref 135–146)
Total Bilirubin: 0.4 mg/dL (ref 0.2–1.2)
Total Protein: 6.2 g/dL (ref 6.1–8.1)

## 2019-09-08 LAB — CBC WITH DIFFERENTIAL/PLATELET
Absolute Monocytes: 340 cells/uL (ref 200–950)
Basophils Absolute: 20 cells/uL (ref 0–200)
Basophils Relative: 0.6 %
Eosinophils Absolute: 178 cells/uL (ref 15–500)
Eosinophils Relative: 5.4 %
HCT: 37.5 % — ABNORMAL LOW (ref 38.5–50.0)
Hemoglobin: 12.4 g/dL — ABNORMAL LOW (ref 13.2–17.1)
Lymphs Abs: 997 cells/uL (ref 850–3900)
MCH: 29.7 pg (ref 27.0–33.0)
MCHC: 33.1 g/dL (ref 32.0–36.0)
MCV: 89.7 fL (ref 80.0–100.0)
MPV: 11.8 fL (ref 7.5–12.5)
Monocytes Relative: 10.3 %
Neutro Abs: 1766 cells/uL (ref 1500–7800)
Neutrophils Relative %: 53.5 %
Platelets: 174 10*3/uL (ref 140–400)
RBC: 4.18 10*6/uL — ABNORMAL LOW (ref 4.20–5.80)
RDW: 12.4 % (ref 11.0–15.0)
Total Lymphocyte: 30.2 %
WBC: 3.3 10*3/uL — ABNORMAL LOW (ref 3.8–10.8)

## 2019-09-08 LAB — HIV-1 RNA QUANT-NO REFLEX-BLD
HIV 1 RNA Quant: 18400 copies/mL — ABNORMAL HIGH
HIV-1 RNA Quant, Log: 4.26 Log copies/mL — ABNORMAL HIGH

## 2019-09-08 NOTE — Patient Outreach (Addendum)
[facility]) Care Management  09/08/2019  Juan Harrison 1971/06/25 [id number]   Patient was called regarding medication adherence and assistance. HIPAA identifiers were obtained.    From chart review, the patient is HIV and Hep B positive.  Juan Harrison was called. The Pharmacy Technician said the patient's medications cost $3.90. Juan Harrison was last filled 04/03/2019 with a $3.90 copay.   The [facility] deactivated the patient's account 05/31/2019 due to inability to contact.  Patient reported that hehas 10 days of Genvoya therapy left.  He had blood work done on 09/06/2019 and has an appointment with ID 09/27/2019.  Unfortunately, without a valid prescription, [facility] does not have any resources for the patient to help with Genvoya.  Patient said he was told the 09/27/2019 appointment was the first available appointment. Patient was encouraged to call his provider's office back and let them know he only has 10 days worth of medication and request to be placed on a waiting list for the next available cancellation.  Patient's medications were reviewed:  Medications Reviewed Today    Reviewed by Juan Harrison, [facility] (Pharmacist) on 09/08/19 at 1604  Med List Status: <None>  Medication Order Taking? Sig Documenting Provider Last Dose Status Informant  elvitegravir-cobicistat-emtricitabine-tenofovir (GENVOYA) 150-150-200-10 MG TABS tablet [id number] Yes Take 1 tablet by mouth daily with breakfast. Kuppelweiser, Cassie L, RPH-CPP Taking Active Self       Patient not taking:      Discontinued 09/08/19 1604 (Completed Course)   methocarbamol (ROBAXIN) 500 MG tablet [id number] Yes Take 1 tablet (500 mg total) by mouth at bedtime as needed for muscle spasms. Caccavale, Sophia, PA-C Taking Active   naproxen (NAPROSYN) 500 MG tablet [id number] Yes Take 1 tablet (500 mg total) by mouth 2 (two) times daily with a meal. Caccavale, Sophia, PA-C Taking  Active        Patient not taking:      Discontinued 09/08/19 1604 (Completed Course)   sulfamethoxazole-trimethoprim (BACTRIM DS,SEPTRA DS) 800-160 MG tablet [id number] No Take 1 tablet by mouth 3 (three) times a week.  Patient not taking: Reported on 09/08/2019   Juan Elbe, DO Not Taking Active           Plan: Follow up with patient after his ID visit.  Juan Harrison, PharmD, [place] Clinical Pharmacist [phone]

## 2019-09-09 ENCOUNTER — Encounter: Payer: Self-pay | Admitting: *Deleted

## 2019-09-09 ENCOUNTER — Other Ambulatory Visit: Payer: Self-pay | Admitting: *Deleted

## 2019-09-09 NOTE — Patient Outreach (Signed)
[facility]) Care Management  09/09/2019  Juan Harrison 1971/02/21 [id number]  Telephone Assessment  RN spoke with pt today and completed the initial assessment. Pt able to verify all pending appointments and states he has completed all requested labs by his provider's office. RN review all medications and encouraged pt to obtain needed refills on his pending appointment. Also verified pt has sufficient transport services to his appointments. Pt confirmed and indicated if needed he has used [facility] in the past. Rn also review and discussed the current plan of care in place along with goals and interventions. Will update accordingly based upon pt's progress. Stress the importance of adhering to this plan in order to avoid acute events. Pt receptive and agreed to this plan. No other request or questions at this time with no needs presented as RN will continue to encouraged adherence with the following goals of care.  Will plan to follow up next month to evaluate pt's goal of care.  THN CM Care Plan Problem One     Most Recent Value  Care Plan Problem One  Medication issues r/t financial hardship  Role Documenting the Problem One  Care Management Coordinator  Care Plan for Problem One  Active  THN CM Short Term Goal #1   Pt will be receptive to the [facility] referral for assistance wth his mdications with in the next 30 days.  THN CM Short Term Goal #1 Start Date  08/30/19  THN CM Short Term Goal #1 Met Date  09/09/19    [facility] CM Care Plan Problem Two     Most Recent Value  Care Plan Problem Two  Medical appointments  Role Documenting the Problem Two  Care Management Coordinator  Care Plan for Problem Two  Active  THN CM Short Term Goal #1   Adherence with all his medical appointments with in the next 30 days.  THN CM Short Term Goal #1 Start Date  08/30/19  Interventions for Short Term Goal #2   Verified scheduled pending appointments with pt's provider's. WIll extend to allow  adherence with his attendance. Will stress the importance of attending the scheduled appointment to prevent acute events and receive the needed medication refills.     THN CM Care Plan Problem Three     Most Recent Value  Care Plan Problem Three  Avoid acute events and issues with prevention measures in place.  Role Documenting the Problem Three  Care Management Coordinator  Care Plan for Problem Three  Active  THN Long Term Goal   Pt will be proactive with prevention measures to avoid hospitalization and acute medical issuses related over the next 90 days.  THN Long Term Goal Start Date  08/30/19  Interventions for Problem Three Long Term Goal  Will reiterate on the importance of avoid acute events with his adherence to the discussed prevention measures today. Will stress the importance of attending his appts and verify sufficient transportation. No other resources needed at this time for pt's ongoing adherence. Will re-evaluate next month on pt's compliance.       Raina Mina, RN Care Management Coordinator [place] Main Office [phone]  Raina Mina, RN Care Management Coordinator [place] Office [phone]

## 2019-09-27 ENCOUNTER — Other Ambulatory Visit: Payer: Self-pay

## 2019-09-27 ENCOUNTER — Encounter: Payer: Self-pay | Admitting: Infectious Diseases

## 2019-09-27 ENCOUNTER — Ambulatory Visit (INDEPENDENT_AMBULATORY_CARE_PROVIDER_SITE_OTHER): Payer: Medicare Other | Admitting: Infectious Diseases

## 2019-09-27 VITALS — BP 99/58 | HR 80 | Temp 98.3°F | Wt 141.8 lb

## 2019-09-27 DIAGNOSIS — N182 Chronic kidney disease, stage 2 (mild): Secondary | ICD-10-CM | POA: Diagnosis not present

## 2019-09-27 DIAGNOSIS — C210 Malignant neoplasm of anus, unspecified: Secondary | ICD-10-CM | POA: Diagnosis not present

## 2019-09-27 DIAGNOSIS — Z79899 Other long term (current) drug therapy: Secondary | ICD-10-CM

## 2019-09-27 DIAGNOSIS — B20 Human immunodeficiency virus [HIV] disease: Secondary | ICD-10-CM

## 2019-09-27 DIAGNOSIS — Z113 Encounter for screening for infections with a predominantly sexual mode of transmission: Secondary | ICD-10-CM | POA: Diagnosis not present

## 2019-09-27 MED ORDER — GENVOYA 150-150-200-10 MG PO TABS: 1.0000 | ORAL_TABLET | Freq: Every day | ORAL | 5 refills | Status: DC

## 2019-09-27 MED FILL — GENVOYA TABLET: 150-150-200 | 30 days supply | Qty: 30 | Fill #0

## 2019-09-27 NOTE — Assessment & Plan Note (Signed)
Will restart his genvoya.  Offered/refused condoms.  Will not start bactrim back as expect CD4 to improve quickly.  rtc in 9 months Refuses vax.

## 2019-09-27 NOTE — Assessment & Plan Note (Signed)
Will ask Dr Burr Medico and Dr Marcello Moores to see again with his episode of melena.  May need repeat screening.

## 2019-09-27 NOTE — Assessment & Plan Note (Signed)
Cr stable, will continue to watch.

## 2019-09-27 NOTE — Progress Notes (Signed)
   Subjective:    Patient ID: Juan Harrison, male    DOB: [birth year], 48 y.o.   MRN: [id number]  HPI 48yo M with hx of HIV/AIDS. Dx ~2007. ART hx- Darunavir-norvir/truvada/Epzicombactrim (Hx of non-compliance).  He was changed to genvoya 05-2016. He has been seen by [facility]/Onc/Rad Onc for anal cancer. Finished CTX and XRT 01-2017.   HIV 1 RNA Quant (copies/mL)  Date Value  09/06/2019 18,400 (H)  01/18/2019 <20 DETECTED (A)  04/28/2018 <20 DETECTED (A)   CD4 (no units)  Date Value  12/11/2016 See Separate Report   CD4 T Cell Abs (/uL)  Date Value  09/06/2019 180 (L)  01/18/2019 310 (L)  11/30/2018 100 (L)    Here today as he needs med refills.  Has been feeling well.  Had black BM a couple of weeks ago. Has not had Onc f/u in some time.   Review of Systems  Constitutional: Negative for appetite change and unexpected weight change.  HENT: Negative for mouth sores.   Respiratory: Negative for cough and shortness of breath.   Gastrointestinal: Negative for constipation and diarrhea.  Genitourinary: Negative for difficulty urinating.   Please see HPI. All other systems reviewed and negative.      Objective:   Physical Exam Constitutional:      Appearance: Normal appearance.  HENT:     Mouth/Throat:     Mouth: Mucous membranes are moist.     Pharynx: No oropharyngeal exudate.  Eyes:     Extraocular Movements: Extraocular movements intact.     Pupils: Pupils are equal, round, and reactive to light.  Neck:     Musculoskeletal: Normal range of motion and neck supple.  Cardiovascular:     Rate and Rhythm: Normal rate and regular rhythm.  Pulmonary:     Effort: Pulmonary effort is normal.     Breath sounds: Normal breath sounds.  Abdominal:     General: Bowel sounds are normal. There is no distension.     Palpations: Abdomen is soft.     Tenderness: There is no abdominal tenderness.  Musculoskeletal:     Right lower leg: No edema.     Left lower leg: No  edema.  Neurological:     General: No focal deficit present.     Mental Status: He is alert.  Psychiatric:        Mood and Affect: Mood normal.       Assessment & Plan:

## 2019-09-28 ENCOUNTER — Other Ambulatory Visit: Payer: Self-pay | Admitting: Pharmacist

## 2019-09-28 NOTE — Patient Outreach (Signed)
[place] [facility]) Care Management  09/28/2019  Juan Harrison 1971/10/21 [id number]   Patient was called to follow up on [name] patient assistance.HIPAA identifiers were obtained. Patient saw his ID provider yesterday and [name] was restarted. [place] was called to be sure the patient's medication was ready. Spoke with Aleene Davidson at [facility] who said the patient's [name] was ready and had a copay of $3.90.  Patient was called back and informed that his prescription was ready. Patient said he would pick up the prescription ASAP.  Elayne Guerin, PharmD, [place] Clinical Pharmacist [phone]

## 2019-10-06 MED FILL — GENVOYA TABLET: 150-150-200 | 30 days supply | Qty: 30 | Fill #0

## 2019-10-11 ENCOUNTER — Other Ambulatory Visit: Payer: Self-pay | Admitting: *Deleted

## 2019-10-11 NOTE — Patient Outreach (Addendum)
[place] [facility]) Care Management  10/11/2019  Juan Harrison [birth year] [id number]   Telephone Assessment-Successful-Preventive Measures  RN spoke with pt today and received an update ongoing his ongoing management of care. Pt reports he is doing well with no acute issues at this time. Pt verified sufficient refills on his medications and transportation to get to all scheduled appointments. Pt continue to report managing his care to avoid hospitalization and acute problems from occurring.  Will review and discuss pt's progress with the current plan of care in place and continue to encouraged adherence with prevention measures. Will continue to offer tools to assist with these goals and update the goals of care.   Plan: Will follow up next month with plans to re-evaluate pt's progress next month.     Raina Mina, RN Care Management Coordinator [place] Office [phone]

## 2019-11-10 ENCOUNTER — Other Ambulatory Visit: Payer: Self-pay | Admitting: *Deleted

## 2019-11-10 NOTE — Patient Outreach (Signed)
[name] [facility]) Care Management  11/10/2019  Juan Harrison 01-May-1971 [id number]  Telephone Assessment-Successful-Prevention Measures  RN spoke with pt today and received an update on pt's ongoing management of care. Pt states he is avoiding crowds and individuals that are sick. Reports recent death in the family as condolence offered with moral support. Further discussions on pt's ongoing plan of care concerning prevention measures to avoid hospitalizations. Pt confirms all medications taking according to how they are prescribed and pt has not had any appointments over the last month with no pending appointments.   Plan of care updated accordingly with discussion on quarterly follow up telephonic phones (pt receptive). Will update interventions and plan to follow up in 3 months as discussed. Pt aware to contact RN case manager with any needs or concerns.  [facility] CM Care Plan Problem Two     Most Recent Value  Care Plan for Problem Two  Not Active    [facility] CM Care Plan Problem Three     Most Recent Value  Care Plan Problem Three  Avoid acute events and issues with prevention measures in place.  Role Documenting the Problem Three  Care Management Coordinator  Care Plan for Problem Three  Active  [facility] Long Term Goal   Pt will be proactive with prevention measures to avoid hospitalization and acute medical issuses related over the next 90 days.  [facility] Long Term Goal Start Date  08/30/19  Interventions for Problem Three Long Term Goal  Will continue ot verify pt actively using prevention measures to avoid hospitalizations and acute problems from occurring. Will discussed and education on covid symptoms and what to do if encountered and to aovid individuals that are carriers of such symptoms due to his medical history.       Raina Mina, RN Care Management Coordinator [place] Office [phone]

## 2020-01-07 ENCOUNTER — Encounter: Payer: Self-pay | Admitting: *Deleted

## 2020-02-10 ENCOUNTER — Other Ambulatory Visit: Payer: Self-pay | Admitting: *Deleted

## 2020-02-10 ENCOUNTER — Telehealth: Payer: Self-pay

## 2020-02-10 NOTE — Patient Outreach (Signed)
[place] [facility]) Care Management  02/10/2020  Juan Harrison 01-22-1971 [id number]    Telephone Assessment-Successful Prevention Measures  RN spoke with pt today and received an update on his ongoing management of care. Pt reports he is avoid people that are sick with no reported ED visits or issues related to his health. Pt states he needs an annual doctor's appointment and refills on all his medications. RN offered to assist and contacted Dr. Algis Downs office with the request. Pt aware to be available via his phone for incoming call from his provider's office for these arrangements.   Plan of care updated accordingly with discussed interventions and goals. Adjusted accordingly based upon pt's progress and ongoing management of care. Provider continues to be updated accordingly on pt's disposition with [facility] services.  [facility] CM Care Plan Problem Three     Most Recent Value  Care Plan Problem Three  Avoid acute events and issues with prevention measures in place.  Role Documenting the Problem Three  Care Management Coordinator  Care Plan for Problem Three  Active  [facility] Long Term Goal   Pt will be proactive with prevention measures to avoid hospitalization and acute medical issuses related over the next 90 days.  [facility] Long Term Goal Start Date  08/30/19  Interventions for Problem Three Long Term Goal  Pt continue to proactive with managing his care with maintaining his appointments, medications and avoid riskful environments and individuals that may be harmful to a hospitalization. Will continue to encouraged adherence with lowering the risk and managing his care. by assisting  pt accordingly.      Raina Mina, RN Care Management Coordinator [place] Office [phone]

## 2020-02-10 NOTE — Telephone Encounter (Signed)
Left message on voicemail  for patient to call our office.  Dr [name] has asked that he come for office visit.   Laverle Patter, RN

## 2020-02-16 ENCOUNTER — Other Ambulatory Visit: Payer: Self-pay | Admitting: *Deleted

## 2020-02-16 DIAGNOSIS — Z91199 Patient's noncompliance with other medical treatment and regimen due to unspecified reason: Secondary | ICD-10-CM

## 2020-02-16 DIAGNOSIS — Z9119 Patient's noncompliance with other medical treatment and regimen: Secondary | ICD-10-CM

## 2020-02-16 DIAGNOSIS — B20 Human immunodeficiency virus [HIV] disease: Secondary | ICD-10-CM

## 2020-02-16 MED FILL — GENVOYA TABLET: 150-150-200 | 30 days supply | Qty: 30 | Fill #1

## 2020-02-16 NOTE — Telephone Encounter (Signed)
Patient called to schedule an appointment (labs 4/26, follow up 5/11 with Dr [name]).  He has had spotty adherence to his Genvoya, has 4 pills left.  Fills at [facility], but inconsistently. Landis Gandy, RN

## 2020-02-16 NOTE — Telephone Encounter (Signed)
Thanks

## 2020-02-16 NOTE — Addendum Note (Signed)
Addended by: Landis Gandy on: 02/16/2020 02:56 PM   Modules accepted: Orders

## 2020-02-16 NOTE — Addendum Note (Signed)
Addended by: Landis Gandy on: 02/16/2020 02:20 PM   Modules accepted: Orders

## 2020-03-15 MED FILL — GENVOYA TABLET: 150-150-200 | 30 days supply | Qty: 30 | Fill #2

## 2020-03-27 ENCOUNTER — Other Ambulatory Visit: Payer: Medicare Other

## 2020-03-27 ENCOUNTER — Other Ambulatory Visit: Payer: Self-pay

## 2020-03-27 DIAGNOSIS — Z91199 Patient's noncompliance with other medical treatment and regimen due to unspecified reason: Secondary | ICD-10-CM

## 2020-03-27 DIAGNOSIS — Z113 Encounter for screening for infections with a predominantly sexual mode of transmission: Secondary | ICD-10-CM | POA: Diagnosis not present

## 2020-03-27 DIAGNOSIS — Z79899 Other long term (current) drug therapy: Secondary | ICD-10-CM

## 2020-03-27 DIAGNOSIS — B20 Human immunodeficiency virus [HIV] disease: Secondary | ICD-10-CM

## 2020-03-27 DIAGNOSIS — Z9119 Patient's noncompliance with other medical treatment and regimen: Secondary | ICD-10-CM

## 2020-03-28 LAB — T-HELPER CELL (CD4) - (RCID CLINIC ONLY)
CD4 % Helper T Cell: 14 % — ABNORMAL LOW (ref 33–65)
CD4 T Cell Abs: 114 /uL — ABNORMAL LOW (ref 400–1790)

## 2020-04-01 ENCOUNTER — Emergency Department (HOSPITAL_COMMUNITY)
Admission: EM | Admit: 2020-04-01 | Discharge: 2020-04-02 | Disposition: A | Discharge status: Home / Self Care | Payer: Medicare Other | Attending: Emergency Medicine | Admitting: Emergency Medicine

## 2020-04-01 ENCOUNTER — Emergency Department (HOSPITAL_COMMUNITY): Payer: Medicare Other

## 2020-04-01 ENCOUNTER — Other Ambulatory Visit: Payer: Self-pay

## 2020-04-01 ENCOUNTER — Encounter (HOSPITAL_COMMUNITY): Payer: Self-pay | Admitting: Emergency Medicine

## 2020-04-01 DIAGNOSIS — Z5321 Procedure and treatment not carried out due to patient leaving prior to being seen by health care provider: Secondary | ICD-10-CM | POA: Diagnosis not present

## 2020-04-01 DIAGNOSIS — R0602 Shortness of breath: Secondary | ICD-10-CM | POA: Insufficient documentation

## 2020-04-01 DIAGNOSIS — Z20822 Contact with and (suspected) exposure to covid-19: Secondary | ICD-10-CM | POA: Insufficient documentation

## 2020-04-01 DIAGNOSIS — R0789 Other chest pain: Secondary | ICD-10-CM | POA: Diagnosis not present

## 2020-04-01 LAB — COMPREHENSIVE METABOLIC PANEL
ALT: 39 U/L (ref 0–44)
AST: 37 U/L (ref 15–41)
Albumin: 3.7 g/dL (ref 3.5–5.0)
Alkaline Phosphatase: 80 U/L (ref 38–126)
Anion gap: 14 (ref 5–15)
BUN: 17 mg/dL (ref 6–20)
CO2: 23 mmol/L (ref 22–32)
Calcium: 8.8 mg/dL — ABNORMAL LOW (ref 8.9–10.3)
Chloride: 100 mmol/L (ref 98–111)
Creatinine, Ser: 1.23 mg/dL (ref 0.61–1.24)
GFR calc Af Amer: >60 mL/min (ref >60)
GFR calc non Af Amer: >60 mL/min (ref >60)
Glucose, Bld: 108 mg/dL — ABNORMAL HIGH (ref 70–99)
Potassium: 4 mmol/L (ref 3.5–5.1)
Sodium: 137 mmol/L (ref 135–145)
Total Bilirubin: 0.7 mg/dL (ref 0.3–1.2)
Total Protein: 7.8 g/dL (ref 6.5–8.1)

## 2020-04-01 LAB — CBC WITH DIFFERENTIAL/PLATELET
Abs Immature Granulocytes: 0.04 10*3/uL (ref 0.00–0.07)
Basophils Absolute: 0 10*3/uL (ref 0.0–0.1)
Basophils Relative: 0 %
Eosinophils Absolute: 0.1 10*3/uL (ref 0.0–0.5)
Eosinophils Relative: 1 %
HCT: 40.6 % (ref 39.0–52.0)
Hemoglobin: 13.1 g/dL (ref 13.0–17.0)
Immature Granulocytes: 1 %
Lymphocytes Relative: 14 %
Lymphs Abs: 0.8 10*3/uL (ref 0.7–4.0)
MCH: 28.8 pg (ref 26.0–34.0)
MCHC: 32.3 g/dL (ref 30.0–36.0)
MCV: 89.2 fL (ref 80.0–100.0)
Monocytes Absolute: 0.6 10*3/uL (ref 0.1–1.0)
Monocytes Relative: 11 %
Neutro Abs: 4.2 10*3/uL (ref 1.7–7.7)
Neutrophils Relative %: 73 %
Platelets: 186 10*3/uL (ref 150–400)
RBC: 4.55 MIL/uL (ref 4.22–5.81)
RDW: 13.8 % (ref 11.5–15.5)
WBC: 5.7 10*3/uL (ref 4.0–10.5)
nRBC: 0 % (ref 0.0–0.2)

## 2020-04-01 LAB — TROPONIN I (HIGH SENSITIVITY): Troponin I (High Sensitivity): 3 ng/L (ref <18)

## 2020-04-01 NOTE — ED Triage Notes (Signed)
Pt c/o sob and chest tightness since yesterday. Denies any fever or chills.

## 2020-04-02 ENCOUNTER — Encounter (HOSPITAL_COMMUNITY): Payer: Self-pay

## 2020-04-02 ENCOUNTER — Ambulatory Visit (INDEPENDENT_AMBULATORY_CARE_PROVIDER_SITE_OTHER)
Admission: EM | Admit: 2020-04-02 | Discharge: 2020-04-02 | Disposition: A | Discharge status: Home / Self Care | Payer: Medicare Other | Source: Home / Self Care

## 2020-04-02 ENCOUNTER — Other Ambulatory Visit: Payer: Self-pay

## 2020-04-02 DIAGNOSIS — J069 Acute upper respiratory infection, unspecified: Secondary | ICD-10-CM

## 2020-04-02 DIAGNOSIS — R0602 Shortness of breath: Secondary | ICD-10-CM | POA: Diagnosis not present

## 2020-04-02 LAB — SARS CORONAVIRUS 2 (TAT 6-24 HRS): SARS Coronavirus 2: NEGATIVE

## 2020-04-02 MED ORDER — BENZONATATE 200 MG PO CAPS: 200.0000 mg | ORAL_CAPSULE | Freq: Three times a day (TID) | ORAL | 0 refills | Status: AC | PRN

## 2020-04-02 NOTE — ED Notes (Signed)
No answer x2 

## 2020-04-02 NOTE — ED Notes (Signed)
No answer x3

## 2020-04-02 NOTE — Discharge Instructions (Signed)
COVID test pending, monitor MyChart for results Chest xray from ED was normal, no pneumonia Use tessalon/benzonatate every 8 hours for cough Rest and drink plenty of fluids Follow up if not improving, worsening, developing fevers

## 2020-04-02 NOTE — ED Provider Notes (Signed)
[place]    CSN: [id number] Arrival date & time: 04/02/20  1003      History   Chief Complaint Chief Complaint  Patient presents with  . Shortness of Breath    HPI Juan Harrison is a 49 y.o. male history of HIV, tobacco use, CKD stage III presenting today for evaluation of a cough.  Patient notes over the past 3 days he has had a cough.  Symptoms worsened over the past 1 to 2 days.  He has not had associated shortness of breath which he describes as getting winded easily.  He denies difficulty taking a deep breath.  He denies fevers chills or body aches.  He denies nausea or vomiting.  Denies diarrhea.  Denies abdominal pain.  Has had some mild congestion, denies sore throat.  Denies close sick contacts.  Was seen in the emergency room which had negative troponins and CBC and CMP which is reassuring.  Chest x-ray without acute abnormality.  Recent HIV levels elevated.  HPI  Past Medical History:  Diagnosis Date  . AIDS [facility]) infectious disease--- dr hatcher   dx 2007,  usCD4,  count at 110  . Anal cancer [facility]) oncologist-  dr feng/  dr moody   intial dx 12-10-2016--  Invasive Squamous Cell carcinoma in situ, Stage IIIA [id number]) --- completed concurrent radiation and chemotherapy (rxt 01-27-2017 chemo 01-24-2017)    . Anal condyloma   . Hepatitis B surface antigen positive   . History of cancer chemotherapy 12-16-2016 to 01-24-2017  . History of radiation therapy 12-16-2016 to 01-27-2017   55Gy in 30 sessions  . Pulmonary nodule, right    per Chest CT 12-06-2008  . Rectal mass    anal    Patient Active Problem List   Diagnosis Date Noted  . CKD (chronic kidney disease) stage 2, GFR 60-89 ml/min 04/28/2018  . Herpes genitalis in men 02/12/2018  . Tobacco use 10/13/2017  . PICC (peripherally inserted central catheter) in place 01/07/2017  . Anal cancer ([place]) 12/10/2016  . Hepatitis B immune 10/29/2016  . AIDS ([place]) 12/02/2005    Past Surgical History:   Procedure Laterality Date  . IR GENERIC HISTORICAL  12/16/2016   IR US GUIDE VASC ACCESS RIGHT 12/16/2016 Arne Cleveland, MD [facility]-INTERV RAD  . IR GENERIC HISTORICAL  12/16/2016   IR FLUORO GUIDE CV LINE RIGHT 12/16/2016 Arne Cleveland, MD [facility]-INTERV RAD  . RECTAL BIOPSY N/A 11/29/2016   Procedure: BIOPSY ANAL CANAL MASS;  Surgeon: Leighton Ruff, MD;  Location: [place];  Service: General;  Laterality: N/A;  . RECTAL EXAM UNDER ANESTHESIA N/A 11/29/2016   Procedure: EXAM UNDER ANESTHESIA;  Surgeon: Leighton Ruff, MD;  Location: [place];  Service: General;  Laterality: N/A;  . RECTAL EXAM UNDER ANESTHESIA N/A 08/01/2017   Procedure: ANAL EXAM UNDER ANESTHESIA WITH BIOPSY;  Surgeon: Leighton Ruff, MD;  Location: [place];  Service: General;  Laterality: N/A;  . 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 Authorizing Provider  benzonatate (TESSALON) 200 MG capsule Take 1 capsule (200 mg total) by mouth 3 (three) times daily as needed for up to 7 days for cough. 04/02/20 04/09/20  Taysean Wager C, PA-C  elvitegravir-cobicistat-emtricitabine-tenofovir (GENVOYA) 150-150-200-10 MG TABS tablet Take 1 tablet by mouth daily with breakfast. 09/27/19   Bobby Rumpf  C, MD    Family History Family History  Problem Relation Age of Onset  . COPD Mother   . Asthma Brother     Social History Social History   Tobacco Use  . Smoking status: Current Every Day Smoker    Packs/day: 0.25    Years: 20.00    Pack years: 5.00    Types: Cigarettes    Last attempt to quit: 10/13/2016    Years since quitting: 3.4  . Smokeless tobacco: Never Used  . Tobacco comment: want the patch  Substance Use Topics  . Alcohol use: No    Alcohol/week: 1.0 standard drinks    Types: 1 Standard drinks or equivalent per week    Comment: previously drank alcohol on  weekends for 25 years, he quit on 10/14/2016   . Drug use: Yes    Types: Marijuana     Allergies   Patient has no known allergies.   Review of Systems Review of Systems  Constitutional: Positive for appetite change. Negative for activity change, chills, fatigue and fever.  HENT: Positive for congestion and rhinorrhea. Negative for ear pain, sinus pressure, sore throat and trouble swallowing.   Eyes: Negative for discharge and redness.  Respiratory: Positive for cough and shortness of breath. Negative for chest tightness.   Cardiovascular: Negative for chest pain.  Gastrointestinal: Negative for abdominal pain, diarrhea, nausea and vomiting.  Musculoskeletal: Negative for myalgias.  Skin: Negative for rash.  Neurological: Negative for dizziness, light-headedness and headaches.     Physical Exam Triage Vital Signs ED Triage Vitals  Enc Vitals Group     BP 04/02/20 1018 114/73     Pulse Rate 04/02/20 1018 87     Resp 04/02/20 1018 20     Temp 04/02/20 1018 98.4 F (36.9 C)     Temp Source 04/02/20 1018 Oral     SpO2 04/02/20 1018 100 %     Weight --      Height --      Head Circumference --      Peak Flow --      Pain Score 04/02/20 1015 1     Pain Loc --      Pain Edu? --      Excl. in [place]? --    No data found.  Updated Vital Signs BP 114/73 (BP Location: Right Arm)   Pulse 87   Temp 98.4 F (36.9 C) (Oral)   Resp 20   SpO2 100%   Visual Acuity Right Eye Distance:   Left Eye Distance:   Bilateral Distance:    Right Eye Near:   Left Eye Near:    Bilateral Near:     Physical Exam Vitals and nursing note reviewed.  Constitutional:      Appearance: He is well-developed.     Comments: No acute distress  HENT:     Head: Normocephalic and atraumatic.     Ears:     Comments: Bilateral ears without tenderness to palpation of external auricle, tragus and mastoid, EAC's without erythema or swelling, TM's with good bony landmarks and cone of light. Non  erythematous.     Nose: Nose normal.     Comments: Nasal mucosa erythematous    Mouth/Throat:     Comments: Oral mucosa pink and moist, no tonsillar enlargement or exudate. Posterior pharynx patent and nonerythematous, no uvula deviation or swelling. Normal phonation.  Eyes:     Conjunctiva/sclera: Conjunctivae normal.  Cardiovascular:     Rate and Rhythm:  Normal rate.  Pulmonary:     Effort: Pulmonary effort is normal. No respiratory distress.     Comments: Breathing comfortably at rest, CTABL, no wheezing, rales or other adventitious sounds auscultated Abdominal:     General: There is no distension.  Musculoskeletal:        General: Normal range of motion.     Cervical back: Neck supple.  Skin:    General: Skin is warm and dry.  Neurological:     Mental Status: He is alert and oriented to person, place, and time.      UC Treatments / Results  Labs (all labs ordered are listed, but only abnormal results are displayed) Labs Reviewed  SARS CORONAVIRUS 2 (TAT 6-24 HRS)    EKG   Radiology DG Chest 2 View  Result Date: 04/01/2020 CLINICAL DATA:  Shortness of breath.  Chest pain. EXAM: CHEST - 2 VIEW COMPARISON:  January 1st 2020 FINDINGS: The heart size and mediastinal contours are within normal limits. Both lungs are clear. The visualized skeletal structures are unremarkable. IMPRESSION: No active cardiopulmonary disease. Electronically Signed   By: Constance Holster M.D.   On: 04/01/2020 22:35    Procedures Procedures (including critical care time)  Medications Ordered in UC Medications - No data to display  Initial Impression / Assessment and Plan / UC Course  I have reviewed the triage vital signs and the nursing notes.  Pertinent labs & imaging results that were available during my care of the patient were reviewed by me and considered in my medical decision making (see chart for details).     Chest x-ray negative for pneumonia, Covid test pending.  Given  patient's HIV status likely candidate for infusion if Covid test positive.  Electrolytes within normal limits with reassuring CBC.  Likely viral you respiratory infection.  Recommending symptomatic and supportive care.  Rest and fluids.  Tessalon for cough.  Close monitoring, follow-up if not improving or worsening.  Discussed strict return precautions. Patient verbalized understanding and is agreeable with plan.  Final Clinical Impressions(s) / UC Diagnoses   Final diagnoses:  Viral URI with cough     Discharge Instructions     COVID test pending, monitor MyChart for results Chest xray from ED was normal, no pneumonia Use tessalon/benzonatate every 8 hours for cough Rest and drink plenty of fluids Follow up if not improving, worsening, developing fevers   ED Prescriptions    Medication Sig Dispense Auth. Provider   benzonatate (TESSALON) 200 MG capsule Take 1 capsule (200 mg total) by mouth 3 (three) times daily as needed for up to 7 days for cough. 28 capsule Isom Kochan, [place] C, PA-C     PDMP not reviewed this encounter.   Joneen Caraway [place] C, PA-C 04/02/20 1048

## 2020-04-02 NOTE — ED Triage Notes (Signed)
Pt presents with shortness of breath with a slight cough X 2 days.

## 2020-04-02 NOTE — ED Notes (Signed)
No answer x1 for vitals recheck 

## 2020-04-03 ENCOUNTER — Other Ambulatory Visit: Payer: Self-pay | Admitting: *Deleted

## 2020-04-03 MED FILL — BENZONATATE 200 MG CAP: 200 | 9 days supply | Qty: 28 | Fill #0

## 2020-04-03 NOTE — Patient Outreach (Signed)
[place] [facility]) Care Management  04/03/2020  Juan Harrison 06-25-1971 [id number]   Telephone Assessment-post ED visit   RN spoke with pt today and confirm he continue to recovery. Pt states he is feeling "much better". RN confirms pt is taking his prescribed medications for his cough and has all medications. No inquires or questions as pt states he will follow up with Dr. [name] (Primary) soon. Pt aware to call if he has any concerns and understanding his discharge paperwork.  RN will follow up in a few weeks for ongoing Management of care. Will update pt's provider at this time on pt's disposition with [facility] services.  Raina Mina, RN Care Management Coordinator [place] Office [phone]

## 2020-04-04 LAB — CBC
HCT: 39.8 % (ref 38.5–50.0)
Hemoglobin: 12.9 g/dL — ABNORMAL LOW (ref 13.2–17.1)
MCH: 29.1 pg (ref 27.0–33.0)
MCHC: 32.4 g/dL (ref 32.0–36.0)
MCV: 89.8 fL (ref 80.0–100.0)
MPV: 11.7 fL (ref 7.5–12.5)
Platelets: 185 10*3/uL (ref 140–400)
RBC: 4.43 10*6/uL (ref 4.20–5.80)
RDW: 12.7 % (ref 11.0–15.0)
WBC: 3.8 10*3/uL (ref 3.8–10.8)

## 2020-04-04 LAB — LIPID PANEL
Cholesterol: 119 mg/dL (ref <200)
HDL: 41 mg/dL (ref >=40)
LDL Cholesterol (Calc): 65 mg/dL (calc)
Non-HDL Cholesterol (Calc): 78 mg/dL (calc) (ref <130)
Total CHOL/HDL Ratio: 2.9 (calc) (ref <5.0)
Triglycerides: 52 mg/dL (ref <150)

## 2020-04-04 LAB — HIV RNA, RTPCR W/R GT (RTI, PI,INT)
HIV 1 RNA Quant: 12400 copies/mL — ABNORMAL HIGH
HIV-1 RNA Quant, Log: 4.09 Log copies/mL — ABNORMAL HIGH

## 2020-04-04 LAB — COMPREHENSIVE METABOLIC PANEL
AG Ratio: 1.1 (calc) (ref 1.0–2.5)
ALT: 33 U/L (ref 9–46)
AST: 28 U/L (ref 10–40)
Albumin: 3.6 g/dL (ref 3.6–5.1)
Alkaline phosphatase (APISO): 75 U/L (ref 36–130)
BUN: 14 mg/dL (ref 7–25)
CO2: 28 mmol/L (ref 20–32)
Calcium: 8.7 mg/dL (ref 8.6–10.3)
Chloride: 109 mmol/L (ref 98–110)
Creat: 1.01 mg/dL (ref 0.60–1.35)
Globulin: 3.3 g/dL (calc) (ref 1.9–3.7)
Glucose, Bld: 111 mg/dL — ABNORMAL HIGH (ref 65–99)
Potassium: 3.9 mmol/L (ref 3.5–5.3)
Sodium: 139 mmol/L (ref 135–146)
Total Bilirubin: 0.3 mg/dL (ref 0.2–1.2)
Total Protein: 6.9 g/dL (ref 6.1–8.1)

## 2020-04-04 LAB — HIV-1 INTEGRASE GENOTYPE

## 2020-04-04 LAB — HIV-1 GENOTYPE: HIV-1 Genotype: DETECTED — AB

## 2020-04-04 LAB — RPR: RPR Ser Ql: NONREACTIVE

## 2020-04-11 ENCOUNTER — Ambulatory Visit (INDEPENDENT_AMBULATORY_CARE_PROVIDER_SITE_OTHER): Payer: Medicare Other | Admitting: Infectious Diseases

## 2020-04-11 ENCOUNTER — Other Ambulatory Visit: Payer: Self-pay | Admitting: *Deleted

## 2020-04-11 ENCOUNTER — Encounter: Payer: Self-pay | Admitting: Infectious Diseases

## 2020-04-11 ENCOUNTER — Other Ambulatory Visit: Payer: Self-pay

## 2020-04-11 VITALS — BP 107/69 | HR 73 | Temp 98.1°F | Ht 75.0 in | Wt 132.0 lb

## 2020-04-11 DIAGNOSIS — B20 Human immunodeficiency virus [HIV] disease: Secondary | ICD-10-CM

## 2020-04-11 DIAGNOSIS — C210 Malignant neoplasm of anus, unspecified: Secondary | ICD-10-CM

## 2020-04-11 DIAGNOSIS — Z79899 Other long term (current) drug therapy: Secondary | ICD-10-CM | POA: Diagnosis not present

## 2020-04-11 DIAGNOSIS — Z113 Encounter for screening for infections with a predominantly sexual mode of transmission: Secondary | ICD-10-CM

## 2020-04-11 DIAGNOSIS — N182 Chronic kidney disease, stage 2 (mild): Secondary | ICD-10-CM

## 2020-04-11 MED ORDER — MEGESTROL ACETATE 400 MG/10ML PO SUSP: 400.0000 mg | Freq: Every day | ORAL | 3 refills | Status: DC

## 2020-04-11 MED ORDER — DARUN-COBIC-EMTRICIT-TENOFAF 800-150-200-10 MG PO TABS: 1.0000 | ORAL_TABLET | Freq: Every day | ORAL | 3 refills | Status: DC

## 2020-04-11 MED ORDER — TIVICAY 50 MG PO TABS: 50.0000 mg | ORAL_TABLET | Freq: Every day | ORAL | 3 refills | Status: DC

## 2020-04-11 NOTE — Assessment & Plan Note (Signed)
Will re-refer him to surgery

## 2020-04-11 NOTE — Assessment & Plan Note (Addendum)
Cr slight increase on current labs (stil wnl), will continue to monitor.

## 2020-04-11 NOTE — Progress Notes (Signed)
   Subjective:    Patient ID: Juan Harrison, male    DOB: Jun 27, 1971, 49 y.o.   MRN: [id number]  HPI 49yo M with hx of HIV/AIDS. Dx ~2007.ART hx- Darunavir-norvir/truvada/Epzicombactrim(Hx of non-compliance).  He was changed to genvoya 05-2016. He has been seen by [facility]/Onc/Rad Onc for anal cancer. Finished CTX and XRT 01-2017. DId not see surgery after his last visit , "I didn't have an appt".  Has been doing "ok" with his medicine. Has not yet taken this AM- usually takes with food.   HIV 1 RNA Quant (copies/mL)  Date Value  03/27/2020 12,400 (H)  09/06/2019 18,400 (H)  01/18/2019 <20 DETECTED (A)   CD4 (no units)  Date Value  12/11/2016 See Separate Report   CD4 T Cell Abs (/uL)  Date Value  03/27/2020 114 (L)  09/06/2019 180 (L)  01/18/2019 310 (L)     Review of Systems  Constitutional: Positive for appetite change and unexpected weight change.  Gastrointestinal: Negative for constipation and diarrhea.       Lesions on anus  Genitourinary: Negative for difficulty urinating.  Psychiatric/Behavioral: Negative for sleep disturbance.       Objective:   Physical Exam Vitals reviewed.  Constitutional:      General: He is not in acute distress.    Appearance: He is not ill-appearing, toxic-appearing or diaphoretic.  HENT:     Mouth/Throat:     Mouth: Mucous membranes are moist.     Pharynx: No oropharyngeal exudate.  Eyes:     Extraocular Movements: Extraocular movements intact.     Pupils: Pupils are equal, round, and reactive to light.  Cardiovascular:     Rate and Rhythm: Normal rate and regular rhythm.  Pulmonary:     Effort: Pulmonary effort is normal.     Breath sounds: Normal breath sounds.  Abdominal:     General: Bowel sounds are normal. There is no distension.     Palpations: Abdomen is soft.     Tenderness: There is no abdominal tenderness.    Musculoskeletal:     Cervical back: Normal range of motion and neck supple.     Right lower leg:  No edema.     Left lower leg: No edema.  Neurological:     General: No focal deficit present.  Psychiatric:        Mood and Affect: Mood normal.           Assessment & Plan:

## 2020-04-11 NOTE — Assessment & Plan Note (Signed)
Encouraged to take covid vaccine- given info Will send to dental Will start megace Will change art to prezcobix, tivicay Will see him back in 4-6 weeks.

## 2020-05-02 DIAGNOSIS — Z85048 Personal history of other malignant neoplasm of rectum, rectosigmoid junction, and anus: Secondary | ICD-10-CM | POA: Diagnosis not present

## 2020-05-11 ENCOUNTER — Other Ambulatory Visit: Payer: Self-pay | Admitting: *Deleted

## 2020-05-11 NOTE — Patient Outreach (Signed)
[place] [facility]) Care Management  05/11/2020  Juan Harrison 06-Jun-1971 [id number]   Telephone Assessment  RN spoke with pt today and received an update that he is doing well with no related issues. Verified pt has all his medications and taking accordingly along with attendance to all his medical appointments. Review pt's current plan of care with all goals met. NO other needs to address at this time.  Plan: Based upon pt's self management of care with no related issues at this time will close this case and notify the provider of pt's pt's disposition with [facility].  Raina Mina, RN Care Management Coordinator [place] Office [phone]

## 2020-05-15 ENCOUNTER — Other Ambulatory Visit: Payer: Self-pay | Admitting: General Surgery

## 2020-05-15 DIAGNOSIS — Q828 Other specified congenital malformations of skin: Secondary | ICD-10-CM | POA: Diagnosis not present

## 2020-05-17 MED FILL — SYMTUZA 800-150-200-10 MG T: 800-150-200 | 30 days supply | Qty: 30 | Fill #1

## 2020-05-17 MED FILL — TIVICAY 50 MG TABLET: 50 | 30 days supply | Qty: 30 | Fill #1

## 2020-05-23 ENCOUNTER — Encounter: Payer: Self-pay | Admitting: Infectious Diseases

## 2020-05-23 DIAGNOSIS — Q828 Other specified congenital malformations of skin: Secondary | ICD-10-CM | POA: Insufficient documentation

## 2020-06-13 MED FILL — TIVICAY 50 MG TABLET: 50 | 30 days supply | Qty: 30 | Fill #1

## 2020-06-13 MED FILL — MEGESTROL ACET 40 MG/ML SUS: 40 | 24 days supply | Qty: 240 | Fill #1

## 2020-06-13 MED FILL — SYMTUZA 800-150-200-10 MG T: 800-150-200 | 30 days supply | Qty: 30 | Fill #1

## 2020-07-06 ENCOUNTER — Ambulatory Visit (INDEPENDENT_AMBULATORY_CARE_PROVIDER_SITE_OTHER): Payer: Medicare Other | Admitting: Infectious Diseases

## 2020-07-06 ENCOUNTER — Other Ambulatory Visit: Payer: Self-pay

## 2020-07-06 ENCOUNTER — Encounter: Payer: Self-pay | Admitting: Infectious Diseases

## 2020-07-06 VITALS — BP 106/64 | HR 69 | Temp 98.4°F | Wt 141.0 lb

## 2020-07-06 DIAGNOSIS — Z113 Encounter for screening for infections with a predominantly sexual mode of transmission: Secondary | ICD-10-CM | POA: Diagnosis not present

## 2020-07-06 DIAGNOSIS — K089 Disorder of teeth and supporting structures, unspecified: Secondary | ICD-10-CM | POA: Diagnosis not present

## 2020-07-06 DIAGNOSIS — C210 Malignant neoplasm of anus, unspecified: Secondary | ICD-10-CM | POA: Diagnosis not present

## 2020-07-06 DIAGNOSIS — B20 Human immunodeficiency virus [HIV] disease: Secondary | ICD-10-CM

## 2020-07-06 DIAGNOSIS — Z79899 Other long term (current) drug therapy: Secondary | ICD-10-CM

## 2020-07-06 NOTE — Assessment & Plan Note (Signed)
Appreciate [facility] f/u.  He has further f/u pending per pt.

## 2020-07-06 NOTE — Progress Notes (Signed)
   Subjective:    Patient ID: Juan Harrison, male    DOB: Dec 09, 1970, 49 y.o.   MRN: [id number]  HPI 49yo M with hx of HIV/AIDS. Dx ~2007.ART hx-Darunavir-norvir/truvada/Epzicombactrim(Hx of non-compliance).  He was changed to genvoya 05-2016.Switched to Darunavir-r/descovey 04-2020.  He has been seen by [facility]c/Rad Onc for anal cancer. Finished CTX and XRT 01-2017.  "tired- just got off one job and I have to go to another".  No problems with new ART.  Had f/u with [facility] re: prev anal Ca. He is being sent to have laser surgery by his recollection.   Still has not gotten COVID vax. Afraid he will get sick from this.   HIV 1 RNA Quant (copies/mL)  Date Value  03/27/2020 12,400 (H)  09/06/2019 18,400 (H)  01/18/2019 <20 DETECTED (A)   CD4 (no units)  Date Value  12/11/2016 See Separate Report   CD4 T Cell Abs (/uL)  Date Value  03/27/2020 114 (L)  09/06/2019 180 (L)  01/18/2019 310 (L)    Review of Systems  Constitutional: Negative for appetite change, chills, fever and unexpected weight change.  Respiratory: Negative for cough and shortness of breath.   Gastrointestinal: Negative for constipation and diarrhea.  Genitourinary: Negative for difficulty urinating and dysuria.  Musculoskeletal: Negative for arthralgias.  Please see HPI. All other systems reviewed and negative.      Objective:   Physical Exam Vitals reviewed.  Constitutional:      General: He is not in acute distress.    Appearance: Normal appearance. He is not toxic-appearing.  HENT:     Mouth/Throat:     Mouth: Mucous membranes are moist.     Pharynx: No oropharyngeal exudate.  Eyes:     Extraocular Movements: Extraocular movements intact.     Pupils: Pupils are equal, round, and reactive to light.  Cardiovascular:     Rate and Rhythm: Normal rate and regular rhythm.  Pulmonary:     Effort: Pulmonary effort is normal.     Breath sounds: Normal breath sounds.  Abdominal:     General: Bowel  sounds are normal. There is no distension.     Palpations: Abdomen is soft.     Tenderness: There is no abdominal tenderness.  Musculoskeletal:     Cervical back: Normal range of motion and neck supple.  Neurological:     General: No focal deficit present.     Mental Status: He is alert.  Psychiatric:        Mood and Affect: Mood normal.           Assessment & Plan:

## 2020-07-06 NOTE — Assessment & Plan Note (Signed)
Dental referral placed today for [facility]. Information to schedule appointment completed today.   

## 2020-07-06 NOTE — Assessment & Plan Note (Signed)
States he is taking his meds Will send him for HIV RNA/CD4 today Wt up 9# Offered/refused condoms.  Encouraged him to get COVID vax.  rtc in 6 months.

## 2020-07-14 ENCOUNTER — Telehealth: Payer: Self-pay

## 2020-07-14 LAB — HIV-1 RNA ULTRAQUANT REFLEX TO GENTYP+
HIV 1 RNA Quant: 8840 copies/mL — ABNORMAL HIGH
HIV-1 RNA Quant, Log: 3.95 Log copies/mL — ABNORMAL HIGH

## 2020-07-14 LAB — HIV-1 GENOTYPE: HIV-1 Genotype: DETECTED — AB

## 2020-07-14 NOTE — Telephone Encounter (Signed)
-----   Message from Thayer Headings, MD sent at 07/14/2020 10:33 AM EDT ----- Regarding: poor compliance His labs confirm poor compliance and needs to be reassessed sooner than 6 months if you could get him in with [facility] or [facility].   thanks

## 2020-07-14 NOTE — Telephone Encounter (Signed)
Patient scheduled with [facility] on 8/24 to review regimen and adherence concerns.   Rohin Krejci Lorita Officer, RN

## 2020-07-20 ENCOUNTER — Ambulatory Visit: Payer: Medicare Other | Admitting: Infectious Diseases

## 2020-07-25 ENCOUNTER — Other Ambulatory Visit: Payer: Self-pay

## 2020-07-25 ENCOUNTER — Other Ambulatory Visit: Payer: Self-pay | Admitting: Pharmacist

## 2020-07-25 ENCOUNTER — Ambulatory Visit (INDEPENDENT_AMBULATORY_CARE_PROVIDER_SITE_OTHER): Payer: Medicare Other | Admitting: Pharmacist

## 2020-07-25 DIAGNOSIS — B20 Human immunodeficiency virus [HIV] disease: Secondary | ICD-10-CM | POA: Diagnosis not present

## 2020-07-25 MED ORDER — SULFAMETHOXAZOLE-TRIMETHOPRIM 400-80 MG PO TABS: 1.0000 | ORAL_TABLET | Freq: Every day | ORAL | 5 refills | Status: DC

## 2020-07-25 MED FILL — SULFAMETHOXAZOLE-TMP SS TAB: 400-80 | 30 days supply | Qty: 30 | Fill #0

## 2020-07-25 NOTE — Progress Notes (Signed)
HPI: Juan Harrison is a 49 y.o. male who presents to the [facility] clinic for HIV follow-up.  Patient Active Problem List   Diagnosis Date Noted  . Poor dentition 07/06/2020  . Porokeratosis 05/23/2020  . CKD (chronic kidney disease) stage 2, GFR 60-89 ml/min 04/28/2018  . Herpes genitalis in men 02/12/2018  . Tobacco use 10/13/2017  . 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 Hepatitis B immune 10/29/2016  . AIDS ([place]) 12/02/2005    Patient's Medications  New Prescriptions   SULFAMETHOXAZOLE-TRIMETHOPRIM (BACTRIM) 400-80 MG TABLET    Take 1 tablet by mouth daily.  Previous Medications   DARUNAVIR-COBICISCTAT-EMTRICITABINE-TENOFOVIR ALAFENAMIDE (SYMTUZA) 800-150-200-10 MG TABS    Take 1 tablet by mouth daily with breakfast.   DOLUTEGRAVIR (TIVICAY) 50 MG TABLET    Take 1 tablet (50 mg total) by mouth daily.   MEGESTROL (MEGACE) 400 MG/10ML SUSPENSION    Take 10 mLs (400 mg total) by mouth daily.  Modified Medications   No medications on file  Discontinued Medications   No medications on file    Allergies: No Known Allergies  Past Medical History: Past Medical History:  Diagnosis Date  . AIDS [facility]) infectious disease--- dr hatcher   dx 2007,  usCD4,  count at 110  . Anal cancer [facility]) oncologist-  dr feng/  dr moody   intial dx 12-10-2016--  Invasive Squamous Cell carcinoma in situ, Stage IIIA ([id number]) --- completed concurrent radiation and chemotherapy (rxt 01-27-2017 chemo 01-24-2017)    . Anal condyloma   . Hepatitis B surface antigen positive   . History of cancer chemotherapy 12-16-2016 to 01-24-2017  . History of radiation therapy 12-16-2016 to 01-27-2017   55Gy in 30 sessions  . Pulmonary nodule, right    per Chest CT 12-06-2008  . Rectal mass    anal    Social History: Social History   Socioeconomic History  . Marital status: Single    Spouse name: Not on file  . Number of children: Not on file  . Years of education: Not on file  . Highest education  level: Not on file  Occupational History  . Not on file  Tobacco Use  . Smoking status: Current Every Day Smoker    Packs/day: 0.25    Years: 20.00    Pack years: 5.00    Types: Cigarettes, Cigars    Last attempt to quit: 10/13/2016    Years since quitting: 3.7  . Smokeless tobacco: Never Used  . Tobacco comment: 3 black and milds a day  Substance and Sexual Activity  . Alcohol use: No    Alcohol/week: 1.0 standard drink    Types: 1 Standard drinks or equivalent per week    Comment: previously drank alcohol on weekends for 25 years, he quit on 10/14/2016   . Drug use: Not Currently    Types: Marijuana  . Sexual activity: Not Currently    Partners: Male    Birth control/protection: Condom    Comment: given condoms  Other Topics Concern  . Not on file  Social History Narrative   Single, lives alone about 10 minutes from [place] in 2 story townhouse   Has one sister, [name] who lives in [place] in past via friends   On disability   Social Determinants of Health   Financial Resource Strain:   . Difficulty of Paying Living Expenses: Not on file  Food Insecurity:   . Worried About Charity fundraiser in the Last Year:  Not on file  . Ran Out of Food in the Last Year: Not on file  Transportation Needs:   . Lack of Transportation (Medical): Not on file  . Lack of Transportation (Non-Medical): Not on file  Physical Activity:   . Days of Exercise per Week: Not on file  . Minutes of Exercise per Session: Not on file  Stress:   . Feeling of Stress : Not on file  Social Connections:   . Frequency of Communication with Friends and Family: Not on file  . Frequency of Social Gatherings with Friends and Family: Not on file  . Attends Religious Services: Not on file  . Active Member of Clubs or Organizations: Not on file  . Attends Archivist Meetings: Not on file  . Marital Status: Not on file    Labs: Lab Results  Component Value Date    HIV1RNAQUANT 8,840 (H) 07/06/2020   HIV1RNAQUANT 12,400 (H) 03/27/2020   HIV1RNAQUANT 18,400 (H) 09/06/2019   CD4TABS 114 (L) 03/27/2020   CD4TABS 180 (L) 09/06/2019   CD4TABS 310 (L) 01/18/2019    RPR and STI Lab Results  Component Value Date   LABRPR NON-REACTIVE 03/27/2020   LABRPR NON-REACTIVE 01/18/2019   LABRPR NON-REACTIVE 04/28/2018   LABRPR NON-REACTIVE 10/06/2017   LABRPR NON REAC 10/29/2016   RPRTITER 1:1 02/29/2016    STI Results GC CT  02/29/2016 Negative Negative    Hepatitis B Lab Results  Component Value Date   HEPBSAB NEG 02/29/2016   HEPBSAG POSITIVE (A) 02/29/2016   HEPBCAB NON REACTIVE 02/29/2016   Hepatitis C No results found for: HEPCAB, HCVRNAPCRQN Hepatitis A Lab Results  Component Value Date   HAV NON REACTIVE 02/29/2016   Lipids: Lab Results  Component Value Date   CHOL 119 03/27/2020   TRIG 52 03/27/2020   HDL 41 03/27/2020   CHOLHDL 2.9 03/27/2020   VLDL 14 02/29/2016   LDLCALC 65 03/27/2020    Current HIV Regimen: Symtuza one tablet once daily Tivicay one tablet once daily   Assessment: Juan Harrison was seen today for HIV follow-up with pharmacy due to concerns for ART noncompliance. He was seen by Dr. [name] earlier this month with a viral load of 8840. His CD4 in April was 114. He has been prescribed Symtuza and Tivicay and states he always takes both of these medications together. He states he takes his medicine ~4 times per week. He denies any side effects from either medication. He mentioned being stressed as he works "all the time". He states he forgets to take his medicine in the morning when he does not eat breakfast. He does not eat breakfast everyday and generally has a poor appetite. He states he will occasionally take Megace even though it tastes bad. He works in the evening and states he usually eats dinner during his shift. After discussion, he plans to set a daily alarm to take his medicine with dinner every day at work  around 5:30PM. Provided patient with a keychain bottle to hold his medicines in when he goes to work.   Reviewed with patient that lack of appetite may be an overall symptom of uncontrolled HIV. Counseled the patient on the importance of taking his medicine everyday in order to improve his health and decrease his viral load. Due to his CD4 count less than 200, will initiate patient on PCP prophylaxis with SMX/TMP today. He will pick up the prescription at [facility]. Further counseled the patient on the importance of taking  this new antibiotic in order to prevent infections since he is more susceptible to them given his lower CD4 count.   Will follow up with patient in two weeks to assess his adherence after utilizing his alarm at work. Will not collect labs as recently collected earlier this month. Will likely assess viral load and CD4 count at next follow-up visit.   Plan: Follow up in 2 weeks  Alfonse Spruce, PharmD PGY2 ID Pharmacy Resident [phone]  07/25/2020, 3:15 PM

## 2020-07-27 MED FILL — MEGESTROL ACET 40 MG/ML SUS: 40 | 24 days supply | Qty: 240 | Fill #2

## 2020-07-27 MED FILL — SYMTUZA 800-150-200-10 MG T: 800-150-200 | 30 days supply | Qty: 30 | Fill #2

## 2020-07-27 MED FILL — TIVICAY 50 MG TABLET: 50 | 30 days supply | Qty: 30 | Fill #2

## 2020-08-08 ENCOUNTER — Other Ambulatory Visit: Payer: Self-pay

## 2020-08-08 ENCOUNTER — Ambulatory Visit (INDEPENDENT_AMBULATORY_CARE_PROVIDER_SITE_OTHER): Payer: Medicare Other | Admitting: Pharmacist

## 2020-08-08 DIAGNOSIS — B20 Human immunodeficiency virus [HIV] disease: Secondary | ICD-10-CM | POA: Diagnosis not present

## 2020-08-08 NOTE — Progress Notes (Signed)
HPI: Juan Harrison is a 49 y.o. male who presents to the [place] clinic for HIV follow-up.  Patient Active Problem List   Diagnosis Date Noted  . Poor dentition 07/06/2020  . Porokeratosis 05/23/2020  . CKD (chronic kidney disease) stage 2, GFR 60-89 ml/min 04/28/2018  . Herpes genitalis in men 02/12/2018  . Tobacco use 10/13/2017  . 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 Hepatitis B immune 10/29/2016  . AIDS ([place]) 12/02/2005    Patient's Medications  New Prescriptions   No medications on file  Previous Medications   DARUNAVIR-COBICISCTAT-EMTRICITABINE-TENOFOVIR ALAFENAMIDE (SYMTUZA) 800-150-200-10 MG TABS    Take 1 tablet by mouth daily with breakfast.   DOLUTEGRAVIR (TIVICAY) 50 MG TABLET    Take 1 tablet (50 mg total) by mouth daily.   MEGESTROL (MEGACE) 400 MG/10ML SUSPENSION    Take 10 mLs (400 mg total) by mouth daily.   SULFAMETHOXAZOLE-TRIMETHOPRIM (BACTRIM) 400-80 MG TABLET    Take 1 tablet by mouth daily.  Modified Medications   No medications on file  Discontinued Medications   No medications on file    Allergies: No Known Allergies  Past Medical History: Past Medical History:  Diagnosis Date  . AIDS [facility]) infectious disease--- dr hatcher   dx 2007,  usCD4,  count at 110  . Anal cancer [facility]) oncologist-  dr feng/  dr moody   intial dx 12-10-2016--  Invasive Squamous Cell carcinoma in situ, Stage IIIA ([id number]) --- completed concurrent radiation and chemotherapy (rxt 01-27-2017 chemo 01-24-2017)    . Anal condyloma   . Hepatitis B surface antigen positive   . History of cancer chemotherapy 12-16-2016 to 01-24-2017  . History of radiation therapy 12-16-2016 to 01-27-2017   55Gy in 30 sessions  . Pulmonary nodule, right    per Chest CT 12-06-2008  . Rectal mass    anal    Social History: Social History   Socioeconomic History  . Marital status: Single    Spouse name: Not on file  . Number of children: Not on file  . Years of education: Not on  file  . Highest education level: Not on file  Occupational History  . Not on file  Tobacco Use  . Smoking status: Current Every Day Smoker    Packs/day: 0.25    Years: 20.00    Pack years: 5.00    Types: Cigarettes, Cigars    Last attempt to quit: 10/13/2016    Years since quitting: 3.8  . Smokeless tobacco: Never Used  . Tobacco comment: 3 black and milds a day  Substance and Sexual Activity  . Alcohol use: No    Alcohol/week: 1.0 standard drink    Types: 1 Standard drinks or equivalent per week    Comment: previously drank alcohol on weekends for 25 years, he quit on 10/14/2016   . Drug use: Not Currently    Types: Marijuana  . Sexual activity: Not Currently    Partners: Male    Birth control/protection: Condom    Comment: given condoms  Other Topics Concern  . Not on file  Social History Narrative   Single, lives alone about 10 minutes from [place] in 2 story townhouse   Has one sister, [name] who lives in [place] in past via friends   On disability   Social Determinants of Health   Financial Resource Strain:   . Difficulty of Paying Living Expenses: Not on file  Food Insecurity:   . Worried About Estate manager/land agent  of Food in the Last Year: Not on file  . Ran Out of Food in the Last Year: Not on file  Transportation Needs:   . Lack of Transportation (Medical): Not on file  . Lack of Transportation (Non-Medical): Not on file  Physical Activity:   . Days of Exercise per Week: Not on file  . Minutes of Exercise per Session: Not on file  Stress:   . Feeling of Stress : Not on file  Social Connections:   . Frequency of Communication with Friends and Family: Not on file  . Frequency of Social Gatherings with Friends and Family: Not on file  . Attends Religious Services: Not on file  . Active Member of Clubs or Organizations: Not on file  . Attends Archivist Meetings: Not on file  . Marital Status: Not on file    Labs: Lab Results    Component Value Date   HIV1RNAQUANT 8,840 (H) 07/06/2020   HIV1RNAQUANT 12,400 (H) 03/27/2020   HIV1RNAQUANT 18,400 (H) 09/06/2019   CD4TABS 114 (L) 03/27/2020   CD4TABS 180 (L) 09/06/2019   CD4TABS 310 (L) 01/18/2019    RPR and STI Lab Results  Component Value Date   LABRPR NON-REACTIVE 03/27/2020   LABRPR NON-REACTIVE 01/18/2019   LABRPR NON-REACTIVE 04/28/2018   LABRPR NON-REACTIVE 10/06/2017   LABRPR NON REAC 10/29/2016   RPRTITER 1:1 02/29/2016    STI Results GC CT  02/29/2016 Negative Negative    Hepatitis B Lab Results  Component Value Date   HEPBSAB NEG 02/29/2016   HEPBSAG POSITIVE (A) 02/29/2016   HEPBCAB NON REACTIVE 02/29/2016   Hepatitis C No results found for: HEPCAB, HCVRNAPCRQN Hepatitis A Lab Results  Component Value Date   HAV NON REACTIVE 02/29/2016   Lipids: Lab Results  Component Value Date   CHOL 119 03/27/2020   TRIG 52 03/27/2020   HDL 41 03/27/2020   CHOLHDL 2.9 03/27/2020   VLDL 14 02/29/2016   LDLCALC 65 03/27/2020    Current HIV Regimen: Symtuza one tablet once daily Tivicay one tablet once daily   Assessment: Juan Harrison presents to clinic today for two-week follow-up for HIV regimen adherence. Patient had planned to take medicine with alarm during dinner break at work at last visit, and today he states he has taken his medications ~5 days/week. He states he takes both of his pills daily in the evening on the days he works, but he takes the medicine in the morning on the days he does not work. 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 He states he takes medicine after he eats breakfast or dinner and seems to have improved appetite without Megace. Additionally, patient has not started taking Bactrim due to cost, but encouraged him to pick up. Will check HIV viral load and CD4 count today. Plan to follow up with labs in 4 weeks.    Plan: Check CD4 count and HIV  viral load today Follow-up for adherence and labs in 4 weeks   Alfonse Spruce, PharmD PGY2 ID Pharmacy Resident [phone]  08/08/2020, 9:53 AM

## 2020-08-09 LAB — T-HELPER CELL (CD4) - (RCID CLINIC ONLY)
CD4 % Helper T Cell: 14 % — ABNORMAL LOW (ref 33–65)
CD4 T Cell Abs: 125 /uL — ABNORMAL LOW (ref 400–1790)

## 2020-08-11 LAB — HIV-1 RNA QUANT-NO REFLEX-BLD
HIV 1 RNA Quant: <20 Copies/mL — ABNORMAL HIGH
HIV-1 RNA Quant, Log: <1.3 Log cps/mL — ABNORMAL HIGH

## 2020-08-18 MED FILL — TIVICAY 50 MG TABLET: 50 | 30 days supply | Qty: 30 | Fill #2

## 2020-08-18 MED FILL — SYMTUZA 800-150-200-10 MG T: 800-150-200 | 30 days supply | Qty: 30 | Fill #2

## 2020-08-18 MED FILL — SULFAMETHOXAZOLE-TMP SS TAB: 400-80 | 30 days supply | Qty: 30 | Fill #0

## 2020-08-18 MED FILL — MEGESTROL ACET 40 MG/ML SUS: 40 | 24 days supply | Qty: 240 | Fill #2

## 2020-09-06 ENCOUNTER — Ambulatory Visit: Payer: Medicare Other | Admitting: Pharmacist

## 2020-09-07 MED FILL — SULFAMETHOXAZOLE-TMP SS TAB: 400-80 | 30 days supply | Qty: 30 | Fill #0

## 2020-09-07 MED FILL — SYMTUZA 800-150-200-10 MG T: 800-150-200 | 30 days supply | Qty: 30 | Fill #2

## 2020-09-07 MED FILL — MEGESTROL ACET 40 MG/ML SUS: 40 | 24 days supply | Qty: 240 | Fill #2

## 2020-09-07 MED FILL — TIVICAY 50 MG TABLET: 50 | 30 days supply | Qty: 30 | Fill #2

## 2020-09-27 DIAGNOSIS — Z85048 Personal history of other malignant neoplasm of rectum, rectosigmoid junction, and anus: Secondary | ICD-10-CM | POA: Diagnosis not present

## 2020-10-03 MED FILL — SULFAMETHOXAZOLE-TMP SS TAB: 400-80 | 30 days supply | Qty: 30 | Fill #1

## 2020-10-03 MED FILL — SYMTUZA 800-150-200-10 MG T: 800-150-200 | 30 days supply | Qty: 30 | Fill #3

## 2020-10-03 MED FILL — MEGESTROL ACET 40 MG/ML SUS: 40 | 24 days supply | Qty: 240 | Fill #3

## 2020-10-03 MED FILL — TIVICAY 50 MG TABLET: 50 | 30 days supply | Qty: 30 | Fill #3

## 2020-10-11 MED FILL — TIVICAY 50 MG TABLET: 50 | 30 days supply | Qty: 30 | Fill #3

## 2020-11-08 MED FILL — SYMTUZA 800-150-200-10 MG T: 800-150-200 | 30 days supply | Qty: 30 | Fill #4

## 2020-11-08 MED FILL — TIVICAY 50 MG TABLET: 50 | 30 days supply | Qty: 30 | Fill #4

## 2020-11-29 MED FILL — TIVICAY 50 MG TABLET: 50 | 30 days supply | Qty: 30 | Fill #4

## 2020-11-29 MED FILL — SYMTUZA 800-150-200-10 MG T: 800-150-200 | 30 days supply | Qty: 30 | Fill #4

## 2021-01-01 MED FILL — SYMTUZA 800-150-200-10 MG T: 800-150-200 | 30 days supply | Qty: 30 | Fill #5

## 2021-01-01 MED FILL — TIVICAY 50 MG TABLET: 50 | 30 days supply | Qty: 30 | Fill #5

## 2021-01-09 ENCOUNTER — Ambulatory Visit: Payer: Medicare Other | Admitting: Infectious Diseases

## 2021-01-12 ENCOUNTER — Encounter: Payer: Self-pay | Admitting: Infectious Diseases

## 2021-01-12 ENCOUNTER — Ambulatory Visit (INDEPENDENT_AMBULATORY_CARE_PROVIDER_SITE_OTHER): Payer: Medicare Other | Admitting: Infectious Diseases

## 2021-01-12 ENCOUNTER — Other Ambulatory Visit: Payer: Self-pay

## 2021-01-12 VITALS — BP 98/63 | HR 84 | Temp 97.5°F | Resp 16 | Ht 75.0 in | Wt 140.8 lb

## 2021-01-12 DIAGNOSIS — B20 Human immunodeficiency virus [HIV] disease: Secondary | ICD-10-CM

## 2021-01-12 DIAGNOSIS — K089 Disorder of teeth and supporting structures, unspecified: Secondary | ICD-10-CM | POA: Diagnosis not present

## 2021-01-12 DIAGNOSIS — Z113 Encounter for screening for infections with a predominantly sexual mode of transmission: Secondary | ICD-10-CM

## 2021-01-12 DIAGNOSIS — C210 Malignant neoplasm of anus, unspecified: Secondary | ICD-10-CM | POA: Diagnosis not present

## 2021-01-12 DIAGNOSIS — Z79899 Other long term (current) drug therapy: Secondary | ICD-10-CM | POA: Diagnosis not present

## 2021-01-12 DIAGNOSIS — Z23 Encounter for immunization: Secondary | ICD-10-CM

## 2021-01-12 DIAGNOSIS — N182 Chronic kidney disease, stage 2 (mild): Secondary | ICD-10-CM | POA: Diagnosis not present

## 2021-01-12 NOTE — Addendum Note (Signed)
Addended by: Caffie Pinto on: 01/12/2021 11:24 AM   Modules accepted: Orders

## 2021-01-12 NOTE — Assessment & Plan Note (Signed)
He has surgical f/u pending.  Appreciate their assistance in mgmt.

## 2021-01-12 NOTE — Assessment & Plan Note (Signed)
Will ask [facility] if they can see him again.

## 2021-01-12 NOTE — Assessment & Plan Note (Signed)
Cr is normal today ? If elevated from integrase inhibitor? Cobicistat?

## 2021-01-12 NOTE — Assessment & Plan Note (Signed)
He is doing well Will check his labs to see if taking his meds Offered/refused condoms.  He consents to COVID vaccine study (unvax) He agrees to [employer] today as well as flu vax Will speak with him about colon as well as Tdap at f/u visit.  rtc in 6 months.

## 2021-01-12 NOTE — Addendum Note (Signed)
Addended by: Caffie Pinto on: 01/12/2021 11:58 AM   Modules accepted: Orders

## 2021-01-12 NOTE — Progress Notes (Signed)
   Subjective:    Patient ID: LAVONNE CASS, male  DOB: [birth year], 50 y.o.        MRN: [id number]   HPI 50yo M with hx of HIV/AIDS. Dx ~2007.ART hx-Darunavir-norvir/truvada/Epzicombactrim(Hx of non-compliance).  He was changed to genvoya 05-2016.Switched to Symtuza/descovey 04-2020.  He has been seen by [facility]/Rad Onc for anal cancer. Finished CTX and XRT 01-2017.  He continues to have fatigue from work Seeing his counselor today.  Had single episode of n/v with taking art since his visit with pharm 08-2020.  Had f/u with [facility] re: prev anal Ca. He is being sent to have laser surgery by his recollection. Has  F/u 03-2021.  He has not gotten dental appt yet.   HIV 1 RNA Quant  Date Value  08/08/2020 <20 Copies/mL (H)  07/06/2020 8,840 copies/mL (H)  03/27/2020 12,400 copies/mL (H)   CD4 (no units)  Date Value  12/11/2016 See Separate Report   CD4 T Cell Abs (/uL)  Date Value  08/08/2020 125 (L)  03/27/2020 114 (L)  09/06/2019 180 (L)     Health Maintenance  Topic Date Due  . COVID-19 Vaccine (1) Never done  . COLONOSCOPY (Pts 45-55yrs Insurance coverage will need to be confirmed)  Never done  . TETANUS/TDAP  12/21/2017  . INFLUENZA VACCINE  07/02/2020  . Hepatitis C Screening  Completed  . HIV Screening  Completed    Review of Systems  Constitutional: Negative for chills, fever and weight loss.  Respiratory: Negative for cough and shortness of breath.   Gastrointestinal: Negative for constipation and diarrhea.  Genitourinary: Negative for dysuria.  Psychiatric/Behavioral: The patient does not have insomnia.   he has prev taken megace but did not  like Please see HPI. All other systems reviewed and negative.     Objective:  Physical Exam Vitals reviewed.  Constitutional:      General: He is not in acute distress.    Appearance: Normal appearance. He is not ill-appearing or toxic-appearing.  HENT:     Mouth/Throat:     Mouth: Mucous membranes are  moist.     Pharynx: No oropharyngeal exudate.  Eyes:     Extraocular Movements: Extraocular movements intact.     Pupils: Pupils are equal, round, and reactive to light.  Cardiovascular:     Rate and Rhythm: Normal rate and regular rhythm.  Pulmonary:     Effort: Pulmonary effort is normal.     Breath sounds: Normal breath sounds.  Abdominal:     General: Bowel sounds are normal. There is no distension.     Palpations: Abdomen is soft.     Tenderness: There is no abdominal tenderness.  Musculoskeletal:     Cervical back: Normal range of motion and neck supple.     Right lower leg: No edema.     Left lower leg: No edema.  Neurological:     General: No focal deficit present.     Mental Status: He is alert.  Psychiatric:        Mood and Affect: Mood normal.            Assessment & Plan:

## 2021-01-15 LAB — T-HELPER CELLS (CD4) COUNT (NOT AT ARMC)
Absolute CD4: 153 cells/uL — ABNORMAL LOW (ref 490–1740)
CD4 T Helper %: 16 % — ABNORMAL LOW (ref 30–61)
Total lymphocyte count: 966 cells/uL (ref 850–3900)

## 2021-01-23 LAB — COMPREHENSIVE METABOLIC PANEL
AG Ratio: 1.1 (calc) (ref 1.0–2.5)
ALT: 27 U/L (ref 9–46)
AST: 22 U/L (ref 10–40)
Albumin: 3.5 g/dL — ABNORMAL LOW (ref 3.6–5.1)
Alkaline phosphatase (APISO): 64 U/L (ref 36–130)
BUN: 13 mg/dL (ref 7–25)
CO2: 30 mmol/L (ref 20–32)
Calcium: 8.6 mg/dL (ref 8.6–10.3)
Chloride: 108 mmol/L (ref 98–110)
Creat: 1.12 mg/dL (ref 0.60–1.35)
Globulin: 3.1 g/dL (calc) (ref 1.9–3.7)
Glucose, Bld: 72 mg/dL (ref 65–99)
Potassium: 4.2 mmol/L (ref 3.5–5.3)
Sodium: 139 mmol/L (ref 135–146)
Total Bilirubin: 0.3 mg/dL (ref 0.2–1.2)
Total Protein: 6.6 g/dL (ref 6.1–8.1)

## 2021-01-23 LAB — HIV-1 RNA ULTRAQUANT REFLEX TO GENTYP+
HIV 1 RNA Quant: 30700 copies/mL — ABNORMAL HIGH
HIV-1 RNA Quant, Log: 4.49 Log copies/mL — ABNORMAL HIGH

## 2021-01-23 LAB — RPR: RPR Ser Ql: NONREACTIVE

## 2021-01-23 LAB — HIV-1 INTEGRASE GENOTYPE

## 2021-01-23 LAB — CBC
HCT: 38 % — ABNORMAL LOW (ref 38.5–50.0)
Hemoglobin: 12.6 g/dL — ABNORMAL LOW (ref 13.2–17.1)
MCH: 29.5 pg (ref 27.0–33.0)
MCHC: 33.2 g/dL (ref 32.0–36.0)
MCV: 89 fL (ref 80.0–100.0)
MPV: 11.4 fL (ref 7.5–12.5)
Platelets: 192 10*3/uL (ref 140–400)
RBC: 4.27 10*6/uL (ref 4.20–5.80)
RDW: 12.4 % (ref 11.0–15.0)
WBC: 2.6 10*3/uL — ABNORMAL LOW (ref 3.8–10.8)

## 2021-01-23 LAB — HIV-1 GENOTYPE: HIV-1 Genotype: DETECTED — AB

## 2021-01-25 MED FILL — TIVICAY 50 MG TABLET: 50 | 30 days supply | Qty: 30 | Fill #6

## 2021-01-25 MED FILL — SYMTUZA 800-150-200-10 MG T: 800-150-200 | 30 days supply | Qty: 30 | Fill #6

## 2021-02-12 ENCOUNTER — Other Ambulatory Visit: Payer: Self-pay

## 2021-02-12 ENCOUNTER — Ambulatory Visit (INDEPENDENT_AMBULATORY_CARE_PROVIDER_SITE_OTHER): Payer: Medicare Other

## 2021-02-12 DIAGNOSIS — Z23 Encounter for immunization: Secondary | ICD-10-CM

## 2021-02-12 NOTE — Progress Notes (Signed)
   Covid-19 Vaccination Clinic  Name:  CLEDIS SOHN    MRN: [id number] DOB: [birth year]  02/12/2021  Mr. Plog was observed post Covid-19 immunization for 15 minutes without incident. He was provided with Vaccine Information Sheet and instruction to access the V-Safe system.   Mr. Kolton was instructed to call 911 with any severe reactions post vaccine: Marland Kitchen Difficulty breathing  . Swelling of face and throat  . A fast heartbeat  . A bad rash all over body  . Dizziness and weakness     Beryle Flock, RN

## 2021-02-19 MED FILL — TIVICAY 50 MG TABLET: 50 | 30 days supply | Qty: 30 | Fill #6

## 2021-02-19 MED FILL — SYMTUZA 800-150-200-10 MG T: 800-150-200 | 30 days supply | Qty: 30 | Fill #6

## 2021-02-23 ENCOUNTER — Other Ambulatory Visit (HOSPITAL_COMMUNITY): Payer: Self-pay

## 2021-03-14 ENCOUNTER — Other Ambulatory Visit (HOSPITAL_COMMUNITY): Payer: Self-pay

## 2021-03-14 MED FILL — Darunavir-Cobic-Emtricitab-Tenofov AF Tab 800-150-200-10 MG: ORAL | 30 days supply | Qty: 90 | Fill #0 | Status: CN

## 2021-03-14 MED FILL — Dolutegravir Sodium Tab 50 MG (Base Equiv): ORAL | 30 days supply | Qty: 90 | Fill #0 | Status: CN

## 2021-03-20 ENCOUNTER — Other Ambulatory Visit (HOSPITAL_COMMUNITY): Payer: Self-pay

## 2021-03-22 ENCOUNTER — Other Ambulatory Visit (HOSPITAL_COMMUNITY): Payer: Self-pay

## 2021-03-22 MED FILL — Dolutegravir Sodium Tab 50 MG (Base Equiv): ORAL | 30 days supply | Qty: 30 | Fill #0 | Status: CN

## 2021-03-22 MED FILL — Darunavir-Cobic-Emtricitab-Tenofov AF Tab 800-150-200-10 MG: ORAL | 30 days supply | Qty: 30 | Fill #0 | Status: CN

## 2021-04-02 ENCOUNTER — Other Ambulatory Visit (HOSPITAL_COMMUNITY): Payer: Self-pay

## 2021-04-02 MED FILL — Dolutegravir Sodium Tab 50 MG (Base Equiv): ORAL | 30 days supply | Qty: 30 | Fill #0 | Status: AC

## 2021-04-02 MED FILL — Darunavir-Cobic-Emtricitab-Tenofov AF Tab 800-150-200-10 MG: ORAL | 30 days supply | Qty: 30 | Fill #0 | Status: AC

## 2021-04-10 ENCOUNTER — Other Ambulatory Visit (HOSPITAL_COMMUNITY): Payer: Self-pay

## 2021-04-12 ENCOUNTER — Other Ambulatory Visit (HOSPITAL_COMMUNITY): Payer: Self-pay

## 2021-04-17 ENCOUNTER — Other Ambulatory Visit (HOSPITAL_COMMUNITY): Payer: Self-pay

## 2021-04-23 ENCOUNTER — Other Ambulatory Visit (HOSPITAL_COMMUNITY): Payer: Self-pay

## 2021-04-27 ENCOUNTER — Other Ambulatory Visit (HOSPITAL_COMMUNITY): Payer: Self-pay

## 2021-05-14 ENCOUNTER — Other Ambulatory Visit: Payer: Self-pay | Admitting: Family

## 2021-05-14 DIAGNOSIS — B20 Human immunodeficiency virus [HIV] disease: Secondary | ICD-10-CM

## 2021-05-15 ENCOUNTER — Other Ambulatory Visit: Payer: Self-pay | Admitting: Infectious Diseases

## 2021-05-15 DIAGNOSIS — B20 Human immunodeficiency virus [HIV] disease: Secondary | ICD-10-CM

## 2021-06-13 ENCOUNTER — Other Ambulatory Visit: Payer: Self-pay | Admitting: Infectious Diseases

## 2021-06-13 ENCOUNTER — Other Ambulatory Visit (HOSPITAL_COMMUNITY): Payer: Self-pay

## 2021-06-13 DIAGNOSIS — B20 Human immunodeficiency virus [HIV] disease: Secondary | ICD-10-CM

## 2021-06-13 MED ORDER — SYMTUZA 800-150-200-10 MG PO TABS
1.0000 | ORAL_TABLET | Freq: Every day | ORAL | 1 refills | Status: DC
  Filled 2021-06-13 – 2021-07-03 (×2): qty 30, 30d supply, fill #0

## 2021-06-13 MED ORDER — TIVICAY 50 MG PO TABS
ORAL_TABLET | Freq: Every day | ORAL | 1 refills | Status: DC
  Filled 2021-06-13 – 2021-07-03 (×2): qty 30, 30d supply, fill #0

## 2021-06-22 ENCOUNTER — Other Ambulatory Visit (HOSPITAL_COMMUNITY): Payer: Self-pay

## 2021-07-03 ENCOUNTER — Other Ambulatory Visit (HOSPITAL_COMMUNITY): Payer: Self-pay

## 2021-07-13 ENCOUNTER — Ambulatory Visit (INDEPENDENT_AMBULATORY_CARE_PROVIDER_SITE_OTHER): Payer: Medicare Other | Admitting: Infectious Diseases

## 2021-07-13 ENCOUNTER — Encounter: Payer: Self-pay | Admitting: Infectious Diseases

## 2021-07-13 ENCOUNTER — Other Ambulatory Visit (HOSPITAL_COMMUNITY): Payer: Self-pay

## 2021-07-13 ENCOUNTER — Other Ambulatory Visit: Payer: Self-pay

## 2021-07-13 VITALS — BP 99/62 | HR 78 | Temp 98.6°F | Ht 75.0 in | Wt 134.6 lb

## 2021-07-13 DIAGNOSIS — Z720 Tobacco use: Secondary | ICD-10-CM | POA: Diagnosis not present

## 2021-07-13 DIAGNOSIS — Z23 Encounter for immunization: Secondary | ICD-10-CM

## 2021-07-13 DIAGNOSIS — N182 Chronic kidney disease, stage 2 (mild): Secondary | ICD-10-CM

## 2021-07-13 DIAGNOSIS — Z79899 Other long term (current) drug therapy: Secondary | ICD-10-CM | POA: Diagnosis not present

## 2021-07-13 DIAGNOSIS — B20 Human immunodeficiency virus [HIV] disease: Secondary | ICD-10-CM | POA: Diagnosis not present

## 2021-07-13 DIAGNOSIS — Z113 Encounter for screening for infections with a predominantly sexual mode of transmission: Secondary | ICD-10-CM

## 2021-07-13 DIAGNOSIS — K089 Disorder of teeth and supporting structures, unspecified: Secondary | ICD-10-CM

## 2021-07-13 DIAGNOSIS — C210 Malignant neoplasm of anus, unspecified: Secondary | ICD-10-CM

## 2021-07-13 MED ORDER — TIVICAY 50 MG PO TABS
ORAL_TABLET | Freq: Every day | ORAL | 3 refills | Status: DC
Start: 2021-07-13 — End: 2022-07-30
  Filled 2021-07-13: qty 90, fill #0
  Filled 2021-08-02 – 2021-09-19 (×3): qty 30, 30d supply, fill #0
  Filled 2021-10-08 – 2021-11-29 (×3): qty 30, 30d supply, fill #1
  Filled 2021-12-20 – 2022-02-12 (×3): qty 30, 30d supply, fill #2
  Filled 2022-03-08 – 2022-04-18 (×2): qty 30, 30d supply, fill #3

## 2021-07-13 MED ORDER — SYMTUZA 800-150-200-10 MG PO TABS
1.0000 | ORAL_TABLET | Freq: Every day | ORAL | 3 refills | Status: DC
  Filled 2021-07-13: qty 90, 90d supply, fill #0
  Filled 2021-08-02 – 2021-09-19 (×3): qty 30, 30d supply, fill #0
  Filled 2021-10-08 – 2021-11-29 (×3): qty 30, 30d supply, fill #1
  Filled 2021-12-20 – 2022-02-12 (×3): qty 30, 30d supply, fill #2
  Filled 2022-03-08 – 2022-04-18 (×2): qty 30, 30d supply, fill #3

## 2021-07-13 NOTE — Assessment & Plan Note (Addendum)
Back on ART Recent geno unrevealing. [id number].  Given condoms Refer to dental (Dental referral placed today for [facility]. Information to schedule appointment completed today.) Will see him back in 6 months adherence encouraged.  tdap today.

## 2021-07-13 NOTE — Progress Notes (Signed)
   Subjective:    Patient ID: Juan Harrison, male  DOB: [birth year], 50 y.o.        MRN: [id number]   HPI 50 yo M with hx of HIV/AIDS. Dx ~2007. ART hx- Darunavir-norvir/truvada/Epzicom bactrim (Hx of non-compliance).  He was changed to genvoya 05-2016. Switched to Symtuza/descovey 04-2020.  He has been seen by [facility]c/Rad Onc for anal cancer. Finished CTX and XRT 01-2017.  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   He has been off medication until 1 week ago.  He had run out but rx was still active.   [facility]- believes he had f/u within the calendar year.  "It was fine".   HIV 1 RNA Quant  Date Value  01/12/2021 30,700 copies/mL (H)  08/08/2020 <20 Copies/mL (H)  07/06/2020 8,840 copies/mL (H)   CD4 (no units)  Date Value  12/11/2016 See Separate Report   CD4 T Cell Abs (/uL)  Date Value  08/08/2020 125 (L)  03/27/2020 114 (L)  09/06/2019 180 (L)     Health Maintenance  Topic Date Due   COLONOSCOPY (Pts 45-34yr Insurance coverage will need to be confirmed)  Never done   TETANUS/TDAP  12/21/2017   COVID-19 Vaccine (3 - Pfizer risk series) 03/12/2021   INFLUENZA VACCINE  07/02/2021   Pneumococcal Vaccine 019638Years old (4 - PPSV23 or PCV20) 10/14/2037   Hepatitis C Screening  Completed   HIV Screening  Completed   HPV VACCINES  Aged Out    Review of Systems  Constitutional:  Positive for weight loss. Negative for chills and fever.  Respiratory:  Positive for cough (at night). Negative for shortness of breath.   Gastrointestinal:  Negative for blood in stool, constipation and diarrhea.  Genitourinary:  Negative for dysuria and hematuria.   Please see HPI. All other systems reviewed and negative.     Objective:  Physical Exam Constitutional:      Appearance: He is not ill-appearing or diaphoretic.  HENT:     Mouth/Throat:     Mouth: Mucous membranes are moist.     Pharynx: No oropharyngeal exudate.  Eyes:     Extraocular Movements:  Extraocular movements intact.     Pupils: Pupils are equal, round, and reactive to light.  Cardiovascular:     Rate and Rhythm: Normal rate and regular rhythm.  Pulmonary:     Effort: Pulmonary effort is normal.     Breath sounds: Normal breath sounds.  Musculoskeletal:        General: Normal range of motion.     Cervical back: Normal range of motion and neck supple.     Right lower leg: No edema.     Left lower leg: No edema.  Neurological:     General: No focal deficit present.     Mental Status: He is alert.           Assessment & Plan:

## 2021-07-13 NOTE — Assessment & Plan Note (Signed)
Will refer to dental.

## 2021-07-13 NOTE — Addendum Note (Signed)
Addended by: Leatrice Jewels on: 07/13/2021 11:23 AM   Modules accepted: Orders

## 2021-07-13 NOTE — Assessment & Plan Note (Signed)
Quit tobacco.  Encouraged.

## 2021-07-13 NOTE — Assessment & Plan Note (Signed)
Defers colonoscopy Appreciate [facility] f/u.

## 2021-07-13 NOTE — Assessment & Plan Note (Signed)
Cr nl.  Will continue to monitor.

## 2021-07-31 ENCOUNTER — Other Ambulatory Visit (HOSPITAL_COMMUNITY): Payer: Self-pay

## 2021-08-02 ENCOUNTER — Other Ambulatory Visit (HOSPITAL_COMMUNITY): Payer: Self-pay

## 2021-08-07 ENCOUNTER — Other Ambulatory Visit (HOSPITAL_COMMUNITY): Payer: Self-pay

## 2021-08-16 ENCOUNTER — Other Ambulatory Visit (HOSPITAL_COMMUNITY): Payer: Self-pay

## 2021-08-31 ENCOUNTER — Other Ambulatory Visit (HOSPITAL_COMMUNITY): Payer: Self-pay

## 2021-09-10 ENCOUNTER — Other Ambulatory Visit (HOSPITAL_COMMUNITY): Payer: Self-pay

## 2021-09-19 ENCOUNTER — Other Ambulatory Visit (HOSPITAL_COMMUNITY): Payer: Self-pay

## 2021-09-30 IMAGING — CR DG CHEST 2V
2 series · 2 of 2 positions shown · non-contrast
Comparison: [DATE]st 1212

CLINICAL DATA: Shortness of breath.  Chest pain.

EXAM:
CHEST - 2 VIEW

[chest pa]
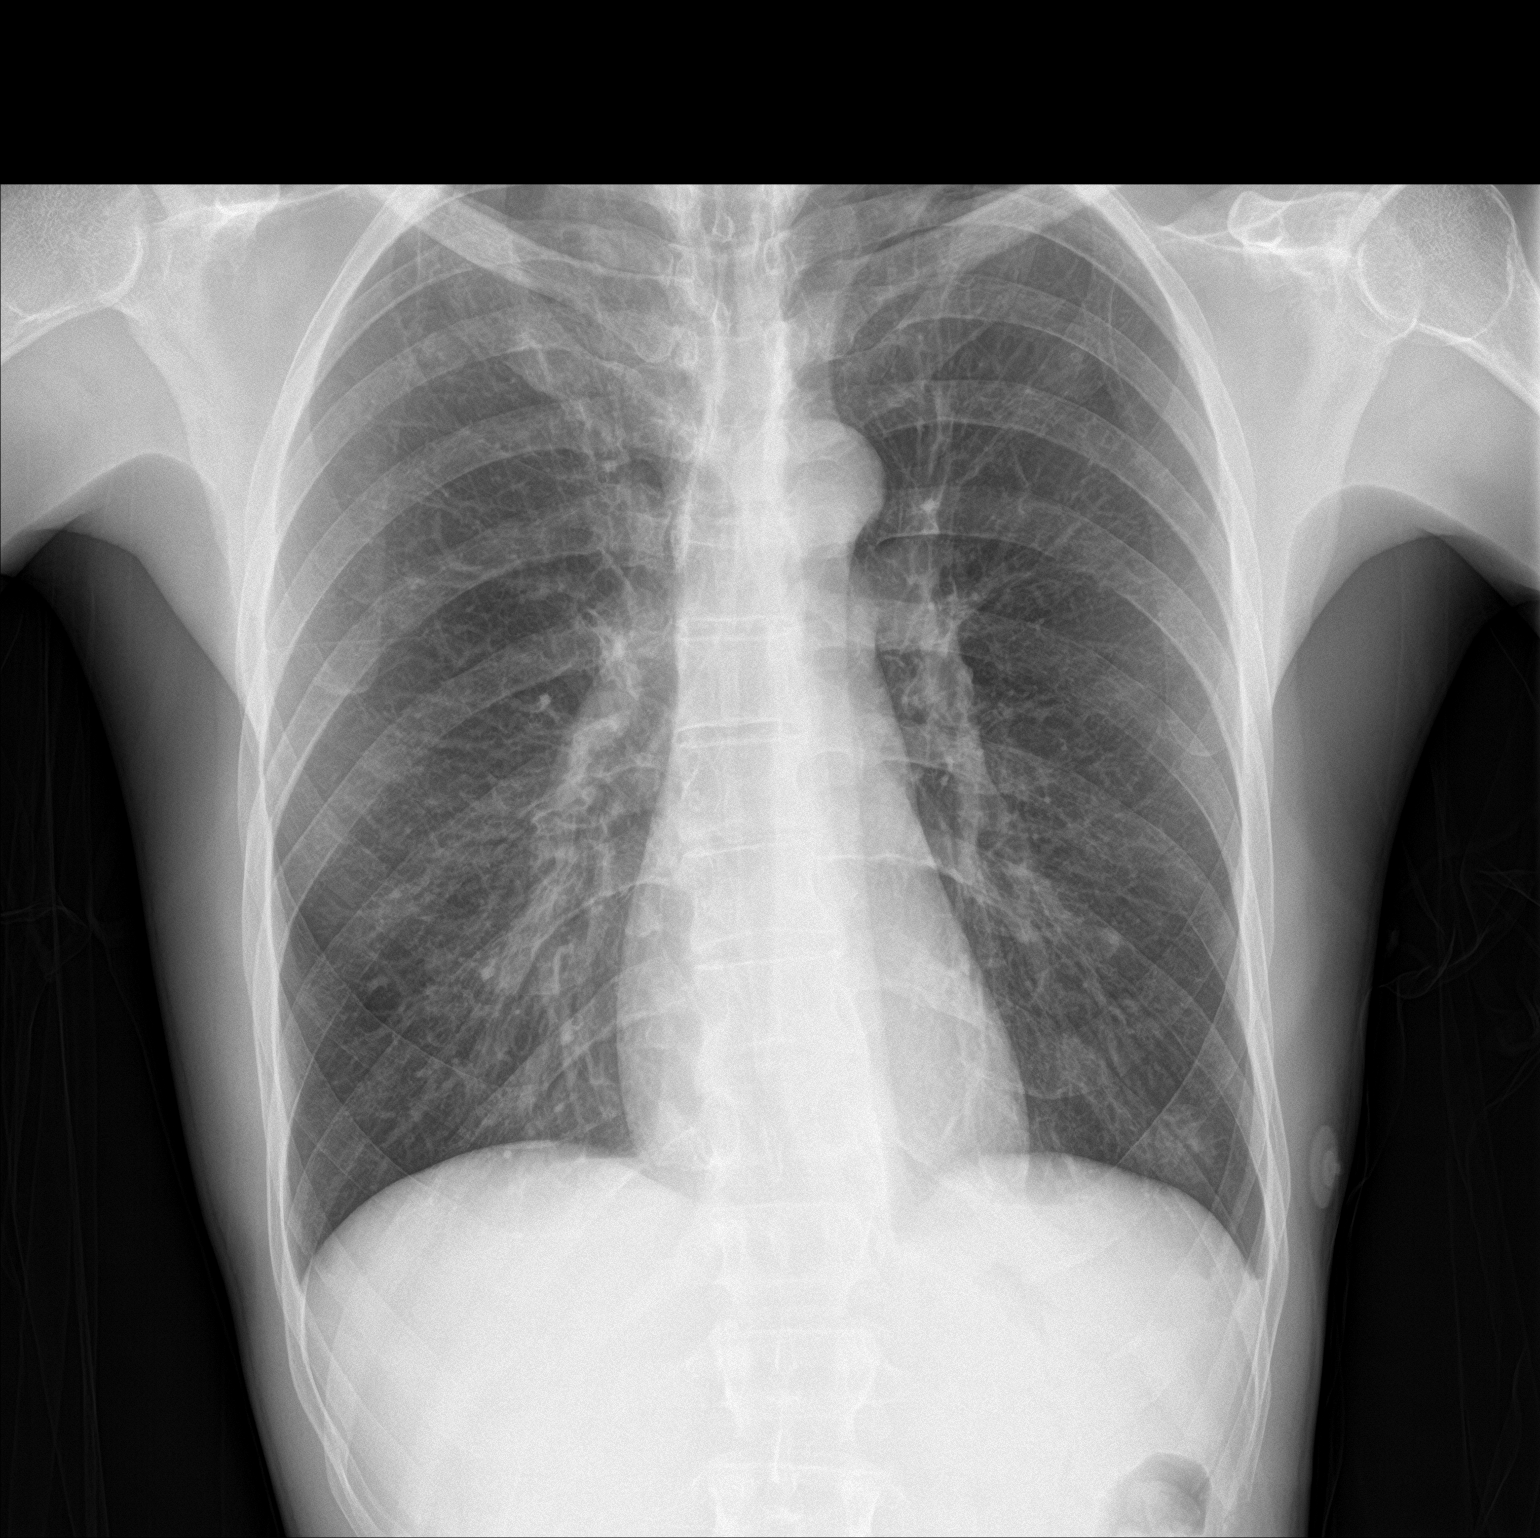

[chest lat]
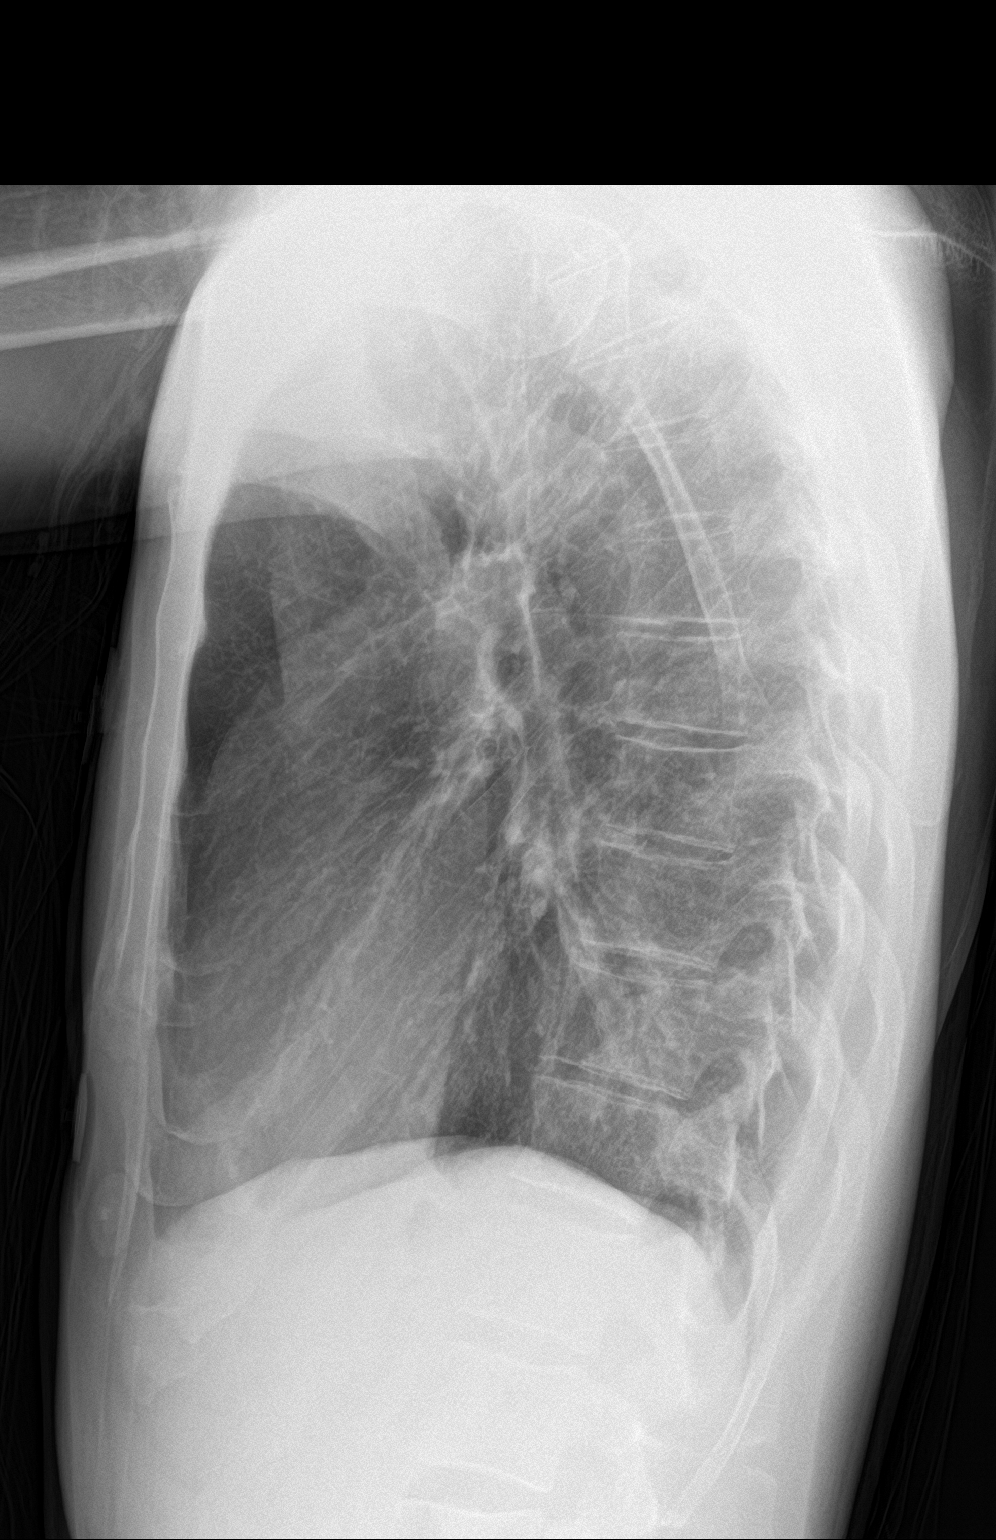

[2 of 2 positions shown; findings below may reference images not displayed]

FINDINGS: The heart size and mediastinal contours are within normal limits.
Both lungs are clear. The visualized skeletal structures are
unremarkable.
IMPRESSION: No active cardiopulmonary disease.

## 2021-10-08 ENCOUNTER — Other Ambulatory Visit (HOSPITAL_COMMUNITY): Payer: Self-pay

## 2021-10-09 ENCOUNTER — Other Ambulatory Visit (HOSPITAL_COMMUNITY): Payer: Self-pay

## 2021-10-19 ENCOUNTER — Other Ambulatory Visit (HOSPITAL_COMMUNITY): Payer: Self-pay

## 2021-10-30 ENCOUNTER — Other Ambulatory Visit (HOSPITAL_COMMUNITY): Payer: Self-pay

## 2021-10-31 ENCOUNTER — Other Ambulatory Visit (HOSPITAL_COMMUNITY): Payer: Self-pay

## 2021-11-06 ENCOUNTER — Other Ambulatory Visit (HOSPITAL_COMMUNITY): Payer: Self-pay

## 2021-11-15 ENCOUNTER — Other Ambulatory Visit (HOSPITAL_COMMUNITY): Payer: Self-pay

## 2021-11-29 ENCOUNTER — Other Ambulatory Visit (HOSPITAL_COMMUNITY): Payer: Self-pay

## 2021-12-03 ENCOUNTER — Other Ambulatory Visit (HOSPITAL_COMMUNITY): Payer: Self-pay

## 2021-12-20 ENCOUNTER — Other Ambulatory Visit (HOSPITAL_COMMUNITY): Payer: Self-pay

## 2021-12-31 ENCOUNTER — Other Ambulatory Visit (HOSPITAL_COMMUNITY): Payer: Self-pay

## 2022-01-09 ENCOUNTER — Other Ambulatory Visit (HOSPITAL_COMMUNITY): Payer: Self-pay

## 2022-01-18 ENCOUNTER — Ambulatory Visit: Payer: Medicare Other | Admitting: Infectious Diseases

## 2022-01-21 ENCOUNTER — Other Ambulatory Visit (HOSPITAL_COMMUNITY): Payer: Self-pay

## 2022-01-23 ENCOUNTER — Other Ambulatory Visit (HOSPITAL_COMMUNITY): Payer: Self-pay

## 2022-01-24 ENCOUNTER — Ambulatory Visit: Payer: Medicare Other | Admitting: Infectious Diseases

## 2022-01-31 ENCOUNTER — Other Ambulatory Visit (HOSPITAL_COMMUNITY): Payer: Self-pay

## 2022-02-12 ENCOUNTER — Other Ambulatory Visit (HOSPITAL_COMMUNITY): Payer: Self-pay

## 2022-02-14 ENCOUNTER — Other Ambulatory Visit (HOSPITAL_COMMUNITY): Payer: Self-pay

## 2022-03-07 ENCOUNTER — Other Ambulatory Visit (HOSPITAL_COMMUNITY): Payer: Self-pay

## 2022-03-08 ENCOUNTER — Other Ambulatory Visit (HOSPITAL_COMMUNITY): Payer: Self-pay

## 2022-03-18 ENCOUNTER — Other Ambulatory Visit (HOSPITAL_COMMUNITY): Payer: Self-pay

## 2022-03-29 ENCOUNTER — Other Ambulatory Visit (HOSPITAL_COMMUNITY): Payer: Self-pay

## 2022-04-02 ENCOUNTER — Other Ambulatory Visit (HOSPITAL_COMMUNITY): Payer: Self-pay

## 2022-04-04 ENCOUNTER — Other Ambulatory Visit (HOSPITAL_COMMUNITY): Payer: Self-pay

## 2022-04-08 ENCOUNTER — Other Ambulatory Visit (HOSPITAL_COMMUNITY): Payer: Self-pay

## 2022-04-18 ENCOUNTER — Telehealth: Payer: Self-pay

## 2022-04-18 ENCOUNTER — Other Ambulatory Visit (HOSPITAL_COMMUNITY): Payer: Self-pay

## 2022-04-18 NOTE — Telephone Encounter (Signed)
[facility] Patient Advocate Encounter   I was successful in securing patient a $ 7500.00 grant from [employer] to provide copayment coverage for [place].  The patient's out of pocket cost will be $0.00 monthly.     I have spoken with the patient.    The billing information is as follows and has been shared with [facility].       Dates of Eligibility: 04/18/22 through 12/01/22  Patient knows to call the office with questions or concerns.  Ileene Patrick, [facility] Patient [facility] Phone: [phone] Fax:  [phone]

## 2022-04-25 ENCOUNTER — Encounter: Payer: Self-pay | Admitting: Infectious Diseases

## 2022-05-07 ENCOUNTER — Other Ambulatory Visit (HOSPITAL_COMMUNITY): Payer: Self-pay

## 2022-05-10 ENCOUNTER — Other Ambulatory Visit (HOSPITAL_COMMUNITY): Payer: Self-pay

## 2022-05-13 ENCOUNTER — Other Ambulatory Visit (HOSPITAL_COMMUNITY): Payer: Self-pay

## 2022-05-16 ENCOUNTER — Other Ambulatory Visit (HOSPITAL_COMMUNITY): Payer: Self-pay

## 2022-06-13 ENCOUNTER — Ambulatory Visit: Payer: Medicare Other | Admitting: Internal Medicine

## 2022-07-30 ENCOUNTER — Other Ambulatory Visit: Payer: Self-pay

## 2022-07-30 ENCOUNTER — Ambulatory Visit (INDEPENDENT_AMBULATORY_CARE_PROVIDER_SITE_OTHER): Payer: Medicare Other | Admitting: Internal Medicine

## 2022-07-30 ENCOUNTER — Other Ambulatory Visit (HOSPITAL_COMMUNITY): Payer: Self-pay

## 2022-07-30 ENCOUNTER — Encounter: Payer: Self-pay | Admitting: Internal Medicine

## 2022-07-30 VITALS — BP 101/66 | HR 78 | Temp 97.7°F | Ht 75.0 in | Wt 134.0 lb

## 2022-07-30 DIAGNOSIS — Z113 Encounter for screening for infections with a predominantly sexual mode of transmission: Secondary | ICD-10-CM

## 2022-07-30 DIAGNOSIS — B180 Chronic viral hepatitis B with delta-agent: Secondary | ICD-10-CM | POA: Diagnosis not present

## 2022-07-30 DIAGNOSIS — B20 Human immunodeficiency virus [HIV] disease: Secondary | ICD-10-CM

## 2022-07-30 MED ORDER — TIVICAY 50 MG PO TABS
ORAL_TABLET | Freq: Every day | ORAL | 3 refills | Status: DC
  Filled 2022-07-30: qty 90, fill #0
  Filled 2022-07-30: qty 30, 30d supply, fill #0
  Filled 2022-08-27 – 2022-09-25 (×2): qty 30, 30d supply, fill #1
  Filled 2022-10-14: qty 30, 30d supply, fill #2
  Filled 2022-11-15: qty 30, 30d supply, fill #3
  Filled 2022-12-25 – 2023-01-16 (×2): qty 30, 30d supply, fill #4
  Filled 2023-02-12 – 2023-03-11 (×2): qty 30, 30d supply, fill #5
  Filled 2023-04-18: qty 30, 30d supply, fill #6
  Filled 2023-07-08: qty 30, 30d supply, fill #7

## 2022-07-30 MED ORDER — SYMTUZA 800-150-200-10 MG PO TABS
1.0000 | ORAL_TABLET | Freq: Every day | ORAL | 3 refills | Status: DC
  Filled 2022-07-30: qty 30, 30d supply, fill #0
  Filled 2022-07-30: qty 90, 90d supply, fill #0
  Filled 2022-08-27 – 2022-09-25 (×2): qty 30, 30d supply, fill #1
  Filled 2022-10-14: qty 30, 30d supply, fill #2
  Filled 2022-11-15: qty 30, 30d supply, fill #3
  Filled 2022-12-25 – 2023-01-16 (×2): qty 30, 30d supply, fill #4
  Filled 2023-02-12 – 2023-03-11 (×2): qty 30, 30d supply, fill #5
  Filled 2023-04-18: qty 30, 30d supply, fill #6
  Filled 2023-07-08: qty 30, 30d supply, fill #7

## 2022-07-30 NOTE — Patient Instructions (Signed)
Labs are already ordered, please let the front desk know when you want to do it so they can put you on lab schedyule   Please make an appointment with me in 3 months   Continue symtuza and tivicay. I have placed a new prescription to [facility]

## 2022-07-30 NOTE — Progress Notes (Signed)
Subjective:    Patient ID: Juan Harrison, male  DOB: 19-May-1971, 51 y.o.        MRN: [id number]   HPI 51 yo M with hx of HIV/AIDS. Dx ~2007. ART hx- Darunavir-norvir/truvada/Epzicom bactrim (Hx of non-compliance).  He was changed to genvoya 05-2016. Switched to Symtuza/descovey 04-2020.  He has been seen by [facility]/Onc/Rad Onc for anal cancer. Finished CTX and XRT 01-2017.  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   He has been off medication until 1 week ago.  He had run out but rx was still active.   CCS- believes he had f/u within the calendar year.  "It was fine".    07/30/22 id clinic f/u He appears to be taking symtuza and tivicay for the past year He has been well controlled up to early 2021 but "life" happened. He reports to be in good situation now and able to take medication regularly Missed 2-3 doses the last 4 weeks.   Hx anal cancer documented 2018 Per patient treated by [facility]CS/onc/rad-onc 01/2021 dr Algis Downs note --> chemo and radiation 01/2017 No recurrence as of 2021  Hx ckd3 Cr around 1.1-1.2 in 2021-2022  Social: Lives alone in [place] Few cousins and a brother in [place] Own a car and drives Not in a relationship at this time; "not really" sexually active Not working  On disability Smoke some kind of cigarettes; no other street drugs; etoh on weekend  No other complaint He has a dental appointment coming up at [facility]. Has dental carries and some pain; no fever/headache   HIV 1 RNA Quant  Date Value  01/12/2021 30,700 copies/mL (H)  08/08/2020 <20 Copies/mL (H)  07/06/2020 8,840 copies/mL (H)   CD4 (no units)  Date Value  12/11/2016 See Separate Report   CD4 T Cell Abs (/uL)  Date Value  08/08/2020 125 (L)  03/27/2020 114 (L)  09/06/2019 180 (L)     Health Maintenance  Topic Date Due   COLONOSCOPY (Pts 45-69yr Insurance coverage will need to be confirmed)  Never done   COVID-19 Vaccine (3 - Pfizer risk  series) 03/12/2021   INFLUENZA VACCINE  07/02/2022   TETANUS/TDAP  07/14/2031   Hepatitis C Screening  Completed   HIV Screening  Completed   HPV VACCINES  Aged Out    ROS: No fever chill weight loss No rash No anal lesion No urethral discharge No cough No chest pain No abd pain No n/v/diarrhea -- bloody stool/black stool   Please see HPI. All other systems reviewed and negative.     Objective:   Vitals:   07/30/22 1401  BP: 101/66  Pulse: 78  Temp: 97.7 F (36.5 C)  SpO2: 100%   General/constitutional: no distress, pleasant HEENT: Normocephalic, PER, conj clear; didn't want me to take a look at his teeth Neck supple CV: rrr no mrg Lungs: clear to auscultation, normal respiratory effort Abd: Soft, Nontender Ext: no edema Skin: reports some 'bumps' in buttock but didn't want to be looked at Neuro: nonfocal MSK: no peripheral joint swelling/tenderness/warmth; back spines nontender            Assessment & Plan:   #hiv On symtuza and tivicay for concern hx off and on meds [facility] almost a month ago  Doesn't want labs today but will drop by within the next few weeks    -discussed u=u -encourage compliance -continue current HIV medication -labs within next few weeks including std labs  screen -f/u in 3 months -reordered symtuza/tivicay rx to [facility]   #hx anal cancer Treated 2018 by xrt/cxt No evidence disease  #ckd Baseline cr 1.1-1.2  #hcm -dental Patient has appointment September with [facility] -vaccinations Will review -hepatitis Hx hep b -- will repeat testing -std Labs ordered including triple screen -cancer screening Will discuss (colorectal and anal cancer)    I have spent a total of 35 minutes of face-to-face and non-face-to-face time, excluding clinical staff time, preparing to see patient, ordering tests and/or medications, and provide counseling the patient

## 2022-08-07 ENCOUNTER — Other Ambulatory Visit (HOSPITAL_COMMUNITY): Payer: Self-pay

## 2022-08-08 ENCOUNTER — Other Ambulatory Visit: Payer: Medicare Other

## 2022-08-08 ENCOUNTER — Other Ambulatory Visit: Payer: Self-pay

## 2022-08-08 ENCOUNTER — Other Ambulatory Visit (HOSPITAL_COMMUNITY): Payer: Self-pay

## 2022-08-08 DIAGNOSIS — B180 Chronic viral hepatitis B with delta-agent: Secondary | ICD-10-CM

## 2022-08-08 DIAGNOSIS — Z113 Encounter for screening for infections with a predominantly sexual mode of transmission: Secondary | ICD-10-CM

## 2022-08-08 DIAGNOSIS — B20 Human immunodeficiency virus [HIV] disease: Secondary | ICD-10-CM

## 2022-08-09 LAB — T-HELPER CELL (CD4) - (RCID CLINIC ONLY)
CD4 % Helper T Cell: 9 % — ABNORMAL LOW (ref 33–65)
CD4 T Cell Abs: 138 /uL — ABNORMAL LOW (ref 400–1790)

## 2022-08-13 LAB — COMPLETE METABOLIC PANEL WITH GFR
AG Ratio: 1 (calc) (ref 1.0–2.5)
ALT: 21 U/L (ref 9–46)
AST: 23 U/L (ref 10–40)
Albumin: 3.2 g/dL — ABNORMAL LOW (ref 3.6–5.1)
Alkaline phosphatase (APISO): 62 U/L (ref 36–130)
BUN: 12 mg/dL (ref 7–25)
CO2: 27 mmol/L (ref 20–32)
Calcium: 8.5 mg/dL — ABNORMAL LOW (ref 8.6–10.3)
Chloride: 106 mmol/L (ref 98–110)
Creat: 0.89 mg/dL (ref 0.60–1.29)
Globulin: 3.2 g/dL (calc) (ref 1.9–3.7)
Glucose, Bld: 84 mg/dL (ref 65–99)
Potassium: 3.7 mmol/L (ref 3.5–5.3)
Sodium: 137 mmol/L (ref 135–146)
Total Bilirubin: 0.4 mg/dL (ref 0.2–1.2)
Total Protein: 6.4 g/dL (ref 6.1–8.1)
eGFR: 105 mL/min/{1.73_m2} (ref >=60)

## 2022-08-13 LAB — HEPATITIS B SURFACE ANTIBODY, QUANTITATIVE: Hep B S AB Quant (Post): <5 m[IU]/mL — ABNORMAL LOW (ref >=10)

## 2022-08-13 LAB — HEPATITIS C ANTIBODY: Hepatitis C Ab: NONREACTIVE

## 2022-08-13 LAB — HEPATITIS B DNA, ULTRAQUANTITATIVE, PCR
Hepatitis B DNA: 217000000 IU/mL — ABNORMAL HIGH
Hepatitis B virus DNA: 8.34 Log IU/mL — ABNORMAL HIGH

## 2022-08-13 LAB — HIV-1 RNA QUANT-NO REFLEX-BLD
HIV 1 RNA Quant: 1940 Copies/mL — ABNORMAL HIGH
HIV-1 RNA Quant, Log: 3.29 Log cps/mL — ABNORMAL HIGH

## 2022-08-13 LAB — HEPATITIS B CORE ANTIBODY, TOTAL: Hep B Core Total Ab: NONREACTIVE

## 2022-08-13 LAB — RPR: RPR Ser Ql: NONREACTIVE

## 2022-08-13 LAB — HEPATITIS B SURFACE ANTIGEN: Hepatitis B Surface Ag: REACTIVE — AB

## 2022-08-14 NOTE — Progress Notes (Signed)
Chronic hep b. High level dna in setting off meds   Will need to recheck hep b dna level as he restarted symtuza/tivicay. Plan for 4-6 weeks from today

## 2022-08-16 ENCOUNTER — Other Ambulatory Visit (HOSPITAL_COMMUNITY): Payer: Self-pay

## 2022-08-20 ENCOUNTER — Encounter (HOSPITAL_COMMUNITY): Payer: Self-pay | Admitting: *Deleted

## 2022-08-20 ENCOUNTER — Ambulatory Visit (HOSPITAL_COMMUNITY)
Admission: EM | Admit: 2022-08-20 | Discharge: 2022-08-20 | Disposition: A | Discharge status: Home / Self Care | Payer: Medicare Other | Attending: Family Medicine | Admitting: Family Medicine

## 2022-08-20 ENCOUNTER — Other Ambulatory Visit (HOSPITAL_COMMUNITY): Payer: Self-pay

## 2022-08-20 DIAGNOSIS — L0291 Cutaneous abscess, unspecified: Secondary | ICD-10-CM

## 2022-08-20 DIAGNOSIS — L02416 Cutaneous abscess of left lower limb: Secondary | ICD-10-CM | POA: Diagnosis not present

## 2022-08-20 MED ORDER — SULFAMETHOXAZOLE-TRIMETHOPRIM 800-160 MG PO TABS
1.0000 | ORAL_TABLET | Freq: Two times a day (BID) | ORAL | 0 refills | Status: AC
Start: 2022-08-20 — End: 2022-08-27
  Filled 2022-08-20: qty 14, 7d supply, fill #0

## 2022-08-20 MED ORDER — HYDROCODONE-ACETAMINOPHEN 5-325 MG PO TABS
2.0000 | ORAL_TABLET | Freq: Four times a day (QID) | ORAL | 0 refills | Status: AC | PRN
  Filled 2022-08-20: qty 10, 2d supply, fill #0

## 2022-08-20 NOTE — ED Provider Notes (Signed)
[facility]    CSN: [id number] Arrival date & time: 08/20/22  0802      History   Chief Complaint Chief Complaint  Patient presents with   Abscess    HPI Juan Harrison is a 51 y.o. male.   Patient is here for an abscess at the left groin/abdominal area.  Noted it about 1 week ago.  Getting bigger, more painful.  Unable to sleep last night due to pain.   No fevers/chills.   Past Medical History:  Diagnosis Date   AIDS [facility]) infectious disease--- dr hatcher   dx 2007,  usCD4,  count at 110   Anal cancer [facility]) oncologist-  dr feng/  dr moody   intial dx 12-10-2016--  Invasive Squamous Cell carcinoma in situ, Stage IIIA ([id number]) --- completed concurrent radiation and chemotherapy (rxt 01-27-2017 chemo 01-24-2017)     Anal condyloma    Hepatitis B surface antigen positive    History of cancer chemotherapy 12-16-2016 to 01-24-2017   History of radiation therapy 12-16-2016 to 01-27-2017   55Gy in 30 sessions   Pulmonary nodule, right    per Chest CT 12-06-2008   Rectal mass    anal    Patient Active Problem List   Diagnosis Date Noted   Poor dentition 07/06/2020   Porokeratosis 05/23/2020   CKD (chronic kidney disease) stage 2, GFR 60-89 ml/min 04/28/2018   Herpes genitalis in men 02/12/2018   Tobacco use 10/13/2017   Anal cancer ([place]) 12/10/2016   Hepatitis B immune 10/29/2016   AIDS ([place]) 12/02/2005    Past Surgical History:  Procedure Laterality Date   IR GENERIC HISTORICAL  12/16/2016   IR US GUIDE VASC ACCESS RIGHT 12/16/2016 Arne Cleveland, MD [facility]   IR GENERIC HISTORICAL  12/16/2016   IR FLUORO GUIDE CV LINE RIGHT 12/16/2016 Arne Cleveland, MD [facility]   RECTAL BIOPSY N/A 11/29/2016   Procedure: BIOPSY ANAL CANAL MASS;  Surgeon: Leighton Ruff, MD;  Location: [facility];  Service: General;  Laterality: N/A;   RECTAL EXAM UNDER ANESTHESIA N/A 11/29/2016   Procedure: EXAM UNDER ANESTHESIA;  Surgeon: Leighton Ruff,  MD;  Location: [facility];  Service: General;  Laterality: N/A;   RECTAL EXAM UNDER ANESTHESIA N/A 08/01/2017   Procedure: ANAL EXAM UNDER ANESTHESIA WITH BIOPSY;  Surgeon: Leighton Ruff, MD;  Location: [facility];  Service: General;  Laterality: N/A;   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 Authorizing Provider  Darunavir-Cobicistat-Emtricitabine-Tenofovir Alafenamide (SYMTUZA) 800-150-200-10 MG TABS TAKE 1 TABLET BY MOUTH DAILY WITH BREAKFAST. 07/30/22 07/30/23 Yes Vu, Johnny Bridge T, MD  dolutegravir (TIVICAY) 50 MG tablet TAKE 1 TABLET (50 MG TOTAL) BY MOUTH DAILY. 07/30/22 07/30/23 Yes Vu, Rockey Situ, MD    Family History Family History  Problem Relation Age of Onset   COPD Mother    Asthma Brother     Social History Social History   Tobacco Use   Smoking status: Every Day    Types: Cigars   Smokeless tobacco: Never   Tobacco comments:    3 black and milds a day  Substance Use Topics   Alcohol use: No    Alcohol/week: 1.0 standard drink of alcohol    Types: 1 Standard drinks or equivalent per week    Comment: previously drank alcohol on weekends for  25 years, he quit on 10/14/2016    Drug use: Not Currently    Types: Marijuana     Allergies   Patient has no known allergies.   Review of Systems Review of Systems  Constitutional: Negative.   HENT: Negative.    Respiratory: Negative.    Cardiovascular: Negative.   Gastrointestinal: Negative.   Skin:  Positive for wound.     Physical Exam Triage Vital Signs ED Triage Vitals  Enc Vitals Group     BP 08/20/22 0815 106/72     Pulse Rate 08/20/22 0815 86     Resp 08/20/22 0815 18     Temp 08/20/22 0815 97.9 F (36.6 C)     Temp Source 08/20/22 0815 Oral     SpO2 08/20/22 0815 99 %     Weight --      Height --      Head Circumference --      Peak Flow --      Pain Score  08/20/22 0814 9     Pain Loc --      Pain Edu? --      Excl. in [place]? --    No data found.  Updated Vital Signs BP 106/72 (BP Location: Left Arm)   Pulse 86   Temp 97.9 F (36.6 C) (Oral)   Resp 18   SpO2 99%   Visual Acuity Right Eye Distance:   Left Eye Distance:   Bilateral Distance:    Right Eye Near:   Left Eye Near:    Bilateral Near:     Physical Exam Constitutional:      Appearance: Normal appearance.  Skin:    Comments: At the left hip is an open sore/lesion;  no obvious drainage at this time but the paper towel is blood tinged.  There is surrounding erythema and tenderness;  no area of fluid/fluctuance is noted  Neurological:     Mental Status: He is alert.      UC Treatments / Results  Labs (all labs ordered are listed, but only abnormal results are displayed) Labs Reviewed - No data to display  EKG   Radiology No results found.  Procedures Procedures (including critical care time)  Medications Ordered in UC Medications - No data to display  Initial Impression / Assessment and Plan / UC Course  I have reviewed the triage vital signs and the nursing notes.  Pertinent labs & imaging results that were available during my care of the patient were reviewed by me and considered in my medical decision making (see chart for details).    Final Clinical Impressions(s) / UC Diagnoses   Final diagnoses:  Abscess     Discharge Instructions      You were seen today for an abscess at the left hip.  This has already opened and drained.  I have sent out an antibiotic to take twice/day.  I have given you a few pain pills to take as well.  You may take over the counter motrin as well.   I recommend a warm compress to the area to help.  Please return if not improving.     ED Prescriptions     Medication Sig Dispense Auth. Provider   sulfamethoxazole-trimethoprim (BACTRIM DS) 800-160 MG tablet Take 1 tablet by mouth 2 (two) times daily for 7 days. 14  tablet Alie Moudy, MD   HYDROcodone-acetaminophen (NORCO/VICODIN) 5-325 MG tablet Take 2 tablets by mouth every 6 (six) hours as needed for up to  2 days. 10 tablet Rondel Oh, MD      PDMP not reviewed this encounter.   Rondel Oh, MD 08/20/22 0830

## 2022-08-20 NOTE — Discharge Instructions (Signed)
You were seen today for an abscess at the left hip.  This has already opened and drained.  I have sent out an antibiotic to take twice/day.  I have given you a few pain pills to take as well.  You may take over the counter motrin as well.   I recommend a warm compress to the area to help.  Please return if not improving.

## 2022-08-20 NOTE — ED Triage Notes (Signed)
Pt states that he has a bump on his left pelvic area since last Wednesday. Pt states cant sleep at night due to pain.

## 2022-08-27 ENCOUNTER — Other Ambulatory Visit (HOSPITAL_COMMUNITY): Payer: Self-pay

## 2022-09-05 ENCOUNTER — Other Ambulatory Visit (HOSPITAL_COMMUNITY): Payer: Self-pay

## 2022-09-25 ENCOUNTER — Other Ambulatory Visit (HOSPITAL_COMMUNITY): Payer: Self-pay

## 2022-09-29 DIAGNOSIS — S339XXA Sprain of unspecified parts of lumbar spine and pelvis, initial encounter: Secondary | ICD-10-CM | POA: Diagnosis not present

## 2022-09-30 ENCOUNTER — Other Ambulatory Visit (HOSPITAL_COMMUNITY): Payer: Self-pay

## 2022-09-30 MED ORDER — PREDNISONE 20 MG PO TABS
ORAL_TABLET | ORAL | 0 refills | Status: AC
  Filled 2022-09-30: qty 25, 10d supply, fill #0

## 2022-10-14 ENCOUNTER — Other Ambulatory Visit (HOSPITAL_COMMUNITY): Payer: Self-pay

## 2022-10-16 ENCOUNTER — Encounter: Payer: Self-pay | Admitting: Internal Medicine

## 2022-10-16 ENCOUNTER — Other Ambulatory Visit: Payer: Self-pay

## 2022-10-16 ENCOUNTER — Ambulatory Visit (INDEPENDENT_AMBULATORY_CARE_PROVIDER_SITE_OTHER): Payer: Medicare Other | Admitting: Internal Medicine

## 2022-10-16 ENCOUNTER — Other Ambulatory Visit (HOSPITAL_COMMUNITY): Payer: Self-pay

## 2022-10-16 VITALS — BP 107/70 | HR 74 | Temp 97.7°F | Ht 75.0 in | Wt 142.0 lb

## 2022-10-16 DIAGNOSIS — B20 Human immunodeficiency virus [HIV] disease: Secondary | ICD-10-CM | POA: Diagnosis not present

## 2022-10-16 DIAGNOSIS — B181 Chronic viral hepatitis B without delta-agent: Secondary | ICD-10-CM

## 2022-10-16 DIAGNOSIS — A630 Anogenital (venereal) warts: Secondary | ICD-10-CM | POA: Diagnosis not present

## 2022-10-16 MED ORDER — IMIQUIMOD 3.75 % EX CREA
1.0000 | TOPICAL_CREAM | Freq: Every day | CUTANEOUS | 5 refills | Status: DC
  Filled 2022-10-16: qty 7.5, 14d supply, fill #0
  Filled 2022-10-22 – 2022-10-28 (×2): qty 7.5, 30d supply, fill #0

## 2022-10-16 NOTE — Progress Notes (Signed)
Subjective:    Patient ID: Juan Harrison, male  DOB: March 20, 1971, 51 y.o.        MRN: [id number]  Cc - f/u hiv  HPI 51 yo M with hx of HIV/AIDS. Dx ~2007. ART hx- Darunavir-norvir/truvada/Epzicom bactrim (Hx of non-compliance).  He was changed to genvoya 05-2016. Switched to Symtuza/descovey 04-2020.  History of anal cancer and has been seen by [facility]CS/Onc/Rad Onc for anal cancer. Finished CTX and XRT 01-2017.    10/16/22 id clinic f/u Missed 1 dose last 4 weeks of symtuza and tivicay Complains of at least months of irritating "scars" around anal area No bleeding there No other concern today  07/30/22 id clinic f/u He appears to be taking symtuza and tivicay for the past year He has been well controlled up to early 2021 but "life" happened. He reports to be in good situation now and able to take medication regularly Missed 2-3 doses the last 4 weeks.   Hx anal cancer documented 2018 Per patient treated by [facility]/onc/rad-onc 01/2021 dr Algis Downs note --> chemo and radiation 01/2017 No recurrence as of 2021  Hx ckd3 Cr around 1.1-1.2 in 2021-2022  Social: Lives alone in [place] Few cousins and a brother in [place] Own a car and drives Not in a relationship at this time; "not really" sexually active Not working  On disability Smoke some kind of cigarettes; no other street drugs; etoh on weekend  No other complaint He has a dental appointment coming up at [facility]. Has dental carries and some pain; no fever/headache   HIV 1 RNA Quant  Date Value  08/08/2022 1,940 Copies/mL (H)  01/12/2021 30,700 copies/mL (H)  08/08/2020 <20 Copies/mL (H)   CD4 (no units)  Date Value  12/11/2016 See Separate Report   CD4 T Cell Abs (/uL)  Date Value  08/08/2022 138 (L)  08/08/2020 125 (L)  03/27/2020 114 (L)     Health Maintenance  Topic Date Due   Zoster Vaccines- Shingrix (1 of 2) Never done   Medicare Annual Wellness (AWV)  01/07/2017   COLONOSCOPY (Pts 45-40yr  Insurance coverage will need to be confirmed)  Never done   COVID-19 Vaccine (3 - Pfizer risk series) 03/12/2021   INFLUENZA VACCINE  07/02/2022   TETANUS/TDAP  07/14/2031   Hepatitis C Screening  Completed   HIV Screening  Completed   HPV VACCINES  Aged Out    ROS: All other ros negative    Objective:   Vitals:   10/16/22 1404  BP: 107/70  Pulse: 74  Temp: 97.7 F (36.5 C)  SpO2: 100%   General/constitutional: no distress, pleasant HEENT: Normocephalic, PER, Conj Clear, EOMI, Oropharynx clear Neck supple CV: rrr no mrg Lungs: clear to auscultation, normal respiratory effort Abd: Soft, Nontender Ext: no edema Skin: multiple small (<0.5 cm) veruceous like papules around anal area nontender and no bleeding and non-ulcerating Neuro: nonfocal MSK: no peripheral joint swelling/tenderness/warmth; back spines nontender              Assessment & Plan:   #hiv On symtuza and tivicay for concern hx off and on meds Last [facility] about almost a month ago  Doesn't want labs today but will drop by within the next few weeks Will see if viral load is normal today on tivicay/symtuza -- appear to be very compliant    -discussed u=u -encourage compliance -continue current HIV medication symtuza/tivicay -labs today and in 3 months post-visit -f/u in 3 months   #chronic  hepatitis B -08/2022 viral load 217,000 -will check lft, and full hep b panel again today 10/16/2022 -continue symtuza component as mentioned -liver u/s ordered   #hx anal cancer #perianal wart Treated 2018 by xrt/cxt No evidence disease of anal cancer as of 2021 f/u with colorectal surgery Today 10/16/22 perianal wart. Discussed they could go away or get bigger. Due to sx of irritation patient wants to treat  -trial of imiquimod. Discussed it could take several weeks to see improvement   #ckd Baseline cr 1.1-1.2  #hcm -dental Patient has appointment September with  [facility] -vaccinations Will review -hepatitis Hx hep b -- will repeat testing -std Labs ordered including triple screen -cancer screening Will discuss (colorectal and anal cancer)    I have spent a total of 30 minutes of face-to-face and non-face-to-face time, excluding clinical staff time, preparing to see patient, ordering tests and/or medications, and provide counseling the patient

## 2022-10-16 NOTE — Patient Instructions (Addendum)
Apply imiquimod cream at night before bedtime; leaves for overnight then washoff with soap/water in the morning. Do this for at least 2-3 months before starting to see result   Labs today  Continue symtuza/tivicay  See me in 3 months  Ultrasound liver to be setup with Joycelyn Schmid at front desk

## 2022-10-19 LAB — CBC
HCT: 39.8 % (ref 38.5–50.0)
Hemoglobin: 13.4 g/dL (ref 13.2–17.1)
MCH: 29.3 pg (ref 27.0–33.0)
MCHC: 33.7 g/dL (ref 32.0–36.0)
MCV: 87.1 fL (ref 80.0–100.0)
MPV: 11.4 fL (ref 7.5–12.5)
Platelets: 185 10*3/uL (ref 140–400)
RBC: 4.57 10*6/uL (ref 4.20–5.80)
RDW: 12.9 % (ref 11.0–15.0)
WBC: 3.3 10*3/uL — ABNORMAL LOW (ref 3.8–10.8)

## 2022-10-19 LAB — HIV-1 RNA QUANT-NO REFLEX-BLD
HIV 1 RNA Quant: 55000 Copies/mL — ABNORMAL HIGH
HIV-1 RNA Quant, Log: 4.74 Log cps/mL — ABNORMAL HIGH

## 2022-10-19 LAB — COMPLETE METABOLIC PANEL WITH GFR
AG Ratio: 1 (calc) (ref 1.0–2.5)
ALT: 13 U/L (ref 9–46)
AST: 17 U/L (ref 10–35)
Albumin: 3.2 g/dL — ABNORMAL LOW (ref 3.6–5.1)
Alkaline phosphatase (APISO): 73 U/L (ref 35–144)
BUN: 11 mg/dL (ref 7–25)
CO2: 31 mmol/L (ref 20–32)
Calcium: 8 mg/dL — ABNORMAL LOW (ref 8.6–10.3)
Chloride: 109 mmol/L (ref 98–110)
Creat: 0.97 mg/dL (ref 0.70–1.30)
Globulin: 3.2 g/dL (calc) (ref 1.9–3.7)
Glucose, Bld: 96 mg/dL (ref 65–99)
Potassium: 3.6 mmol/L (ref 3.5–5.3)
Sodium: 141 mmol/L (ref 135–146)
Total Bilirubin: 0.4 mg/dL (ref 0.2–1.2)
Total Protein: 6.4 g/dL (ref 6.1–8.1)
eGFR: 95 mL/min/{1.73_m2} (ref >=60)

## 2022-10-19 LAB — AFP TUMOR MARKER: AFP-Tumor Marker: 7.3 ng/mL — ABNORMAL HIGH (ref <6.1)

## 2022-10-19 LAB — HEPATITIS B E ANTIGEN: Hep B E Ag: REACTIVE — AB

## 2022-10-19 LAB — HEPATITIS DELTA ANTIBODY: Hepatitis D Ab, Total: NEGATIVE

## 2022-10-19 LAB — HEPATITIS B SURFACE ANTIGEN: Hepatitis B Surface Ag: REACTIVE — AB

## 2022-10-19 LAB — HEPATITIS B DNA, ULTRAQUANTITATIVE, PCR: Hepatitis B virus DNA: >9 Log IU/mL — ABNORMAL HIGH

## 2022-10-19 LAB — HEPATITIS B E ANTIBODY: Hep B E Ab: NONREACTIVE

## 2022-10-22 ENCOUNTER — Other Ambulatory Visit (HOSPITAL_COMMUNITY): Payer: Self-pay

## 2022-10-23 ENCOUNTER — Ambulatory Visit (HOSPITAL_COMMUNITY): Admission: RE | Admit: 2022-10-23 | Payer: Medicare Other | Source: Ambulatory Visit

## 2022-10-28 ENCOUNTER — Other Ambulatory Visit (HOSPITAL_COMMUNITY): Payer: Self-pay

## 2022-10-28 ENCOUNTER — Other Ambulatory Visit: Payer: Self-pay | Admitting: Pharmacist

## 2022-10-28 DIAGNOSIS — A630 Anogenital (venereal) warts: Secondary | ICD-10-CM

## 2022-10-28 MED ORDER — IMIQUIMOD 5 % EX CREA
TOPICAL_CREAM | CUTANEOUS | 0 refills | Status: DC
  Filled 2022-10-28: qty 24, 56d supply, fill #0

## 2022-11-06 ENCOUNTER — Other Ambulatory Visit (HOSPITAL_COMMUNITY): Payer: Self-pay

## 2022-11-08 ENCOUNTER — Telehealth: Payer: Self-pay

## 2022-11-08 NOTE — Telephone Encounter (Signed)
-----   Message from Jabier Mutton, MD sent at 11/08/2022 11:36 AM EST ----- Hi team. This patient is clearly not taking his meds as he had told me otherwise....  Let's see him again in 4 weeks and recheck viral load/genotype  thanks

## 2022-11-08 NOTE — Telephone Encounter (Signed)
Called and spoke with patient. Relayed message that viral load was elevated. Patient stated he had not had any trouble obtaining his medication. Counseled patient to take his medication as prescribed. Scheduled for follow up visit on 11/28/22.   Patient stated he had not been able to fill imiquimod cream, due to pharmacy asking for a $1400 copay. Will route to [facility] pharmacy team.  Binnie Kand, RN

## 2022-11-11 ENCOUNTER — Other Ambulatory Visit: Payer: Self-pay | Admitting: Pharmacist

## 2022-11-11 DIAGNOSIS — A630 Anogenital (venereal) warts: Secondary | ICD-10-CM

## 2022-11-11 MED ORDER — IMIQUIMOD 5 % EX CREA: TOPICAL_CREAM | CUTANEOUS | 0 refills | Status: AC

## 2022-11-11 NOTE — Telephone Encounter (Signed)
It looks like he can get imiquimod 5%, 12 packets for about $16 at [facility] through [place]. I'll send it to [facility] on [address] if you can tell him to check there please. Thanks!

## 2022-11-11 NOTE — Telephone Encounter (Signed)
Attempted to call patient, no voicemail setup.

## 2022-11-13 NOTE — Telephone Encounter (Signed)
Unable to reach patient, vm not setup.

## 2022-11-15 ENCOUNTER — Other Ambulatory Visit: Payer: Self-pay

## 2022-11-15 NOTE — Telephone Encounter (Signed)
Patient ok with that. Please send Rx. Thanks

## 2022-11-19 ENCOUNTER — Ambulatory Visit (HOSPITAL_COMMUNITY)
Admission: RE | Admit: 2022-11-19 | Discharge: 2022-11-19 | Disposition: A | Discharge status: Home / Self Care | Payer: Medicare Other | Source: Ambulatory Visit | Attending: Internal Medicine | Admitting: Internal Medicine

## 2022-11-19 DIAGNOSIS — B181 Chronic viral hepatitis B without delta-agent: Secondary | ICD-10-CM | POA: Diagnosis present

## 2022-11-19 DIAGNOSIS — K7689 Other specified diseases of liver: Secondary | ICD-10-CM | POA: Diagnosis not present

## 2022-11-26 ENCOUNTER — Other Ambulatory Visit (HOSPITAL_COMMUNITY): Payer: Self-pay

## 2022-11-28 ENCOUNTER — Ambulatory Visit (INDEPENDENT_AMBULATORY_CARE_PROVIDER_SITE_OTHER): Payer: Medicare Other | Admitting: Internal Medicine

## 2022-11-28 ENCOUNTER — Encounter: Payer: Self-pay | Admitting: Internal Medicine

## 2022-11-28 ENCOUNTER — Other Ambulatory Visit: Payer: Self-pay

## 2022-11-28 VITALS — BP 120/71 | HR 66 | Temp 97.6°F | Wt 139.2 lb

## 2022-11-28 DIAGNOSIS — B181 Chronic viral hepatitis B without delta-agent: Secondary | ICD-10-CM

## 2022-11-28 DIAGNOSIS — B20 Human immunodeficiency virus [HIV] disease: Secondary | ICD-10-CM | POA: Diagnosis not present

## 2022-11-28 NOTE — Progress Notes (Signed)
Subjective:    Patient ID: Juan Harrison, male  DOB: 1971/03/15, 51 y.o.        MRN: [id number]  Cc - f/u hiv  HPI 51 yo M with hx of HIV/AIDS. Dx ~2007; MSM. ART hx- Darunavir-norvir/truvada/Epzicom bactrim (Hx of non-compliance).  He was changed to genvoya 05-2016. Switched to Symtuza/descovey 04-2020.  History of anal cancer and has been seen by [facility]CS/Onc/Rad Onc for anal cancer. Finished CTX and XRT 01-2017.   11/28/22 id clinic f/u Patient said he missed 2 days of medication symtuza and will pick up his refill I confirm again as 10/2022 labs showed viral load increasing to very high. I suspect he is not taking  He said he hasn't tried the imiquimod yet because the price was high; [facility] was able to get it down to 16 dollar per rx and he'll pick it up later Liver u/s obtained for chronic hep b showed no evidence of hepatoma Hasn't seen dental people since 09/2022 - still have lots of loose teeth; no dental pain  Not sexually the past month; no sx; refuse std testing today  10/16/22 id clinic f/u Missed 1 dose last 4 weeks of symtuza and tivicay Complains of at least months of irritating "scars" around anal area No bleeding there No other concern today  07/30/22 id clinic f/u He appears to be taking symtuza and tivicay for the past year He has been well controlled up to early 2021 but "life" happened. He reports to be in good situation now and able to take medication regularly Missed 2-3 doses the last 4 weeks.   Hx anal cancer documented 2018 Per patient treated by [facility]/onc/rad-onc 01/2021 dr Algis Downs note --> chemo and radiation 01/2017 No recurrence as of 2021  Hx ckd3 Cr around 1.1-1.2 in 2021-2022  Social: Lives alone in [place] Few cousins and a brother in [place] Own a car and drives Not in a relationship at this time; "not really" sexually active Not working  On disability Smoke some kind of cigarettes; no other street drugs; etoh on weekend  No other  complaint He has a dental appointment coming up at [facility]. Has dental carries and some pain; no fever/headache   HIV 1 RNA Quant  Date Value  10/16/2022 55,000 Copies/mL (H)  08/08/2022 1,940 Copies/mL (H)  01/12/2021 30,700 copies/mL (H)   CD4 (no units)  Date Value  12/11/2016 See Separate Report   CD4 T Cell Abs (/uL)  Date Value  08/08/2022 138 (L)  08/08/2020 125 (L)  03/27/2020 114 (L)     Health Maintenance  Topic Date Due   Zoster Vaccines- Shingrix (1 of 2) Never done   Medicare Annual Wellness (AWV)  01/07/2017   COLONOSCOPY (Pts 45-13yr Insurance coverage will need to be confirmed)  Never done   COVID-19 Vaccine (3 - Pfizer risk series) 03/12/2021   INFLUENZA VACCINE  07/02/2022   DTaP/Tdap/Td (4 - Td or Tdap) 07/14/2031   Hepatitis C Screening  Completed   HIV Screening  Completed   HPV VACCINES  Aged Out    ROS: All other ros negative    Objective:   Vitals:   11/28/22 1124  BP: 120/71  Pulse: 66  Temp: 97.6 F (36.4 C)  SpO2: 99%     General/constitutional: no distress, pleasant HEENT: Normocephalic, PER, Conj Clear, EOMI; several loose teeth Neck supple CV: rrr no mrg Lungs: clear to auscultation, normal respiratory effort Abd: Soft, Nontender Ext: no edema Neuro: nonfocal MSK: no peripheral  joint swelling/tenderness/warmth; back spines nontender   Skin: multiple small (<0.5 cm) veruceous like papules around anal area nontender and no bleeding and non-ulcerating              Assessment & Plan:   #hiv On symtuza and tivicay for concern hx off and on meds [facility] almost a month ago  Doesn't want labs today but will drop by within the next few weeks Will see if viral load is normal today on tivicay/symtuza -- appear to be very compliant  12/28 discuss with him viral load rising 10/2022. He reports taking meds. Asked him to make sure he is actually taking symtuza and tivicay each day  -labs  today with genotype testing -discussed u=u -encourage compliance -continue current HIV medication symtuza/tivicay -labs today -f/u in 6 weeks   #chronic hepatitis B; eag positive 08/2022 viral load 217,000,000; 10/16/22 repeat viral load 100^7; not taking biktarvy liver u/s 12/19 no hepatoma  -advise importance of taking hiv meds; we have even fewer options to suppress hep b virus than hiv    #hx anal cancer #perianal wart Treated 2018 by xrt/cxt No evidence disease of anal cancer as of 2021 f/u with colorectal surgery Today 10/16/22 perianal wart. Discussed they could go away or get bigger. Due to sx of irritation patient wants to treat  -he is to pick up imiquimod and try. Discussed it could take several weeks to see improvement   #ckd Baseline cr 1.1-1.2   #poor dentition  -advise risk of heart infection -- he'll call and get his loose teeths removed   #hcm -vaccinations Will review -hepatitis Chronic hep b see above Hep c ab negative 08/2022 -std 08/2022 rpr negative Refuse triple screen today 11/28/22; didn't get it done previous visit  -cancer screening Will discuss (colorectal and anal cancer) -pcp establishment Discuss he needs to arrange to get a primary care provider   I have spent a total of 40 minutes of face-to-face and non-face-to-face time, excluding clinical staff time, preparing to see patient, ordering tests and/or medications, and provide counseling the patient

## 2022-11-28 NOTE — Patient Instructions (Signed)
Make sure you take your symtuza and tivicay every day -- otherwise the hepatitis b and hiv will become resistant  Labs today and please see me in 6 weeks   Also call your dental provider to remove the loose teeth, as leaving them there put you at risk for heart valve infection

## 2022-12-06 ENCOUNTER — Other Ambulatory Visit (HOSPITAL_COMMUNITY): Payer: Self-pay

## 2022-12-09 LAB — HIV-1 RNA QUANT-NO REFLEX-BLD
HIV 1 RNA Quant: 3000 Copies/mL — ABNORMAL HIGH
HIV-1 RNA Quant, Log: 3.48 Log cps/mL — ABNORMAL HIGH

## 2022-12-09 LAB — HIV RNA, RTPCR W/R GT (RTI, PI,INT)
HIV 1 RNA Quant: 4910 copies/mL — ABNORMAL HIGH
HIV-1 RNA Quant, Log: 3.69 Log copies/mL — ABNORMAL HIGH

## 2022-12-09 LAB — HIV-1 GENOTYPE: HIV-1 Genotype: DETECTED — AB

## 2022-12-09 LAB — HIV-1 INTEGRASE GENOTYPE

## 2022-12-19 ENCOUNTER — Other Ambulatory Visit (HOSPITAL_COMMUNITY): Payer: Self-pay

## 2022-12-23 ENCOUNTER — Other Ambulatory Visit (HOSPITAL_COMMUNITY): Payer: Self-pay

## 2022-12-25 ENCOUNTER — Other Ambulatory Visit (HOSPITAL_COMMUNITY): Payer: Self-pay

## 2022-12-27 ENCOUNTER — Other Ambulatory Visit: Payer: Self-pay

## 2022-12-27 ENCOUNTER — Other Ambulatory Visit (HOSPITAL_COMMUNITY): Payer: Self-pay

## 2023-01-07 ENCOUNTER — Other Ambulatory Visit (HOSPITAL_COMMUNITY): Payer: Self-pay

## 2023-01-16 ENCOUNTER — Other Ambulatory Visit (HOSPITAL_COMMUNITY): Payer: Self-pay

## 2023-01-16 ENCOUNTER — Other Ambulatory Visit: Payer: Self-pay

## 2023-01-16 ENCOUNTER — Encounter: Payer: Self-pay | Admitting: Internal Medicine

## 2023-01-16 ENCOUNTER — Ambulatory Visit (INDEPENDENT_AMBULATORY_CARE_PROVIDER_SITE_OTHER): Payer: Medicare Other | Admitting: Internal Medicine

## 2023-01-16 VITALS — BP 117/70 | HR 67 | Temp 98.1°F | Wt 137.0 lb

## 2023-01-16 DIAGNOSIS — B181 Chronic viral hepatitis B without delta-agent: Secondary | ICD-10-CM

## 2023-01-16 DIAGNOSIS — Z113 Encounter for screening for infections with a predominantly sexual mode of transmission: Secondary | ICD-10-CM

## 2023-01-16 DIAGNOSIS — B20 Human immunodeficiency virus [HIV] disease: Secondary | ICD-10-CM

## 2023-01-16 NOTE — Patient Instructions (Signed)
Lab today   F/u 4-6 weeks   Make sure you do not miss your medication as since you have hepatitis b as well it can become resistant and there is not many other option for it

## 2023-01-16 NOTE — Progress Notes (Signed)
Subjective:    Patient ID: Juan Harrison, male  DOB: Aug 18, 1971, 52 y.o.        MRN: [id number]  Cc - f/u hiv  HPI 51 yo M with hx of HIV/AIDS. Dx ~2007; MSM. ART hx- Darunavir-norvir/truvada/Epzicom bactrim (Hx of non-compliance).  He was changed to genvoya 05-2016. Switched to Symtuza/descovey 04-2020.  History of anal cancer and has been seen by [facility]/Onc/Rad Onc for anal cancer. Finished CTX and XRT 01-2017.    01/16/23 id clinic f/u He said he has been taking tivicay and symtuza every day. Missed yesterday Need to pick up meds today No f/c No rash No concern in his health  Social -- last sexual encounter 11/2022.  11/28/22 id clinic f/u Patient said he missed 2 days of medication symtuza and will pick up his refill I confirm again as 10/2022 labs showed viral load increasing to very high. I suspect he is not taking  He said he hasn't tried the imiquimod yet because the price was high; [facility] was able to get it down to 16 dollar per rx and he'll pick it up later Liver u/s obtained for chronic hep b showed no evidence of hepatoma Hasn't seen dental people since 09/2022 - still have lots of loose teeth; no dental pain  Not sexually the past month; no sx; refuse std testing today  10/16/22 id clinic f/u Missed 1 dose last 4 weeks of symtuza and tivicay Complains of at least months of irritating "scars" around anal area No bleeding there No other concern today  07/30/22 id clinic f/u He appears to be taking symtuza and tivicay for the past year He has been well controlled up to early 2021 but "life" happened. He reports to be in good situation now and able to take medication regularly Missed 2-3 doses the last 4 weeks.   Hx anal cancer documented 2018 Per patient treated by [facility]/onc/rad-onc 01/2021 dr Algis Downs note --> chemo and radiation 01/2017 No recurrence as of 2021  Hx ckd3 Cr around 1.1-1.2 in 2021-2022  Social: Lives alone in [place] Few cousins and a  brother in [place] Own a car and drives Not in a relationship at this time; "not really" sexually active Not working  On disability Smoke some kind of cigarettes; no other street drugs; etoh on weekend  No other complaint He has [facility]. Has dental carries and some pain; no fever/headache   HIV 1 RNA Quant  Date Value  11/28/2022 3,000 Copies/mL (H)  11/28/2022 4,910 copies/mL (H)  10/16/2022 55,000 Copies/mL (H)   CD4 (no units)  Date Value  12/11/2016 See Separate Report   CD4 T Cell Abs (/uL)  Date Value  08/08/2022 138 (L)  08/08/2020 125 (L)  03/27/2020 114 (L)     Health Maintenance  Topic Date Due   Zoster Vaccines- Shingrix (1 of 2) Never done   Medicare Annual Wellness (AWV)  01/07/2017   COLONOSCOPY (Pts 45-31yr Insurance coverage will need to be confirmed)  Never done   COVID-19 Vaccine (3 - Pfizer risk series) 03/12/2021   INFLUENZA VACCINE  07/02/2022   DTaP/Tdap/Td (4 - Td or Tdap) 07/14/2031   Hepatitis C Screening  Completed   HIV Screening  Completed   HPV VACCINES  Aged Out    ROS: All other ros negative    Objective:   Vitals:   01/16/23 1132  BP: 117/70  Pulse: 67  Temp: 98.1 F (36.7 C)  SpO2: 100%  General/constitutional: no distress, pleasant HEENT: Normocephalic, PER, Conj Clear, EOMI, Oropharynx clear -- poor dentition Neck supple CV: rrr no mrg Lungs: clear to auscultation, normal respiratory effort Abd: Soft, Nontender Ext: no edema Skin: No Rash Neuro: nonfocal MSK: no peripheral joint swelling/tenderness/warmth; back spines nontender  Gu- not checked today              Assessment & Plan:   #hiv On symtuza and tivicay for concern hx off and on meds  12/28 discuss with him viral load rising 10/2022. He reports taking meds. Asked him to make sure he is actually taking symtuza and tivicay each day  01/16/23 December labs showed improved but still not controlled  viral load. He reports good compliance missing one dose last 4 weeks of symtuza/tivicay. Will repeat lab today and if viral load still >200 will check resistance testing. Discussed with him coinfection with hep b that he really need to be on meds to prevent getting resistance to hepatitis b as well     -discussed u=u -encourage compliance -continue current HIV medication -labs today -f/u in 4-6 weeks    #chronic hepatitis B; eag positive 08/2022 viral load 217,000,000; 10/16/22 repeat viral load 100^7; not taking biktarvy liver u/s 12/19 no hepatoma  -advise importance of taking hiv meds; we have even fewer options to suppress hep b virus than hiv -repeat full hepatitis b panel today   Didn't dicuss below ----------- #hx anal cancer #perianal wart Treated 2018 by xrt/cxt No evidence disease of anal cancer as of 2021 f/u with colorectal surgery Today 10/16/22 perianal wart. Discussed they could go away or get bigger. Due to sx of irritation patient wants to treat  -he is to pick up imiquimod and try. Discussed it could take several weeks to see improvement   #ckd Baseline cr 1.1-1.2  --------------- #poor dentition -advise risk of heart infection -- discuss [facility] -- referral placed 01/16/23   #hcm -vaccinations Will review -hepatitis Chronic hep b see above Hep c ab negative 08/2022 -std 08/2022 rpr negative -- repeat today Refuse triple screen again today -cancer screening Will discuss (colorectal and anal cancer) -[facility]p establishment Discuss he needs to arrange to get a primary care provider  I have spent a total of 30 minutes of face-to-face and non-face-to-face time, excluding clinical staff time, preparing to see patient, ordering tests and/or medications, and provide counseling the patient

## 2023-01-20 ENCOUNTER — Other Ambulatory Visit (HOSPITAL_COMMUNITY): Payer: Self-pay

## 2023-01-20 LAB — HEPATITIS B E ANTIBODY: Hep B E Ab: NONREACTIVE

## 2023-01-20 LAB — HEPATITIS B SURFACE ANTIBODY, QUANTITATIVE: Hep B S AB Quant (Post): <5 m[IU]/mL — ABNORMAL LOW (ref >=10)

## 2023-01-20 LAB — HEPATITIS B DNA, ULTRAQUANTITATIVE, PCR
Hepatitis B DNA: 159000 IU/mL — ABNORMAL HIGH
Hepatitis B virus DNA: 5.2 Log IU/mL — ABNORMAL HIGH

## 2023-01-20 LAB — HEPATITIS B E ANTIGEN: Hep B E Ag: REACTIVE — AB

## 2023-01-20 LAB — HIV-1 RNA QUANT-NO REFLEX-BLD
HIV 1 RNA Quant: 27 Copies/mL — ABNORMAL HIGH
HIV-1 RNA Quant, Log: 1.43 Log cps/mL — ABNORMAL HIGH

## 2023-01-20 LAB — HEPATITIS B SURFACE ANTIGEN: Hepatitis B Surface Ag: REACTIVE — AB

## 2023-01-20 LAB — RPR: RPR Ser Ql: NONREACTIVE

## 2023-02-06 ENCOUNTER — Other Ambulatory Visit (HOSPITAL_COMMUNITY): Payer: Self-pay

## 2023-02-10 ENCOUNTER — Other Ambulatory Visit (HOSPITAL_COMMUNITY): Payer: Self-pay

## 2023-02-11 ENCOUNTER — Ambulatory Visit: Payer: Medicare Other | Admitting: Internal Medicine

## 2023-02-12 ENCOUNTER — Other Ambulatory Visit (HOSPITAL_COMMUNITY): Payer: Self-pay

## 2023-02-13 ENCOUNTER — Other Ambulatory Visit (HOSPITAL_COMMUNITY): Payer: Self-pay

## 2023-02-25 ENCOUNTER — Other Ambulatory Visit (HOSPITAL_COMMUNITY): Payer: Self-pay

## 2023-03-11 ENCOUNTER — Other Ambulatory Visit (HOSPITAL_COMMUNITY): Payer: Self-pay

## 2023-03-27 ENCOUNTER — Other Ambulatory Visit (HOSPITAL_COMMUNITY): Payer: Self-pay

## 2023-04-03 ENCOUNTER — Other Ambulatory Visit (HOSPITAL_COMMUNITY): Payer: Self-pay

## 2023-04-07 ENCOUNTER — Other Ambulatory Visit (HOSPITAL_COMMUNITY): Payer: Self-pay

## 2023-04-09 ENCOUNTER — Other Ambulatory Visit (HOSPITAL_COMMUNITY): Payer: Self-pay

## 2023-04-18 ENCOUNTER — Other Ambulatory Visit (HOSPITAL_COMMUNITY): Payer: Self-pay

## 2023-04-18 ENCOUNTER — Other Ambulatory Visit: Payer: Self-pay

## 2023-05-01 ENCOUNTER — Other Ambulatory Visit: Payer: Self-pay

## 2023-05-06 ENCOUNTER — Other Ambulatory Visit (HOSPITAL_COMMUNITY): Payer: Self-pay

## 2023-05-15 ENCOUNTER — Encounter: Payer: Self-pay | Admitting: Internal Medicine

## 2023-05-15 ENCOUNTER — Other Ambulatory Visit (HOSPITAL_COMMUNITY): Payer: Self-pay

## 2023-05-15 ENCOUNTER — Other Ambulatory Visit (HOSPITAL_COMMUNITY)
Admission: RE | Admit: 2023-05-15 | Discharge: 2023-05-15 | Disposition: A | Discharge status: Home / Self Care | Payer: Medicare Other | Source: Ambulatory Visit | Attending: Internal Medicine | Admitting: Internal Medicine

## 2023-05-15 ENCOUNTER — Ambulatory Visit (INDEPENDENT_AMBULATORY_CARE_PROVIDER_SITE_OTHER): Payer: Medicare Other | Admitting: Internal Medicine

## 2023-05-15 ENCOUNTER — Other Ambulatory Visit: Payer: Self-pay

## 2023-05-15 VITALS — BP 100/61 | HR 69 | Resp 16 | Ht 75.0 in | Wt 141.0 lb

## 2023-05-15 DIAGNOSIS — Z113 Encounter for screening for infections with a predominantly sexual mode of transmission: Secondary | ICD-10-CM | POA: Diagnosis not present

## 2023-05-15 DIAGNOSIS — Z129 Encounter for screening for malignant neoplasm, site unspecified: Secondary | ICD-10-CM

## 2023-05-15 DIAGNOSIS — B20 Human immunodeficiency virus [HIV] disease: Secondary | ICD-10-CM | POA: Insufficient documentation

## 2023-05-15 DIAGNOSIS — Z1151 Encounter for screening for human papillomavirus (HPV): Secondary | ICD-10-CM | POA: Diagnosis not present

## 2023-05-15 DIAGNOSIS — B181 Chronic viral hepatitis B without delta-agent: Secondary | ICD-10-CM

## 2023-05-15 LAB — CBC
HCT: 39.9 % (ref 38.5–50.0)
MCH: 29.6 pg (ref 27.0–33.0)
MCHC: 33.1 g/dL (ref 32.0–36.0)
MPV: 11.1 fL (ref 7.5–12.5)
Platelets: 209 10*3/uL (ref 140–400)

## 2023-05-15 NOTE — Progress Notes (Signed)
Subjective:    Patient ID: Juan Harrison, male  DOB: 1971-03-09, 52 y.o.        MRN: [id number]  Cc - f/u hiv  HPI 52 yo M with hx of HIV/AIDS. Dx ~2007; MSM. ART hx- Darunavir-norvir/truvada/Epzicom bactrim (Hx of non-compliance).  He was changed to genvoya 05-2016. Switched to Symtuza/descovey 04-2020.  History of anal cancer and has been seen by [facility]/Onc/Rad Onc for anal cancer. Finished CTX and XRT 01-2017.   05/15/23 id clinic f/u Social -- no new sexual partner/encounter since last visit with me He tried to take hiv meds every day and said no missed dose last 4 weeks Tried imiquimod off and on- perianal wart still there No other concern Reviewed hepatitis b labs and concerning for not controlled/resistance   01/16/23 id clinic f/u He said he has been taking tivicay and symtuza every day. Missed yesterday Need to pick up meds today No f/c No rash No concern in his health  Social -- last sexual encounter 11/2022.  11/28/22 id clinic f/u Patient said he missed 2 days of medication symtuza and will pick up his refill I confirm again as 10/2022 labs showed viral load increasing to very high. I suspect he is not taking  He said he hasn't tried the imiquimod yet because the price was high; [facility] was able to get it down to 16 dollar per rx and he'll pick it up later Liver u/s obtained for chronic hep b showed no evidence of hepatoma Hasn't seen dental people since 09/2022 - still have lots of loose teeth; no dental pain  Not sexually the past month; no sx; refuse std testing today  10/16/22 id clinic f/u Missed 1 dose last 4 weeks of symtuza and tivicay Complains of at least months of irritating "scars" around anal area No bleeding there No other concern today  07/30/22 id clinic f/u He appears to be taking symtuza and tivicay for the past year He has been well controlled up to early 2021 but "life" happened. He reports to be in good situation now and able to take  medication regularly Missed 2-3 doses the last 4 weeks.   Hx anal cancer documented 2018 Per patient treated by [facility]/onc/rad-onc 01/2021 dr Moshe Cipro note --> chemo and radiation 01/2017 No recurrence as of 2021  Hx ckd3 Cr around 1.1-1.2 in 2021-2022  Social: Lives alone in [place] Few cousins and a brother in [place] Own a car and drives Not in a relationship at this time; "not really" sexually active Not working  On disability Smoke some kind of cigarettes; no other street drugs; etoh on weekend  No other complaint He has a [facility] appointment [facility]. Has dental carries and some pain; no fever/headache   HIV 1 RNA Quant  Date Value  01/16/2023 27 Copies/mL (H)  11/28/2022 3,000 Copies/mL (H)  11/28/2022 4,910 copies/mL (H)   CD4 (no units)  Date Value  12/11/2016 See Separate Report   CD4 T Cell Abs (/uL)  Date Value  08/08/2022 138 (L)  08/08/2020 125 (L)  03/27/2020 114 (L)     Health Maintenance  Topic Date Due   Zoster Vaccines- Shingrix (1 of 2) Never done   Medicare Annual Wellness (AWV)  01/07/2017   Colonoscopy  Never done   COVID-19 Vaccine (3 - Pfizer risk series) 03/12/2021   INFLUENZA VACCINE  07/03/2023   DTaP/Tdap/Td (4 - Td or Tdap) 07/14/2031   Hepatitis C Screening  Completed   HIV  Screening  Completed   HPV VACCINES  Aged Out    ROS: All other ros negative    Objective:   Vitals:   05/15/23 1033  BP: 100/61  Pulse: 69  Resp: 16  SpO2: 99%     General/constitutional: no distress, pleasant HEENT: Normocephalic, PER, Conj Clear, EOMI, Oropharynx clear -- poor dentition Neck supple CV: rrr no mrg Lungs: clear to auscultation, normal respiratory effort Abd: Soft, Nontender Ext: no edema Skin: No Rash Neuro: nonfocal MSK: no peripheral joint swelling/tenderness/warmth; back spines nontender  Gu- not checked today   Labs: Lab Results  Component Value Date   WBC 3.3 (L) 10/16/2022   HGB 13.4  10/16/2022   HCT 39.8 10/16/2022   MCV 87.1 10/16/2022   PLT 185 10/16/2022   Last metabolic panel Lab Results  Component Value Date   GLUCOSE 96 10/16/2022   NA 141 10/16/2022   K 3.6 10/16/2022   CL 109 10/16/2022   CO2 31 10/16/2022   BUN 11 10/16/2022   CREATININE 0.97 10/16/2022   EGFR 95 10/16/2022   CALCIUM 8.0 (L) 10/16/2022   PHOS 2.5 12/02/2018   PROT 6.4 10/16/2022   ALBUMIN 3.7 04/01/2020   BILITOT 0.4 10/16/2022   ALKPHOS 80 04/01/2020   AST 17 10/16/2022   ALT 13 10/16/2022   ANIONGAP 14 04/01/2020   HIV: Lab Results  Component Value Date   HIV1RNAQUANT 27 (H) 01/16/2023   Lab Results  Component Value Date   CD4TCELL 9 (L) 08/08/2022   CD4TABS 138 (L) 08/08/2022      Imaging: Reviewed  11/2022 liver u/s 1. Increased liver echogenicity is consistent with hepatic steatosis. 2. Small benign-appearing cysts in the liver.        Assessment & Plan:   #hiv On symtuza and tivicay for concern hx off and on meds  12/28 discuss with him viral load rising 10/2022. He reports taking meds. Asked him to make sure he is actually taking symtuza and tivicay each day  01/16/23 December labs showed improved but still not controlled viral load. He reports good compliance missing one dose last 4 weeks of symtuza/tivicay. Will repeat lab today and if viral load still >200 will check resistance testing. Discussed with him coinfection with hep b that he really need to be on meds to prevent getting resistance to hepatitis b as well   Told me today 05/15/23 no missed dose last 4 weeks   -discussed u=u -encourage compliance -continue current HIV medication -labs today -f/u in 4-6 weeks    #chronic hepatitis B; eag positive 08/2022 viral load 217,000,000; 10/16/22 repeat viral load 100^7; not taking biktarvy. Persistently high viral load and 01/16/23 159k -- appears to be taking ART at that time liver u/s 11/2022 no hepatoma   -repeat liver ultrasound for  hepatoma screen -will send hep b resistance profile    #hx anal cancer #perianal wart Treated 2018 by xrt/cxt No evidence disease of anal cancer as of 2021 f/u with colorectal surgery Today 10/16/22 perianal wart. Discussed they could go away or get bigger.  Had given imiquimod -- picked up in 01/2023 and used every now and then; still there  -advise getting primary care provider to see if liquid nitrogen cryotherapy would work for the wart -continue imiquimod but use more regularly as directed -will repeat anal-pap smear today; if positive will need to get him back to colorectal surgery who patinet haven't seen since 2021    #ckd Baseline cr 1.1-1.2  -repeat today   #  poor dentition -advise risk of heart infection -- discuss we have [facility] care [facility]   - referral placed 01/16/23; advised patient to call our dental clinic   ------------------ #hcm -vaccinations Will review -hepatitis Chronic hep b see above Hep c ab negative 08/2022 -std 08/2022 rpr negative -- repeat today Refuse triple screen again today -cancer screening Will discuss (colorectal and anal cancer) -pcp establishment Discuss he needs to arrange to get a primary care provider  I have spent a total of 30 minutes of face-to-face and non-face-to-face time, excluding clinical staff time, preparing to see patient, ordering tests and/or medications, and provide counseling the patient

## 2023-05-15 NOTE — Addendum Note (Signed)
Addended by: Harley Alto on: 05/15/2023 11:30 AM   Modules accepted: Orders

## 2023-05-15 NOTE — Addendum Note (Signed)
Addended by: Harley Alto on: 05/15/2023 02:33 PM   Modules accepted: Orders

## 2023-05-15 NOTE — Patient Instructions (Addendum)
Your hepatitis b doesn't appear to be well controlled -we'll be checking for resistance virus -make sure you take your symtuza and tivicay every day -- this will treat the hep b  and avoid resistance development if you don't take regularly    Will check anal cancer screening along with std labs and hiv labs today    See me in around 5 weeks to follow up on hepatitis b

## 2023-05-16 LAB — COMPLETE METABOLIC PANEL WITH GFR
AG Ratio: 1.1 (calc) (ref 1.0–2.5)
ALT: 20 U/L (ref 9–46)
Albumin: 3.6 g/dL (ref 3.6–5.1)
BUN: 12 mg/dL (ref 7–25)
Calcium: 8.7 mg/dL (ref 8.6–10.3)
Glucose, Bld: 76 mg/dL (ref 65–99)
Sodium: 139 mmol/L (ref 135–146)
Total Bilirubin: 0.5 mg/dL (ref 0.2–1.2)
eGFR: 82 mL/min/{1.73_m2} (ref >=60)

## 2023-05-16 LAB — RPR: RPR Ser Ql: NONREACTIVE

## 2023-05-16 LAB — T-HELPER CELLS (CD4) COUNT (NOT AT ARMC)
Absolute CD4: 175 cells/uL — ABNORMAL LOW (ref 490–1740)
CD4 T Helper %: 13 % — ABNORMAL LOW (ref 30–61)
Total lymphocyte count: 1331 cells/uL (ref 850–3900)

## 2023-05-16 LAB — HEPATITIS B E ANTIGEN: Hep B E Ag: REACTIVE — AB

## 2023-05-16 LAB — HEPATITIS B SURFACE ANTIGEN: Hepatitis B Surface Ag: REACTIVE — AB

## 2023-05-16 LAB — HEPATITIS C ANTIBODY: Hepatitis C Ab: NONREACTIVE

## 2023-05-17 LAB — HEPATITIS B DNA, ULTRAQUANTITATIVE, PCR: Hepatitis B virus DNA: 7.39 Log IU/mL — ABNORMAL HIGH

## 2023-05-19 ENCOUNTER — Telehealth: Payer: Self-pay

## 2023-05-19 ENCOUNTER — Other Ambulatory Visit (HOSPITAL_COMMUNITY): Payer: Self-pay

## 2023-05-19 NOTE — Telephone Encounter (Signed)
Noted. Thanks.

## 2023-05-19 NOTE — Telephone Encounter (Signed)
-----   Message from Raymondo Band, MD sent at 05/18/2023  4:59 PM EDT ----- This guy probably not taking medication regularly. Viral load hiv rising. Hep b dna way too high   Pending hep b resistance testing

## 2023-05-19 NOTE — Telephone Encounter (Signed)
I spoke to the patient and relayed lab results.  Patient reports only missing 5 doses of his medication in the past month. I advised the patient we will wait for the rest of his labs to come back and follow up with him Juan Harrison T [name]

## 2023-05-21 LAB — HEPATITIS B VIRUS CORE/PRECORE
BCP Mutations: NOT DETECTED
HBV PreCore Mutation: NOT DETECTED
Polymerase Mutations: NOT DETECTED

## 2023-05-21 LAB — COMPLETE METABOLIC PANEL WITH GFR
AST: 18 U/L (ref 10–35)
Alkaline phosphatase (APISO): 68 U/L (ref 35–144)
CO2: 27 mmol/L (ref 20–32)
Chloride: 108 mmol/L (ref 98–110)
Creat: 1.1 mg/dL (ref 0.70–1.30)
Globulin: 3.4 g/dL (calc) (ref 1.9–3.7)
Potassium: 4.3 mmol/L (ref 3.5–5.3)
Total Protein: 7 g/dL (ref 6.1–8.1)

## 2023-05-21 LAB — CBC
Hemoglobin: 13.2 g/dL (ref 13.2–17.1)
MCV: 89.5 fL (ref 80.0–100.0)
RBC: 4.46 10*6/uL (ref 4.20–5.80)
RDW: 12.8 % (ref 11.0–15.0)
WBC: 3.7 10*3/uL — ABNORMAL LOW (ref 3.8–10.8)

## 2023-05-21 LAB — CYTOLOGY - PAP
Comment: NEGATIVE
Diagnosis: UNDETERMINED — AB
High risk HPV: NEGATIVE

## 2023-05-21 LAB — HIV-1 RNA QUANT-NO REFLEX-BLD
HIV 1 RNA Quant: 140 Copies/mL — ABNORMAL HIGH
HIV-1 RNA Quant, Log: 2.15 Log cps/mL — ABNORMAL HIGH

## 2023-05-21 LAB — HEPATITIS B DNA, ULTRAQUANTITATIVE, PCR: Hepatitis B DNA: 24300000 IU/mL — ABNORMAL HIGH

## 2023-05-21 LAB — HEPATITIS B E ANTIBODY: Hep B E Ab: NONREACTIVE

## 2023-05-23 ENCOUNTER — Other Ambulatory Visit (HOSPITAL_COMMUNITY): Payer: Self-pay

## 2023-05-26 ENCOUNTER — Other Ambulatory Visit (HOSPITAL_COMMUNITY): Payer: Self-pay

## 2023-05-26 ENCOUNTER — Encounter (HOSPITAL_COMMUNITY): Payer: Self-pay

## 2023-06-19 ENCOUNTER — Other Ambulatory Visit: Payer: Self-pay

## 2023-06-19 ENCOUNTER — Encounter: Payer: Self-pay | Admitting: Internal Medicine

## 2023-06-19 ENCOUNTER — Ambulatory Visit (INDEPENDENT_AMBULATORY_CARE_PROVIDER_SITE_OTHER): Payer: Self-pay | Admitting: Internal Medicine

## 2023-06-19 VITALS — BP 95/60 | HR 81 | Resp 16 | Ht 75.0 in | Wt 134.6 lb

## 2023-06-19 DIAGNOSIS — B20 Human immunodeficiency virus [HIV] disease: Secondary | ICD-10-CM

## 2023-07-08 ENCOUNTER — Other Ambulatory Visit (HOSPITAL_COMMUNITY): Payer: Self-pay

## 2023-07-09 ENCOUNTER — Other Ambulatory Visit (HOSPITAL_COMMUNITY): Payer: Self-pay

## 2023-07-28 ENCOUNTER — Other Ambulatory Visit: Payer: Self-pay

## 2023-07-30 ENCOUNTER — Other Ambulatory Visit: Payer: Self-pay

## 2023-08-01 ENCOUNTER — Other Ambulatory Visit: Payer: Self-pay

## 2023-08-05 ENCOUNTER — Other Ambulatory Visit (HOSPITAL_COMMUNITY): Payer: Self-pay

## 2023-09-11 ENCOUNTER — Other Ambulatory Visit: Payer: Self-pay | Admitting: Pharmacist

## 2023-09-22 ENCOUNTER — Telehealth: Payer: Self-pay

## 2023-09-22 NOTE — Telephone Encounter (Signed)
Called patient to schedule follow up appointment, no answer and no voicemail box set up.   Sandie Ano, RN

## 2023-11-04 ENCOUNTER — Other Ambulatory Visit (HOSPITAL_COMMUNITY): Payer: Self-pay

## 2023-11-10 ENCOUNTER — Other Ambulatory Visit: Payer: Self-pay | Admitting: Internal Medicine

## 2023-11-10 DIAGNOSIS — B20 Human immunodeficiency virus [HIV] disease: Secondary | ICD-10-CM

## 2023-11-10 MED ORDER — SYMTUZA 800-150-200-10 MG PO TABS
1.0000 | ORAL_TABLET | Freq: Every day | ORAL | 1 refills | Status: DC
Start: 2023-11-10 — End: 2024-01-01
  Filled 2023-11-12: qty 30, 30d supply, fill #0
  Filled 2023-11-27: qty 30, 30d supply, fill #1

## 2023-11-10 MED ORDER — TIVICAY 50 MG PO TABS
ORAL_TABLET | Freq: Every day | ORAL | 1 refills | Status: DC
Start: 2023-11-10 — End: 2024-01-01
  Filled 2023-11-12: qty 30, 30d supply, fill #0
  Filled 2023-11-27: qty 30, 30d supply, fill #1

## 2023-11-12 ENCOUNTER — Other Ambulatory Visit: Payer: Self-pay | Admitting: Pharmacist

## 2023-11-12 ENCOUNTER — Other Ambulatory Visit: Payer: Self-pay

## 2023-11-12 ENCOUNTER — Other Ambulatory Visit (HOSPITAL_COMMUNITY): Payer: Self-pay

## 2023-11-12 NOTE — Progress Notes (Deleted)
Specialty Pharmacy Initial Fill Coordination Note  Juan Harrison is a 52 y.o. male contacted today regarding initial fill of specialty medication(s) Darun-Cobic-Emtricit-Tenofaf; Dolutegravir Sodium   Patient requested [name] at [facility] at [place] date: 11/13/23   Medication will be filled on 11/12/23.   Patient is aware of $0 copayment.

## 2023-11-12 NOTE — Progress Notes (Signed)
Specialty Pharmacy Initiation Note   Juan Harrison is a 52 y.o. male who will be followed by the specialty pharmacy service for RxSp HIV    Review of administration, indication, effectiveness, safety, potential side effects, storage/disposable, and missed dose instructions occurred today for patient's specialty medication(s) Darun-Cobic-Emtricit-Tenofaf; Dolutegravir Sodium     Patient/Caregiver did not have any additional questions or concerns.   Patient's therapy is appropriate to: Continue    Goals Addressed             This Visit's Progress    Achieve Undetectable HIV Viral Load < 20       Patient is not on track and no change. Patient will work on increased adherence      Comply with lab assessments       Patient is  not on track . Patient will be evaluated at upcoming provider appointment to assess progress      Increase CD4 count until steady state       Patient is on track. Patient will work on increased adherence      Maintain optimal adherence to therapy       Patient is not on track and improving. Patient will work on increased adherence        Patient states he wasn't filling his medications because his copay was $12. Toni Amend confirmed that both have $0 copay.   Jennette Kettle Specialty Pharmacist

## 2023-11-12 NOTE — Progress Notes (Signed)
Specialty Pharmacy Initial Fill Coordination Note  Juan Harrison is a 52 y.o. male contacted today regarding initial fill of specialty medication(s) Dolutegravir Sodium; Darun-Cobic-Emtricit-Tenofaf   Patient requested Juan Harrison at [facility] at [place] date: 11/12/23   Medication will be filled on 11/12/23.   Patient is aware of $0 copayment.

## 2023-11-13 ENCOUNTER — Other Ambulatory Visit: Payer: Self-pay

## 2023-11-13 ENCOUNTER — Ambulatory Visit (INDEPENDENT_AMBULATORY_CARE_PROVIDER_SITE_OTHER): Payer: Medicare Other | Admitting: Internal Medicine

## 2023-11-13 VITALS — BP 116/70 | HR 68 | Temp 98.0°F | Ht 75.0 in | Wt 142.0 lb

## 2023-11-13 DIAGNOSIS — B20 Human immunodeficiency virus [HIV] disease: Secondary | ICD-10-CM

## 2023-11-13 DIAGNOSIS — Z113 Encounter for screening for infections with a predominantly sexual mode of transmission: Secondary | ICD-10-CM

## 2023-11-13 DIAGNOSIS — Z91199 Patient's noncompliance with other medical treatment and regimen due to unspecified reason: Secondary | ICD-10-CM

## 2023-11-13 DIAGNOSIS — B181 Chronic viral hepatitis B without delta-agent: Secondary | ICD-10-CM

## 2023-11-13 NOTE — Progress Notes (Signed)
Subjective:    Patient ID: Juan Harrison, male  DOB: 1971-05-19, 52 y.o.        MRN: [id number]  Cc - f/u hiv  HPI 51 yo M with hx of HIV/AIDS. Dx ~2007; MSM. ART hx- Darunavir-norvir/truvada/Epzicom bactrim (Hx of non-compliance).  He was changed to genvoya 05-2016. Switched to Symtuza/descovey 04-2020.  History of anal cancer and has been seen by [facility]/Onc/Rad Onc for anal cancer. Finished CTX and XRT 01-2017.   11/13/2023 id clinic f/u See below   05/15/23 id clinic f/u Social -- no new sexual partner/encounter since last visit with me He tried to take hiv meds every day and said no missed dose last 4 weeks Tried imiquimod off and on- perianal wart still there No other concern Reviewed hepatitis b labs and concerning for not controlled/resistance   01/16/23 id clinic f/u He said he has been taking tivicay and symtuza every day. Missed yesterday Need to pick up meds today No f/c No rash No concern in his health  Social -- last sexual encounter 11/2022.  11/28/22 id clinic f/u Patient said he missed 2 days of medication symtuza and will pick up his refill I confirm again as 10/2022 labs showed viral load increasing to very high. I suspect he is not taking  He said he hasn't tried the imiquimod yet because the price was high; [facility] was able to get it down to 16 dollar per rx and he'll pick it up later Liver u/s obtained for chronic hep b showed no evidence of hepatoma Hasn't seen dental people since 09/2022 - still have lots of loose teeth; no dental pain  Not sexually the past month; no sx; refuse std testing today  10/16/22 id clinic f/u Missed 1 dose last 4 weeks of symtuza and tivicay Complains of at least months of irritating "scars" around anal area No bleeding there No other concern today  07/30/22 id clinic f/u He appears to be taking symtuza and tivicay for the past year He has been well controlled up to early 2021 but "life" happened. He reports to be in good  situation now and able to take medication regularly Missed 2-3 doses the last 4 weeks.   Hx anal cancer documented 2018 Per patient treated by [facility]/onc/rad-onc 01/2021 dr Moshe Cipro note --> chemo and radiation 01/2017 No recurrence as of 2021  Hx ckd3 Cr around 1.1-1.2 in 2021-2022  Social: Lives alone in [place] Few cousins and a brother in [place] Own a car and drives Not in a relationship at this time; "not really" sexually active Not working  On disability Smoke some kind of cigarettes; no other street drugs; etoh on weekend  No other complaint He has a dental appointment coming up at [facility]. Has dental carries and some pain; no fever/headache   HIV 1 RNA Quant  Date Value  05/15/2023 140 Copies/mL (H)  01/16/2023 27 Copies/mL (H)  11/28/2022 3,000 Copies/mL (H)  11/28/2022 4,910 copies/mL (H)   CD4 (no units)  Date Value  12/11/2016 See Separate Report   CD4 T Cell Abs (/uL)  Date Value  08/08/2022 138 (L)  08/08/2020 125 (L)  03/27/2020 114 (L)     Health Maintenance  Topic Date Due   Zoster Vaccines- Shingrix (1 of 2) Never done   Medicare Annual Wellness (AWV)  01/07/2017   Colonoscopy  Never done   COVID-19 Vaccine (3 - Pfizer risk series) 03/12/2021   INFLUENZA VACCINE  07/03/2023   DTaP/Tdap/Td (4 - Td  or Tdap) 07/14/2031   Hepatitis C Screening  Completed   HIV Screening  Completed   HPV VACCINES  Aged Out    ROS: All other ros negative    Objective:   Vitals:   11/13/23 1122  BP: 116/70  Pulse: 68  Temp: 98 F (36.7 C)  SpO2: 100%      General/constitutional: no distress, pleasant HEENT: Normocephalic, PER, Conj Clear, EOMI, Oropharynx clear -- poor dentition Neck supple CV: rrr no mrg Lungs: clear to auscultation, normal respiratory effort Abd: Soft, Nontender Ext: no edema Skin: No Rash Neuro: nonfocal MSK: no peripheral joint swelling/tenderness/warmth; back spines nontender    Labs: Lab Results  Component  Value Date   WBC 3.7 (L) 05/15/2023   HGB 13.2 05/15/2023   HCT 39.9 05/15/2023   MCV 89.5 05/15/2023   PLT 209 05/15/2023   Last metabolic panel Lab Results  Component Value Date   GLUCOSE 76 05/15/2023   NA 139 05/15/2023   K 4.3 05/15/2023   CL 108 05/15/2023   CO2 27 05/15/2023   BUN 12 05/15/2023   CREATININE 1.10 05/15/2023   EGFR 82 05/15/2023   CALCIUM 8.7 05/15/2023   PHOS 2.5 12/02/2018   PROT 7.0 05/15/2023   ALBUMIN 3.7 04/01/2020   BILITOT 0.5 05/15/2023   ALKPHOS 80 04/01/2020   AST 18 05/15/2023   ALT 20 05/15/2023   ANIONGAP 14 04/01/2020   HIV: Lab Results  Component Value Date   HIV1RNAQUANT 140 (H) 05/15/2023   Lab Results  Component Value Date   CD4TCELL 13 (L) 05/15/2023   CD4TABS 138 (L) 08/08/2022      Imaging: Reviewed  11/2022 liver u/s 1. Increased liver echogenicity is consistent with hepatic steatosis. 2. Small benign-appearing cysts in the liver.        Assessment & Plan:   #hiv #hep b coinfection On symtuza and tivicay for concern hx off and on meds  12/28 discuss with him viral load rising 10/2022. He reports taking meds. Asked him to make sure he is actually taking symtuza and tivicay each day  01/16/23 December labs showed improved but still not controlled viral load. He reports good compliance missing one dose last 4 weeks of symtuza/tivicay. Will repeat lab today and if viral load still >200 will check resistance testing. Discussed with him coinfection with hep b that he really need to be on meds to prevent getting resistance to hepatitis b as well   Told me today 05/15/23 no missed dose last 4 weeks   11/13/23 id assessment Patient might not have filled his medication regularly; missing days here and there His hep b viral load has been high and we checked hep b genotype/resistance profile 05/2023 --> fortunately no resistance seen yet      -discussed u=u -encourage compliance -continue current HIV  medication -labs today -f/u 3 months -discuss utmost importance hep b treatment adherence -- see more below for hep b     #chronic hepatitis B; eag positive 08/2022 viral load 217,000,000; 10/16/22 repeat viral load 100^7; not taking biktarvy. Persistently high viral load and 01/16/23 159k -- appears to be taking ART at that time liver u/s 11/2022 no hepatoma   -liver u/s ordered today 11/13/23 -hep b labs -continue symtuza/tivicay for hiv/hep b: no need to add other hepatitis b medication yet     #hx anal cancer #perianal wart Treated 2018 by xrt/cxt No evidence disease of anal cancer as of 2021 f/u with colorectal surgery Today 10/16/22 perianal  wart. Discussed they could go away or get bigger.  Had given imiquimod -- picked up in 01/2023 and used every now and then; still there  05/2023 anal pap no high risk hpv  Using imiquimod for his anal wart -- not worse  -repeat anal pap 05/2024 -continue imiquimod cream    #hx prostate cancer See urology Had chemotherapy and radiation  -f/u urology    #ckd Baseline cr 1.1-1.2  -repeat today    #poor dentition advise risk of heart infection -- discuss we have [facility] care here at [facility]  Referral to dental clinic but he hasn't come yet   - referral again arranged 11/13/23   ------------------ #hcm -vaccinations Will review -hepatitis Chronic hep b see above Hep c ab negative 08/2022 -std 08/2022 rpr negative -- repeat today Refuse triple screen again today triple screen 11/13/2023 -cancer screening Will discuss (colorectal and anal cancer) -pcp establishment Discuss he needs to arrange to get a primary care provider

## 2023-11-13 NOTE — Addendum Note (Signed)
Addended by: Philippa Chester on: 11/13/2023 11:56 AM   Modules accepted: Orders

## 2023-11-13 NOTE — Patient Instructions (Addendum)
Make sure you take your symtuza and tivicay -- very important for not hiv, but hepatitis b (which can cause liver cancer and get resistant and not many options    Liver ultrasound to screen for liver cancer (do this once a year)   See me in 3 months   If you have any issue with medication refill, contact me immediately; I do not want any lapse in your medications. Try to refill a couples weeks before he runs out   We did confirm that you shouldn't have copay on your symtuza and tivicay

## 2023-11-14 ENCOUNTER — Other Ambulatory Visit (HOSPITAL_COMMUNITY): Payer: Self-pay

## 2023-11-19 LAB — CBC
HCT: 41.7 % (ref 38.5–50.0)
Hemoglobin: 14 g/dL (ref 13.2–17.1)
MCH: 29.3 pg (ref 27.0–33.0)
MCHC: 33.6 g/dL (ref 32.0–36.0)
MCV: 87.2 fL (ref 80.0–100.0)
MPV: 12.3 fL (ref 7.5–12.5)
Platelets: 139 10*3/uL — ABNORMAL LOW (ref 140–400)
RBC: 4.78 10*6/uL (ref 4.20–5.80)
RDW: 12.3 % (ref 11.0–15.0)
WBC: 3.2 10*3/uL — ABNORMAL LOW (ref 3.8–10.8)

## 2023-11-19 LAB — AFP TUMOR MARKER: AFP-Tumor Marker: 8.1 ng/mL — ABNORMAL HIGH (ref <6.1)

## 2023-11-19 LAB — COMPLETE METABOLIC PANEL WITH GFR
AG Ratio: 1.1 (calc) (ref 1.0–2.5)
ALT: 20 U/L (ref 9–46)
AST: 19 U/L (ref 10–35)
Albumin: 4 g/dL (ref 3.6–5.1)
Alkaline phosphatase (APISO): 78 U/L (ref 35–144)
BUN: 9 mg/dL (ref 7–25)
CO2: 28 mmol/L (ref 20–32)
Calcium: 9.3 mg/dL (ref 8.6–10.3)
Chloride: 105 mmol/L (ref 98–110)
Creat: 1.03 mg/dL (ref 0.70–1.30)
Globulin: 3.5 g/dL (ref 1.9–3.7)
Glucose, Bld: 88 mg/dL (ref 65–99)
Potassium: 4.6 mmol/L (ref 3.5–5.3)
Sodium: 139 mmol/L (ref 135–146)
Total Bilirubin: 0.5 mg/dL (ref 0.2–1.2)
Total Protein: 7.5 g/dL (ref 6.1–8.1)
eGFR: 88 mL/min/{1.73_m2} (ref >=60)

## 2023-11-19 LAB — HEPATITIS B E ANTIGEN: Hep B E Ag: REACTIVE — AB

## 2023-11-19 LAB — HEPATITIS B DNA, ULTRAQUANTITATIVE, PCR: Hepatitis B virus DNA: >9 {Log} — ABNORMAL HIGH

## 2023-11-19 LAB — HEPATITIS B SURFACE ANTIGEN: Hepatitis B Surface Ag: REACTIVE — AB

## 2023-11-19 LAB — HIV-1 RNA QUANT-NO REFLEX-BLD
HIV 1 RNA Quant: 14600 {copies}/mL — ABNORMAL HIGH
HIV-1 RNA Quant, Log: 4.16 {Log} — ABNORMAL HIGH

## 2023-11-19 LAB — RPR: RPR Ser Ql: NONREACTIVE

## 2023-11-19 LAB — HEPATITIS B E ANTIBODY: Hep B E Ab: NONREACTIVE

## 2023-11-19 LAB — HEPATITIS B SURFACE ANTIBODY, QUANTITATIVE: Hep B S AB Quant (Post): <5 m[IU]/mL — ABNORMAL LOW (ref >=10)

## 2023-11-21 NOTE — Progress Notes (Unsigned)
HPI: Juan Harrison is a 52 y.o. male who presents to the [facility] pharmacy clinic for HIV follow-up.  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   DOLUTEGRAVIR (TIVICAY) 50 MG TABLET    TAKE 1 TABLET (50 MG TOTAL) BY MOUTH DAILY.   IMIQUIMOD (ALDARA) 5 % CREAM    Apply topically 3 (three) times a week.  Modified Medications   No medications on file  Discontinued Medications   No medications on file    Labs: Lab Results  Component Value Date   HIV1RNAQUANT 14,600 (H) 11/13/2023   HIV1RNAQUANT 140 (H) 05/15/2023   HIV1RNAQUANT 27 (H) 01/16/2023   CD4TABS 138 (L) 08/08/2022   CD4TABS 125 (L) 08/08/2020   CD4TABS 114 (L) 03/27/2020    RPR and STI Lab Results  Component Value Date   LABRPR NON-REACTIVE 11/13/2023   LABRPR NON-REACTIVE 05/15/2023   LABRPR NON-REACTIVE 01/16/2023   LABRPR NON-REACTIVE 08/08/2022   LABRPR NON-REACTIVE 01/12/2021   RPRTITER 1:1 02/29/2016    STI Results GC CT  02/29/2016 12:00 AM Negative  Negative     Hepatitis B Lab Results  Component Value Date   HEPBSAB NEG 02/29/2016   HEPBSAG REACTIVE (A) 11/13/2023   HEPBCAB NON-REACTIVE 08/08/2022   Hepatitis C Lab Results  Component Value Date   HEPCAB NON-REACTIVE 05/15/2023   Hepatitis A Lab Results  Component Value Date   HAV NON REACTIVE 02/29/2016   Lipids: Lab Results  Component Value Date   CHOL 119 03/27/2020   TRIG 52 03/27/2020   HDL 41 03/27/2020   CHOLHDL  2.9 03/27/2020   VLDL 14 02/29/2016   LDLCALC 65 03/27/2020    Current HIV Regimen: Symtuza + Tivicay  Assessment: Juan Harrison is here today to follow up for his HIV infection. He recently saw Dr. Renold Don on 11/13/23 where his HIV viral load was 14,600. He is prescribed Symtuza + Tivicay. Fill records show that his fill history has been terrible for 2024 - 12/03/22; 01/20/23; 03/17/23; 05/01/23; 07/09/23; and 11/14/23. He gets his medications at [facility] at [facility]. The pharmacy has a hard time getting in touch with him. He also has chronic Hepatitis B and his Hep B DNA was >100,000,000. 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   He states that he was off of medications x 2 weeks when he saw Dr. Renold Don a few weeks ago. I reviewed his fill history and asked him if that seemed correct and he said yes. I reminded him that he must fill it every month and that if he is really taking them daily then he should be running out of medication and needing refills about 30 days later. I asked the pharmacy and they said that he has never had a copay, so I am unsure the reason why he does not get his medications refilled on time. He doesn't seem too worried when I reviewed his HIV and Hep B labs with him today. He does say  that he tolerates the medications fine and does not have any side effects. And he never takes one without the other. The pharmacy has always dispensed both for him. He does not know the names of the medications when I asked him.  I advised on what can happen if he doesn't start taking this seriously and taking his medications every day. Discussed that he already has some resistance from not taking his medications in the past and that it could get much worse if he continues down this road. Also discussed his Hep B status and the risk of liver failure and cancer if his Hep B continues to be uncontrolled.   I will have him follow up with Dr. Renold Don in 1 month to reassess. I asked him to see if he  can go without any missed doses between now and when he follows up again in January. He agreed to try. Will check labs with resistance today to ensure that Symtuza and Tivicay will continue to work for him.   Plan: - Continue Symtuza and Tivicay daily - HIV RNA with genotype today - Follow up with Dr. Renold Don on 12/25/23  Karl Knarr L. Antonius Hartlage, PharmD, BCIDP, AAHIVP, CPP Clinical Pharmacist Practitioner Infectious Diseases Clinical Pharmacist [facility] 11/21/2023, 4:20 PM

## 2023-11-24 ENCOUNTER — Other Ambulatory Visit: Payer: Self-pay

## 2023-11-24 ENCOUNTER — Ambulatory Visit: Payer: Medicare Other | Admitting: Pharmacist

## 2023-11-24 DIAGNOSIS — Z91199 Patient's noncompliance with other medical treatment and regimen due to unspecified reason: Secondary | ICD-10-CM

## 2023-11-24 DIAGNOSIS — B20 Human immunodeficiency virus [HIV] disease: Secondary | ICD-10-CM | POA: Diagnosis not present

## 2023-11-24 NOTE — Progress Notes (Signed)
No showed

## 2023-11-27 ENCOUNTER — Other Ambulatory Visit: Payer: Self-pay

## 2023-11-27 NOTE — Progress Notes (Signed)
Specialty Pharmacy Refill Coordination Note  Juan Harrison is a 52 y.o. male contacted today regarding refills of specialty medication(s) Darun-Cobic-Emtricit-TenofAF (Symtuza); Dolutegravir Sodium (Tivicay)   Patient requested Pickup at [facility] at [facility] date: 12/08/23   Medication will be filled on 01.06.25.

## 2023-12-05 LAB — HIV RNA, RTPCR W/R GT (RTI, PI,INT)
HIV 1 RNA Quant: 925 {copies}/mL — ABNORMAL HIGH
HIV-1 RNA Quant, Log: 2.97 {Log_copies}/mL — ABNORMAL HIGH

## 2023-12-05 LAB — HIV-1 INTEGRASE GENOTYPE

## 2023-12-05 LAB — HIV-1 GENOTYPE: HIV-1 Genotype: DETECTED — AB

## 2023-12-08 ENCOUNTER — Other Ambulatory Visit: Payer: Self-pay

## 2023-12-15 ENCOUNTER — Ambulatory Visit (HOSPITAL_COMMUNITY): Payer: Medicare Other

## 2023-12-23 ENCOUNTER — Other Ambulatory Visit (HOSPITAL_COMMUNITY): Payer: Self-pay

## 2023-12-25 ENCOUNTER — Other Ambulatory Visit (HOSPITAL_COMMUNITY): Payer: Self-pay

## 2023-12-25 ENCOUNTER — Other Ambulatory Visit: Payer: Self-pay

## 2023-12-25 ENCOUNTER — Encounter: Payer: Self-pay | Admitting: Internal Medicine

## 2023-12-25 ENCOUNTER — Ambulatory Visit (INDEPENDENT_AMBULATORY_CARE_PROVIDER_SITE_OTHER): Payer: Medicare Other | Admitting: Internal Medicine

## 2023-12-25 VITALS — BP 120/79 | HR 71 | Resp 16 | Ht 75.0 in | Wt 144.4 lb

## 2023-12-25 DIAGNOSIS — B20 Human immunodeficiency virus [HIV] disease: Secondary | ICD-10-CM

## 2023-12-25 DIAGNOSIS — B181 Chronic viral hepatitis B without delta-agent: Secondary | ICD-10-CM | POA: Diagnosis not present

## 2023-12-25 DIAGNOSIS — B1910 Unspecified viral hepatitis B without hepatic coma: Secondary | ICD-10-CM | POA: Diagnosis not present

## 2023-12-25 DIAGNOSIS — Z9189 Other specified personal risk factors, not elsewhere classified: Secondary | ICD-10-CM

## 2023-12-25 NOTE — Progress Notes (Signed)
Subjective:    Patient ID: Juan Harrison, male  DOB: [birth year], 53 y.o.        MRN: [id number]  Cc - f/u hiv  HPI 53 yo M with hx of HIV/AIDS. Dx ~2007; MSM. ART hx- Darunavir-norvir/truvada/Epzicom bactrim (Hx of non-compliance).  He was changed to genvoya 05-2016. Switched to Symtuza/descovey 04-2020.  History of anal cancer and has been seen by [facility]c/Rad Onc for anal cancer. Finished CTX and XRT 01-2017.    12/25/23 id clinic f/u See below   05/15/23 id clinic f/u Social -- no new sexual partner/encounter since last visit with me He tried to take hiv meds every day and said no missed dose last 4 weeks Tried imiquimod off and on- perianal wart still there No other concern Reviewed hepatitis b labs and concerning for not controlled/resistance   01/16/23 id clinic f/u He said he has been taking tivicay and symtuza every day. Missed yesterday Need to pick up meds today No f/c No rash No concern in his health  Social -- last sexual encounter 11/2022.  11/28/22 id clinic f/u Patient said he missed 2 days of medication symtuza and will pick up his refill I confirm again as 10/2022 labs showed viral load increasing to very high. I suspect he is not taking  He said he hasn't tried the imiquimod yet because the price was high; [facility] was able to get it down to 16 dollar per rx and he'll pick it up later Liver u/s obtained for chronic hep b showed no evidence of hepatoma Hasn't seen dental people since 09/2022 - still have lots of loose teeth; no dental pain  Not sexually the past month; no sx; refuse std testing today  10/16/22 id clinic f/u Missed 1 dose last 4 weeks of symtuza and tivicay Complains of at least months of irritating "scars" around anal area No bleeding there No other concern today  07/30/22 id clinic f/u He appears to be taking symtuza and tivicay for the past year He has been well controlled up to early 2021 but "life" happened. He reports to be in good  situation now and able to take medication regularly Missed 2-3 doses the last 4 weeks.   Hx anal cancer documented 2018 Per patient treated by [facility]/onc/rad-onc 01/2021 dr Moshe Cipro note --> chemo and radiation 01/2017 No recurrence as of 2021  Hx ckd3 Cr around 1.1-1.2 in 2021-2022  Social: Lives alone in [place] Few cousins and a brother in [place] Own a car and drives Not in a relationship at this time; "not really" sexually active Not working  On disability Smoke some kind of cigarettes; no other street drugs; etoh on weekend  No other complaint He has a dental appointment coming up at [facility]. Has dental carries and some pain; no fever/headache   HIV 1 RNA Quant  Date Value  11/24/2023 925 copies/mL (H)  11/13/2023 14,600 Copies/mL (H)  05/15/2023 140 Copies/mL (H)   CD4 (no units)  Date Value  12/11/2016 See Separate Report   CD4 T Cell Abs (/uL)  Date Value  08/08/2022 138 (L)  08/08/2020 125 (L)  03/27/2020 114 (L)     Health Maintenance  Topic Date Due   Zoster Vaccines- Shingrix (1 of 2) Never done   Medicare Annual Wellness (AWV)  01/07/2017   Colonoscopy  Never done   Pneumococcal Vaccine 46-61 Years old (2 of 2 - PPSV23 or PCV20) 06/23/2018   COVID-19 Vaccine (3 - Pfizer risk series) 03/12/2021  INFLUENZA VACCINE  07/03/2023   DTaP/Tdap/Td (4 - Td or Tdap) 07/14/2031   Hepatitis C Screening  Completed   HIV Screening  Completed   HPV VACCINES  Aged Out    ROS: All other ros negative    Objective:   Vitals:   12/25/23 0846  BP: 120/79  Pulse: 71  Resp: 16      General/constitutional: no distress, pleasant HEENT: Normocephalic, PER, Conj Clear, EOMI, Oropharynx clear -- poor dentition -- several loose teeth Neck supple CV: rrr no mrg Lungs: clear to auscultation, normal respiratory effort Abd: Soft, Nontender Ext: no edema Skin: No Rash Neuro: nonfocal MSK: no peripheral joint swelling/tenderness/warmth; back spines  nontender    Labs: Lab Results  Component Value Date   WBC 3.2 (L) 11/13/2023   HGB 14.0 11/13/2023   HCT 41.7 11/13/2023   MCV 87.2 11/13/2023   PLT 139 (L) 11/13/2023   Last metabolic panel Lab Results  Component Value Date   GLUCOSE 88 11/13/2023   NA 139 11/13/2023   K 4.6 11/13/2023   CL 105 11/13/2023   CO2 28 11/13/2023   BUN 9 11/13/2023   CREATININE 1.03 11/13/2023   EGFR 88 11/13/2023   CALCIUM 9.3 11/13/2023   PHOS 2.5 12/02/2018   PROT 7.5 11/13/2023   ALBUMIN 3.7 04/01/2020   BILITOT 0.5 11/13/2023   ALKPHOS 80 04/01/2020   AST 19 11/13/2023   ALT 20 11/13/2023   ANIONGAP 14 04/01/2020   HIV: Lab Results  Component Value Date   HIV1RNAQUANT 925 (H) 11/24/2023   Lab Results  Component Value Date   CD4TCELL 13 (L) 05/15/2023   CD4TABS 138 (L) 08/08/2022      Imaging: Reviewed  11/2022 liver u/s 1. Increased liver echogenicity is consistent with hepatic steatosis. 2. Small benign-appearing cysts in the liver.        Assessment & Plan:   #hiv #hep b coinfection On symtuza and tivicay for concern hx off and on meds  12/28 discuss with him viral load rising 10/2022. He reports taking meds. Asked him to make sure he is actually taking symtuza and tivicay each day  01/16/23 December labs showed improved but still not controlled viral load. He reports good compliance missing one dose last 4 weeks of symtuza/tivicay. Will repeat lab today and if viral load still >200 will check resistance testing. Discussed with him coinfection with hep b that he really need to be on meds to prevent getting resistance to hepatitis b as well   Told me today 05/15/23 no missed dose last 4 weeks   11/13/23 id assessment Patient might not have filled his medication regularly; missing days here and there His hep b viral load has been high and we checked hep b genotype/resistance profile 05/2023 --> fortunately no resistance seen yet   12/25/23 id assessment Lab  Results  Component Value Date   HIV1RNAQUANT 925 (H) 11/24/2023   11/13/23 hbv rna log 8   He saw Cassie 12/23 who extensively discussed medication compliance with patient and discussed danger of hbv and hiv not under control. Appears patient doesn't fill his meds frequently. He doesn't want it mailed  I stress the need to take med to preserve life/his immunity/his liver function among others. He acknowledge  Today plan is to have standing hiv and hepatitis b labs for the next 6 months once every 4-6 weeks  05/2023 hepatitis b genotype No precore mutation Antiviral drugs  Resistance    Mutations                                      Predicted  _______________________________________________________                                 !   !         Polymerase Inhibitors    !   !  3TC  (lamivudine or Epivir)     ! NO!  LDT  (telbivudine or Tyzeka)    ! NO!  ADV  (adefovir or Hepsera)      ! NO!  ETV  (entecavir or Baraclude)   ! NO!   11/2023 hiv genotype [id number]       -discussed u=u -encourage compliance -continue current HIV medication -labs today -labs every 4 weeks for 6 months -see me in 12 weeks -see Cassie/Amanda in 6 weeks     #chronic hepatitis B; eag positive 08/2022 viral load 217,000,000; 10/16/22 repeat viral load 100^7; not taking biktarvy. Persistently high viral load and 01/16/23 159k -- appears to be taking ART at that time liver u/s 11/2022 no hepatoma Genotype as above 05/2023   -liver u/s -labs today -standing labs every 4-6 weeks for 6 months -will not add entecavir today    #need for dental care Several loose teeth  -advise patient to setup dental appointment at front desk   Not discussed 12/2023 below --------------------- #hx anal cancer #perianal wart Treated 2018 by xrt/cxt No evidence disease of anal cancer as of 2021 f/u with colorectal surgery Today 10/16/22 perianal wart. Discussed they could go away or get bigger.  Had  given imiquimod -- picked up in 01/2023 and used every now and then; still there  05/2023 anal pap no high risk hpv  Using imiquimod for his anal wart -- not worse  -repeat anal pap 05/2024 -continue imiquimod cream    #hx prostate cancer See urology Had chemotherapy and radiation  -f/u urology    #ckd Baseline cr 1.1-1.2     ------------------ #hcm -vaccinations Will review -hepatitis Chronic hep b see above Hep c ab negative 08/2022 -std 08/2022 rpr negative -- repeat today Refuse triple screen again today triple screen 11/13/2023 -cancer screening Will discuss (colorectal and anal cancer) -[facility] establishment Discuss he needs to arrange to get a primary care provider

## 2023-12-25 NOTE — Patient Instructions (Addendum)
Please make sure you take these two medications EVERY DAY 1) symtuza 2) tivicay  Pick these up every month when you run out    Schedule your liver ultrasound to screen for liver cancer     I have standing labs for you to do: 1) do a set today 2) please schedule with front desk for lab visits --> come every 4-6 weeks for the next 6 months so we can make sure you are responding    Follow up: 1) please see our pharmacist Cassie or Marchelle Folks to review labs in 6 weeks (after your next labs) 2) see me in about 2.5 months (for another labs review)   Dental care: Front desk can help you arrange routine dental care

## 2023-12-29 LAB — COMPLETE METABOLIC PANEL WITH GFR
AG Ratio: 1.1 (calc) (ref 1.0–2.5)
ALT: 26 U/L (ref 9–46)
AST: 23 U/L (ref 10–35)
Albumin: 3.6 g/dL (ref 3.6–5.1)
Alkaline phosphatase (APISO): 69 U/L (ref 35–144)
BUN: 11 mg/dL (ref 7–25)
CO2: 27 mmol/L (ref 20–32)
Calcium: 8.9 mg/dL (ref 8.6–10.3)
Chloride: 107 mmol/L (ref 98–110)
Creat: 1.08 mg/dL (ref 0.70–1.30)
Globulin: 3.2 g/dL (ref 1.9–3.7)
Glucose, Bld: 95 mg/dL (ref 65–99)
Potassium: 4.2 mmol/L (ref 3.5–5.3)
Sodium: 140 mmol/L (ref 135–146)
Total Bilirubin: 0.4 mg/dL (ref 0.2–1.2)
Total Protein: 6.8 g/dL (ref 6.1–8.1)
eGFR: 83 mL/min/{1.73_m2} (ref >=60)

## 2023-12-29 LAB — HIV-1 RNA QUANT-NO REFLEX-BLD
HIV 1 RNA Quant: NOT DETECTED {copies}/mL
HIV-1 RNA Quant, Log: NOT DETECTED {Log}

## 2023-12-29 LAB — HEPATITIS B DNA, ULTRAQUANTITATIVE, PCR
Hepatitis B DNA: 2070000 [IU]/mL — ABNORMAL HIGH
Hepatitis B virus DNA: 6.32 {Log} — ABNORMAL HIGH

## 2023-12-29 LAB — HEPATITIS B E ANTIGEN: Hep B E Ag: REACTIVE — AB

## 2023-12-29 LAB — HEPATITIS B E ANTIBODY: Hep B E Ab: NONREACTIVE

## 2023-12-31 NOTE — Progress Notes (Signed)
Actually he gets labs the week before I see him.. do you want to add that on?

## 2023-12-31 NOTE — Progress Notes (Signed)
Yikes. I see him om 2/26. I will add on the Hep B genotype!

## 2024-01-01 ENCOUNTER — Other Ambulatory Visit: Payer: Self-pay

## 2024-01-01 ENCOUNTER — Other Ambulatory Visit: Payer: Self-pay | Admitting: Internal Medicine

## 2024-01-01 ENCOUNTER — Other Ambulatory Visit (HOSPITAL_COMMUNITY): Payer: Self-pay

## 2024-01-01 DIAGNOSIS — B20 Human immunodeficiency virus [HIV] disease: Secondary | ICD-10-CM

## 2024-01-01 MED ORDER — SYMTUZA 800-150-200-10 MG PO TABS
1.0000 | ORAL_TABLET | Freq: Every day | ORAL | 0 refills | Status: DC
  Filled 2024-01-01: qty 30, 30d supply, fill #0

## 2024-01-01 MED ORDER — TIVICAY 50 MG PO TABS
ORAL_TABLET | Freq: Every day | ORAL | 0 refills | Status: DC
  Filled 2024-01-01: qty 30, 30d supply, fill #0

## 2024-01-01 NOTE — Progress Notes (Signed)
Hep b dna very high  Will see Cassie in a few weeks  Will add hep b genotype to next set of labs

## 2024-01-01 NOTE — Addendum Note (Signed)
Addended by: Rutha Bouchard T on: 01/01/2024 10:42 AM   Modules accepted: Orders

## 2024-01-01 NOTE — Progress Notes (Signed)
Specialty Pharmacy Refill Coordination Note  Juan Harrison is a 53 y.o. male contacted today regarding refills of specialty medication(s) Darun-Cobic-Emtricit-TenofAF (Symtuza); Dolutegravir Sodium (Tivicay)   Patient requested Pickup at [facility] at [facility] date: 01/08/24   Medication will be filled on 01/07/24.

## 2024-01-06 ENCOUNTER — Ambulatory Visit (HOSPITAL_COMMUNITY)
Admission: RE | Admit: 2024-01-06 | Discharge: 2024-01-06 | Disposition: A | Discharge status: Home / Self Care | Payer: Medicare Other | Source: Ambulatory Visit | Attending: Internal Medicine | Admitting: Internal Medicine

## 2024-01-06 DIAGNOSIS — B181 Chronic viral hepatitis B without delta-agent: Secondary | ICD-10-CM | POA: Diagnosis present

## 2024-01-06 DIAGNOSIS — K769 Liver disease, unspecified: Secondary | ICD-10-CM | POA: Diagnosis not present

## 2024-01-06 DIAGNOSIS — K7689 Other specified diseases of liver: Secondary | ICD-10-CM | POA: Diagnosis not present

## 2024-01-09 ENCOUNTER — Other Ambulatory Visit: Payer: Self-pay

## 2024-01-22 ENCOUNTER — Other Ambulatory Visit: Payer: Medicare Other

## 2024-01-24 ENCOUNTER — Ambulatory Visit (INDEPENDENT_AMBULATORY_CARE_PROVIDER_SITE_OTHER): Payer: Medicare Other

## 2024-01-24 ENCOUNTER — Ambulatory Visit (HOSPITAL_COMMUNITY)
Admission: EM | Admit: 2024-01-24 | Discharge: 2024-01-24 | Disposition: A | Discharge status: Home / Self Care | Payer: Medicare Other | Attending: Internal Medicine | Admitting: Internal Medicine

## 2024-01-24 ENCOUNTER — Encounter (HOSPITAL_COMMUNITY): Payer: Self-pay | Admitting: *Deleted

## 2024-01-24 DIAGNOSIS — R0602 Shortness of breath: Secondary | ICD-10-CM | POA: Diagnosis not present

## 2024-01-24 DIAGNOSIS — J069 Acute upper respiratory infection, unspecified: Secondary | ICD-10-CM

## 2024-01-24 DIAGNOSIS — R059 Cough, unspecified: Secondary | ICD-10-CM | POA: Diagnosis not present

## 2024-01-24 DIAGNOSIS — J40 Bronchitis, not specified as acute or chronic: Secondary | ICD-10-CM

## 2024-01-24 DIAGNOSIS — R051 Acute cough: Secondary | ICD-10-CM | POA: Diagnosis not present

## 2024-01-24 LAB — POC COVID19/FLU A&B COMBO
Covid Antigen, POC: NEGATIVE
Influenza A Antigen, POC: NEGATIVE
Influenza B Antigen, POC: NEGATIVE

## 2024-01-24 MED ORDER — ALBUTEROL SULFATE HFA 108 (90 BASE) MCG/ACT IN AERS: 1.0000 | INHALATION_SPRAY | Freq: Four times a day (QID) | RESPIRATORY_TRACT | 0 refills | Status: AC | PRN

## 2024-01-24 MED ORDER — PREDNISONE 20 MG PO TABS: 40.0000 mg | ORAL_TABLET | Freq: Every day | ORAL | 0 refills | Status: AC

## 2024-01-24 MED ORDER — PROMETHAZINE-DM 6.25-15 MG/5ML PO SYRP: 5.0000 mL | ORAL_SOLUTION | Freq: Three times a day (TID) | ORAL | 0 refills | Status: AC | PRN

## 2024-01-24 MED ORDER — AZITHROMYCIN 250 MG PO TABS: 250.0000 mg | ORAL_TABLET | Freq: Every day | ORAL | 0 refills | Status: AC

## 2024-01-24 NOTE — Discharge Instructions (Addendum)
 Chest x-ray done today.  The final radiology evaluation is still pending but on brief evaluation there does not appear to be pneumonia present however cannot completely rule out possible bronchitis or early pneumonia.  Flu a flu B and COVID testing done today as well and these are negative. We will treat with the following given the severity and duration of symptoms: Azithromycin 250mg  Take 2 tablets today and the 1 tablet daily for 4 more days.  Promethazine DM 5 mL every 8 hours as needed for cough.  Use caution as this medication can cause drowsiness. Prednisone 40 mg (2 tablets) once daily for 5 days. Take this in the morning.  This is a steroid to help with inflammation and pain.  Albuterol inhaler 1-2 puffs every 6 hours as needed for wheezing/shortness of breath.  Rest and stay hydrated.  Drink plenty of fluids even if you do not feel like eating solid food Return to urgent care or PCP if symptoms worsen or fail to resolve.

## 2024-01-24 NOTE — ED Provider Notes (Signed)
 [facility]    CSN: [id number] Arrival date & time: 01/24/24  1435      History   Chief Complaint Chief Complaint  Patient presents with   Cough   Shortness of Breath   Diarrhea    HPI Juan Harrison is a 53 y.o. male.   53 year old male who presents urgent care with complaints of cough, shortness of breath, fever, chills, fatigue, diarrhea and generalized bodyaches.  He reports that the symptoms started Tuesday night.  He reports that he took a bath and then went to bed.  He woke up in the night feeling extremely fatigued and coughing.  The cough is progressed to shortness of breath and generalized bodyaches.  He denies any sick exposures that he knows of.  He has been using over-the-counter medications such as NyQuil and TheraFlu without relief.  He also has a headache associated with his symptoms.   Cough Associated symptoms: chills, fever, headaches and shortness of breath   Associated symptoms: no chest pain, no ear pain, no rash and no sore throat   Shortness of Breath Associated symptoms: cough, fever and headaches   Associated symptoms: no abdominal pain, no chest pain, no ear pain, no rash, no sore throat and no vomiting   Diarrhea Associated symptoms: chills, fever and headaches   Associated symptoms: no abdominal pain, no arthralgias and no vomiting     Past Medical History:  Diagnosis Date   AIDS (HCC) infectious disease--- dr hatcher   dx 2007,  usCD4,  count at 110   Anal cancer [facility]) oncologist-  dr feng/  dr moody   intial dx 12-10-2016--  Invasive Squamous Cell carcinoma in situ, Stage IIIA ([id number]) --- completed concurrent radiation and chemotherapy (rxt 01-27-2017 chemo 01-24-2017)     Anal condyloma    Hepatitis B surface antigen positive    History of cancer chemotherapy 12-16-2016 to 01-24-2017   History of radiation therapy 12-16-2016 to 01-27-2017   55Gy in 30 sessions   Pulmonary nodule, right    per Chest CT 12-06-2008    Rectal mass    anal    Patient Active Problem List   Diagnosis Date Noted   Poor dentition 07/06/2020   Porokeratosis 05/23/2020   CKD (chronic kidney disease) stage 2, GFR 60-89 ml/min 04/28/2018   Herpes genitalis in men 02/12/2018   Tobacco use 10/13/2017   Anal cancer (HCC) 12/10/2016   Hepatitis B immune 10/29/2016   AIDS (HCC) 12/02/2005    Past Surgical History:  Procedure Laterality Date   IR GENERIC HISTORICAL  12/16/2016   IR US GUIDE VASC ACCESS RIGHT 12/16/2016 Oley Balm, MD [facility]   IR GENERIC HISTORICAL  12/16/2016   IR FLUORO GUIDE CV LINE RIGHT 12/16/2016 Oley Balm, MD [facility]   RECTAL BIOPSY N/A 11/29/2016   Procedure: BIOPSY ANAL CANAL MASS;  Surgeon: Romie Levee, MD;  Location: [facility];  Service: General;  Laterality: N/A;   RECTAL EXAM UNDER ANESTHESIA N/A 11/29/2016   Procedure: EXAM UNDER ANESTHESIA;  Surgeon: Romie Levee, MD;  Location: [facility];  Service: General;  Laterality: N/A;   RECTAL EXAM UNDER ANESTHESIA N/A 08/01/2017   Procedure: ANAL EXAM UNDER ANESTHESIA WITH BIOPSY;  Surgeon: Romie Levee, MD;  Location: [facility];  Service: General;  Laterality: N/A;   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 Authorizing Provider  Darunavir-Cobicistat-Emtricitabine-Tenofovir Alafenamide (SYMTUZA) 800-150-200-10 MG TABS TAKE 1 TABLET BY MOUTH DAILY WITH BREAKFAST. 01/01/24 12/31/24 Yes Vu, Tonita Phoenix, MD  dolutegravir (TIVICAY) 50 MG tablet TAKE 1 TABLET (50 MG TOTAL) BY MOUTH DAILY. 01/01/24 12/31/24 Yes Vu, Tonita Phoenix, MD  imiquimod (ALDARA) 5 % cream Apply topically 3 (three) times a week. Patient not taking: Reported on 01/16/2023 11/11/22   Kuppelweiser, Cherylann Ratel, RPH-CPP    Family History Family History  Problem Relation Age of Onset   COPD Mother    Asthma Brother      Social History Social History   Tobacco Use   Smoking status: Every Day    Types: Cigars    Passive exposure: Never   Smokeless tobacco: Never   Tobacco comments:    3 black and milds a day  Substance Use Topics   Alcohol use: No    Alcohol/week: 1.0 standard drink of alcohol    Types: 1 Standard drinks or equivalent per week    Comment: previously drank alcohol on weekends for 25 years, he quit on 10/14/2016    Drug use: Not Currently    Types: Marijuana     Allergies   Patient has no known allergies.   Review of Systems Review of Systems  Constitutional:  Positive for appetite change, chills, fatigue and fever.  HENT:  Negative for ear pain and sore throat.   Eyes:  Negative for pain and visual disturbance.  Respiratory:  Positive for cough and shortness of breath.   Cardiovascular:  Negative for chest pain and palpitations.  Gastrointestinal:  Positive for diarrhea. Negative for abdominal pain and vomiting.  Genitourinary:  Negative for dysuria and hematuria.  Musculoskeletal:  Negative for arthralgias and back pain.  Skin:  Negative for color change and rash.  Neurological:  Positive for headaches. Negative for seizures and syncope.  All other systems reviewed and are negative.    Physical Exam Triage Vital Signs ED Triage Vitals  Encounter Vitals Group     BP 01/24/24 1522 99/65     Systolic BP Percentile --      Diastolic BP Percentile --      Pulse Rate 01/24/24 1522 (!) 109     Resp 01/24/24 1522 20     Temp 01/24/24 1522 98 F (36.7 C)     Temp Source 01/24/24 1522 Oral     SpO2 01/24/24 1522 97 %     Weight --      Height --      Head Circumference --      Peak Flow --      Pain Score 01/24/24 1521 0     Pain Loc --      Pain Education --      Exclude from Growth Chart --    No data found.  Updated Vital Signs BP 99/65 (BP Location: Left Arm)   Pulse (!) 109   Temp 98 F (36.7 C) (Oral)   Resp 20   SpO2 97%   Visual Acuity Right  Eye Distance:   Left Eye Distance:   Bilateral Distance:    Right Eye Near:   Left Eye Near:    Bilateral Near:     Physical Exam Vitals and nursing note reviewed.  Constitutional:      General: He is not in acute distress.    Appearance: He is well-developed.     Comments: Patient appears to not feel well  HENT:     Head: Normocephalic and atraumatic.  Eyes:     Conjunctiva/sclera: Conjunctivae normal.  Cardiovascular:     Rate and Rhythm: Normal rate and regular rhythm.     Heart sounds: No murmur heard. Pulmonary:     Effort: Pulmonary effort is normal. No respiratory distress.     Breath sounds: Examination of the right-upper field reveals decreased breath sounds. Examination of the left-upper field reveals decreased breath sounds. Examination of the left-middle field reveals rhonchi. Decreased breath sounds and rhonchi present. No wheezing.  Abdominal:     Palpations: Abdomen is soft.     Tenderness: There is no abdominal tenderness.  Musculoskeletal:        General: No swelling.     Cervical back: Neck supple.  Skin:    General: Skin is warm and dry.     Capillary Refill: Capillary refill takes less than 2 seconds.  Neurological:     Mental Status: He is alert.  Psychiatric:        Mood and Affect: Mood normal.      UC Treatments / Results  Labs (all labs ordered are listed, but only abnormal results are displayed) Labs Reviewed  POC COVID19/FLU A&B COMBO    EKG   Radiology No results found.  Procedures Procedures (including critical care time)  Medications Ordered in UC Medications - No data to display  Initial Impression / Assessment and Plan / UC Course  I have reviewed the triage vital signs and the nursing notes.  Pertinent labs & imaging results that were available during my care of the patient were reviewed by me and considered in my medical decision making (see chart for details).     Acute cough - Plan: DG Chest 2 View, DG Chest 2  View  Acute upper respiratory infection  Bronchitis   Chest x-ray done today.  The final radiology evaluation is still pending but on brief evaluation there does not appear to be pneumonia present however cannot completely rule out possible bronchitis or early pneumonia.  Flu a flu B and COVID testing done today as well and these are negative. We will treat with the following given the severity and duration of symptoms: Azithromycin 250mg  Take 2 tablets today and the 1 tablet daily for 4 more days.  Promethazine DM 5 mL every 8 hours as needed for cough.  Use caution as this medication can cause drowsiness. Prednisone 40 mg (2 tablets) once daily for 5 days. Take this in the morning.  This is a steroid to help with inflammation and pain.  Albuterol inhaler 1-2 puffs every 6 hours as needed for wheezing/shortness of breath.  Rest and stay hydrated.  Drink plenty of fluids even if you do not feel like eating solid food Return to urgent care or PCP if symptoms worsen or fail to resolve.    Final Clinical Impressions(s) / UC Diagnoses   Final diagnoses:  Acute cough   Discharge Instructions   None    ED Prescriptions   None    PDMP not reviewed this encounter.   Landis Martins, [place] 01/24/24 1658

## 2024-01-24 NOTE — ED Triage Notes (Signed)
 Pt states he has had cough, SOB and diarrhea since Tuesday. He has been taking OTC meds like nyquil and theraflu.

## 2024-01-28 ENCOUNTER — Ambulatory Visit: Payer: Medicare Other | Admitting: Pharmacist

## 2024-01-28 NOTE — Progress Notes (Unsigned)
 HPI: Juan Harrison is a 53 y.o. male who presents to the [facility] pharmacy clinic for HIV follow-up.  Patient Active Problem List   Diagnosis Date Noted   Poor dentition 07/06/2020   Porokeratosis 05/23/2020   CKD (chronic kidney disease) stage 2, GFR 60-89 ml/min 04/28/2018   Herpes genitalis in men 02/12/2018   Tobacco use 10/13/2017   Anal cancer (HCC) 12/10/2016   Hepatitis B immune 10/29/2016   AIDS (HCC) 12/02/2005    Patient's Medications  New Prescriptions   No medications on file  Previous Medications   ALBUTEROL (VENTOLIN HFA) 108 (90 BASE) MCG/ACT INHALER    Inhale 1-2 puffs into the lungs every 6 (six) hours as needed for wheezing or shortness of breath.   AZITHROMYCIN (ZITHROMAX) 250 MG TABLET    Take 1 tablet (250 mg total) by mouth daily. Take first 2 tablets together, then 1 every day until finished.   DARUNAVIR-COBICISTAT-EMTRICITABINE-TENOFOVIR ALAFENAMIDE (SYMTUZA) 800-150-200-10 MG TABS    TAKE 1 TABLET BY MOUTH DAILY WITH BREAKFAST.   DOLUTEGRAVIR (TIVICAY) 50 MG TABLET    TAKE 1 TABLET (50 MG TOTAL) BY MOUTH DAILY.   IMIQUIMOD (ALDARA) 5 % CREAM    Apply topically 3 (three) times a week.   PREDNISONE (DELTASONE) 20 MG TABLET    Take 2 tablets (40 mg total) by mouth daily with breakfast for 5 days.   PROMETHAZINE-DEXTROMETHORPHAN (PROMETHAZINE-DM) 6.25-15 MG/5ML SYRUP    Take 5 mLs by mouth every 8 (eight) hours as needed for cough.  Modified Medications   No medications on file  Discontinued Medications   No medications on file    Labs: Lab Results  Component Value Date   HIV1RNAQUANT Not Detected 12/25/2023   HIV1RNAQUANT 925 (H) 11/24/2023   HIV1RNAQUANT 14,600 (H) 11/13/2023   CD4TABS 138 (L) 08/08/2022   CD4TABS 125 (L) 08/08/2020   CD4TABS 114 (L) 03/27/2020    RPR and STI Lab Results  Component Value Date   LABRPR NON-REACTIVE 11/13/2023   LABRPR NON-REACTIVE 05/15/2023   LABRPR NON-REACTIVE 01/16/2023   LABRPR NON-REACTIVE 08/08/2022    LABRPR NON-REACTIVE 01/12/2021   RPRTITER 1:1 02/29/2016    STI Results GC CT  02/29/2016 12:00 AM Negative  Negative     Hepatitis B Lab Results  Component Value Date   HEPBSAB NEG 02/29/2016   HEPBSAG REACTIVE (A) 11/13/2023   HEPBCAB NON-REACTIVE 08/08/2022   Hepatitis C Lab Results  Component Value Date   HEPCAB NON-REACTIVE 05/15/2023   Hepatitis A Lab Results  Component Value Date   HAV NON REACTIVE 02/29/2016   Lipids: Lab Results  Component Value Date   CHOL 119 03/27/2020   TRIG 52 03/27/2020   HDL 41 03/27/2020   CHOLHDL 2.9 03/27/2020   VLDL 14 02/29/2016   LDLCALC 65 03/27/2020    Current HIV Regimen: Symtuza + tivicay   Assessment:  Juan Harrison presents today for HIV follow up at the request of Dr. Renold Don. Was last seen by him 12/25/23 and at that visit his HIV RNA was undetectable. Dr. Renold Don would like to have labs every 4-6 weeks and has scheduled appointments for him with lab for the next 6 months. The last CD4 count was drawn over 6 months ago and was 174, so will order that today in addition to another HIV/HepB viral load.  Juan Harrison is currently taking Symtuza + Tivicay for both HIV and Hepatitis B. He reports adherence to his current regimen with no missed doses over the past month and his recent  fill history does confirm this (last fills 01/14/24). He is taking both medications together in the mornings and is not taking any supplements or other medications with them. He stated he has not been experiencing any issues and does not have any concerns with the medications. He has not been sexually active and would not like STI testing today.  Confirmed he went to the doctor for SOB/chest pain/cough last week and they gave him azithromycin which he has today's dose left to take to complete the 5 day course. He was also prescribed an albuterol inhaler that he has been using in the morning and in the evenings for shortness of breath. Of note, he was short of breath  during our visit today at rest and while speaking. He expressed he has seen improvement with the inhaler and the antibiotic but has not yet fully resolved. Encouraged him to use his inhaler more frequently as he needs for shortness of breath (as prescribed) and to reach back out to his doctor if his symptoms do not improve over the next week. He reported that since he started feeling ill, he lost his appetite and would only eat once daily, losing about 5-10 pounds in the last few weeks. As his symptoms began to resolve over the last few days he has been extremely hungry and is now eating three meals per day and is hopeful things will start to improve. Emphasized that his labs have begun to trend in the right direction now that he has become adherent to his HIV/HepB regimen and should continue to do so as long as he remains adherent. Encouraged him to stick to the positive habits he has made recently and to attend all appointments scheduled to ensure he is improving. Also helped him log in to his MyChart account so we can better communicate with him in the future.  IMZ: PCV20, covid/flu, shingles, HepA, mpox which he was not interested in today.  Plan: - HIV RNA, CD4 - HepB DNA - Follow up 02/19/24 with Dr. Renold Don - Reach out with and questions or concerns  Stephenie Acres, PharmD PGY1 Pharmacy Resident 01/28/2024 10:38 PM

## 2024-01-28 NOTE — Progress Notes (Unsigned)
   HPI: Juan Harrison is a 53 y.o. male who presents to the [facility] pharmacy clinic for HIV follow-up.  Patient Active Problem List   Diagnosis Date Noted   Poor dentition 07/06/2020   Porokeratosis 05/23/2020   CKD (chronic kidney disease) stage 2, GFR 60-89 ml/min 04/28/2018   Herpes genitalis in men 02/12/2018   Tobacco use 10/13/2017   Anal cancer (HCC) 12/10/2016   Hepatitis B immune 10/29/2016   AIDS (HCC) 12/02/2005    Patient's Medications  New Prescriptions   No medications on file  Previous Medications   ALBUTEROL (VENTOLIN HFA) 108 (90 BASE) MCG/ACT INHALER    Inhale 1-2 puffs into the lungs every 6 (six) hours as needed for wheezing or shortness of breath.   AZITHROMYCIN (ZITHROMAX) 250 MG TABLET    Take 1 tablet (250 mg total) by mouth daily. Take first 2 tablets together, then 1 every day until finished.   DARUNAVIR-COBICISTAT-EMTRICITABINE-TENOFOVIR ALAFENAMIDE (SYMTUZA) 800-150-200-10 MG TABS    TAKE 1 TABLET BY MOUTH DAILY WITH BREAKFAST.   DOLUTEGRAVIR (TIVICAY) 50 MG TABLET    TAKE 1 TABLET (50 MG TOTAL) BY MOUTH DAILY.   IMIQUIMOD (ALDARA) 5 % CREAM    Apply topically 3 (three) times a week.   PREDNISONE (DELTASONE) 20 MG TABLET    Take 2 tablets (40 mg total) by mouth daily with breakfast for 5 days.   PROMETHAZINE-DEXTROMETHORPHAN (PROMETHAZINE-DM) 6.25-15 MG/5ML SYRUP    Take 5 mLs by mouth every 8 (eight) hours as needed for cough.  Modified Medications   No medications on file  Discontinued Medications   No medications on file    Labs: Lab Results  Component Value Date   HIV1RNAQUANT Not Detected 12/25/2023   HIV1RNAQUANT 925 (H) 11/24/2023   HIV1RNAQUANT 14,600 (H) 11/13/2023   CD4TABS 138 (L) 08/08/2022   CD4TABS 125 (L) 08/08/2020   CD4TABS 114 (L) 03/27/2020    RPR and STI Lab Results  Component Value Date   LABRPR NON-REACTIVE 11/13/2023   LABRPR NON-REACTIVE 05/15/2023   LABRPR NON-REACTIVE 01/16/2023   LABRPR NON-REACTIVE 08/08/2022    LABRPR NON-REACTIVE 01/12/2021   RPRTITER 1:1 02/29/2016    STI Results GC CT  02/29/2016 12:00 AM Negative  Negative     Hepatitis B Lab Results  Component Value Date   HEPBSAB NEG 02/29/2016   HEPBSAG REACTIVE (A) 11/13/2023   HEPBCAB NON-REACTIVE 08/08/2022   Hepatitis C Lab Results  Component Value Date   HEPCAB NON-REACTIVE 05/15/2023   Hepatitis A Lab Results  Component Value Date   HAV NON REACTIVE 02/29/2016   Lipids: Lab Results  Component Value Date   CHOL 119 03/27/2020   TRIG 52 03/27/2020   HDL 41 03/27/2020   CHOLHDL 2.9 03/27/2020   VLDL 14 02/29/2016   LDLCALC 65 03/27/2020    Current HIV Regimen: ***  Assessment: ***  Plan: ***  Cassie L. Kuppelweiser, PharmD, BCIDP, AAHIVP, CPP Clinical Pharmacist Practitioner Infectious Diseases Clinical Pharmacist [facility] 01/28/2024, 7:41 AM

## 2024-01-29 ENCOUNTER — Other Ambulatory Visit: Payer: Self-pay

## 2024-01-29 ENCOUNTER — Ambulatory Visit (INDEPENDENT_AMBULATORY_CARE_PROVIDER_SITE_OTHER): Payer: Medicare Other | Admitting: Pharmacist

## 2024-01-29 DIAGNOSIS — B20 Human immunodeficiency virus [HIV] disease: Secondary | ICD-10-CM

## 2024-01-29 DIAGNOSIS — B181 Chronic viral hepatitis B without delta-agent: Secondary | ICD-10-CM | POA: Diagnosis not present

## 2024-01-29 DIAGNOSIS — Z0000 Encounter for general adult medical examination without abnormal findings: Secondary | ICD-10-CM

## 2024-01-29 DIAGNOSIS — Z113 Encounter for screening for infections with a predominantly sexual mode of transmission: Secondary | ICD-10-CM

## 2024-01-30 ENCOUNTER — Other Ambulatory Visit: Payer: Self-pay

## 2024-01-30 LAB — T-HELPER CELLS (CD4) COUNT (NOT AT ARMC)
CD4 % Helper T Cell: 15 % — ABNORMAL LOW (ref 33–65)
CD4 T Cell Abs: 124 /uL — ABNORMAL LOW (ref 400–1790)

## 2024-02-01 LAB — HIV-1 RNA QUANT-NO REFLEX-BLD
HIV 1 RNA Quant: NOT DETECTED {copies}/mL
HIV-1 RNA Quant, Log: NOT DETECTED {Log_copies}/mL

## 2024-02-01 LAB — HEPATITIS B DNA, ULTRAQUANTITATIVE, PCR
Hepatitis B DNA: 683000 [IU]/mL — ABNORMAL HIGH
Hepatitis B virus DNA: 5.83 {Log_IU}/mL — ABNORMAL HIGH

## 2024-02-03 ENCOUNTER — Other Ambulatory Visit: Payer: Self-pay

## 2024-02-05 ENCOUNTER — Other Ambulatory Visit: Payer: Medicare Other

## 2024-02-09 ENCOUNTER — Other Ambulatory Visit (HOSPITAL_COMMUNITY): Payer: Self-pay

## 2024-02-13 ENCOUNTER — Other Ambulatory Visit: Payer: Self-pay

## 2024-02-19 ENCOUNTER — Ambulatory Visit: Payer: Self-pay | Admitting: Internal Medicine

## 2024-02-19 ENCOUNTER — Telehealth: Payer: Self-pay

## 2024-02-19 NOTE — Telephone Encounter (Signed)
 Called [name] to see if he was going to make it to his appointment today, no answer and no voicemail set up.   If patient calls back, Dr. Renold Don would like him to be added to the lab schedule and would like the following:  - HIV RNA - CMP - CBC - Hep B surface Ag - HBV DNA - Hep B resistance  Sandie Ano, RN

## 2024-02-26 ENCOUNTER — Other Ambulatory Visit: Payer: Self-pay

## 2024-02-26 ENCOUNTER — Ambulatory Visit

## 2024-02-26 DIAGNOSIS — Z113 Encounter for screening for infections with a predominantly sexual mode of transmission: Secondary | ICD-10-CM

## 2024-02-26 DIAGNOSIS — B20 Human immunodeficiency virus [HIV] disease: Secondary | ICD-10-CM

## 2024-02-26 DIAGNOSIS — B181 Chronic viral hepatitis B without delta-agent: Secondary | ICD-10-CM | POA: Diagnosis not present

## 2024-02-26 NOTE — Addendum Note (Signed)
 Addended by: Linna Hoff D on: 02/26/2024 09:23 AM   Modules accepted: Orders

## 2024-02-26 NOTE — Addendum Note (Signed)
 Addended by: Linna Hoff D on: 02/26/2024 09:43 AM   Modules accepted: Orders

## 2024-02-26 NOTE — Addendum Note (Signed)
 Addended by: Linna Hoff D on: 02/26/2024 09:41 AM   Modules accepted: Orders

## 2024-02-27 LAB — C. TRACHOMATIS/N. GONORRHOEAE RNA
C. trachomatis RNA, TMA: NOT DETECTED
N. gonorrhoeae RNA, TMA: NOT DETECTED

## 2024-02-27 LAB — GC/CHLAMYDIA PROBE, AMP (THROAT)
Chlamydia trachomatis RNA: NOT DETECTED
Neisseria gonorrhoeae RNA: NOT DETECTED

## 2024-03-02 ENCOUNTER — Telehealth: Payer: Self-pay

## 2024-03-02 NOTE — Telephone Encounter (Signed)
 Attempted to contact patient 2x - call couldn't be completed.   Front desk staff can help set up appointments to see pharmacy team.

## 2024-03-02 NOTE — Telephone Encounter (Signed)
-----   Message from Raymondo Band sent at 03/02/2024  2:26 PM EDT ----- This patient is just not trying to help [place] help him  Pharmacy team if you have any other idea to help him take meds I am all ears.   We tried monthly pharmacy visit he is not coming... I don't think there is mother for hire

## 2024-03-02 NOTE — Progress Notes (Signed)
 Yeah - triage can try again to set it up even if it's not always on the same day/time every month!

## 2024-03-02 NOTE — Progress Notes (Signed)
 More hand holding ideas (require him to come to clinic): ship meds to our clinic, fill pill boxes for him every couple weeks with pharmacy, could try blister packaging through [facility] - even then probably won't help him help himself.

## 2024-03-03 LAB — COMPLETE METABOLIC PANEL WITHOUT GFR
AG Ratio: 1.4 (calc) (ref 1.0–2.5)
ALT: 17 U/L (ref 9–46)
AST: 16 U/L (ref 10–35)
Albumin: 3.7 g/dL (ref 3.6–5.1)
Alkaline phosphatase (APISO): 72 U/L (ref 35–144)
BUN: 9 mg/dL (ref 7–25)
CO2: 27 mmol/L (ref 20–32)
Calcium: 9.2 mg/dL (ref 8.6–10.3)
Chloride: 107 mmol/L (ref 98–110)
Creat: 0.98 mg/dL (ref 0.70–1.30)
Globulin: 2.7 g/dL (ref 1.9–3.7)
Glucose, Bld: 72 mg/dL (ref 65–99)
Potassium: 4.1 mmol/L (ref 3.5–5.3)
Sodium: 139 mmol/L (ref 135–146)
Total Bilirubin: 0.4 mg/dL (ref 0.2–1.2)
Total Protein: 6.4 g/dL (ref 6.1–8.1)

## 2024-03-03 LAB — HEPATITIS B DNA, ULTRAQUANTITATIVE, PCR
Hepatitis B DNA: 73500000 [IU]/mL — ABNORMAL HIGH
Hepatitis B virus DNA: 7.87 {Log_IU}/mL — ABNORMAL HIGH

## 2024-03-03 LAB — HEPATITIS B VIRUS CORE/PRECORE
BCP Mutations: NOT DETECTED
HBV PreCore Mutation: NOT DETECTED
Polymerase Mutations: NOT DETECTED

## 2024-03-03 LAB — HIV-1 RNA QUANT-NO REFLEX-BLD
HIV 1 RNA Quant: 4630 {copies}/mL — ABNORMAL HIGH
HIV-1 RNA Quant, Log: 3.67 {Log_copies}/mL — ABNORMAL HIGH

## 2024-03-04 ENCOUNTER — Other Ambulatory Visit: Payer: Self-pay | Admitting: Internal Medicine

## 2024-03-04 DIAGNOSIS — B20 Human immunodeficiency virus [HIV] disease: Secondary | ICD-10-CM

## 2024-03-04 NOTE — Progress Notes (Signed)
 Sounds good Dr. Renold Don - can you all work to schedule him with pharmacy in the next couple weeks? Thanks!

## 2024-03-05 ENCOUNTER — Other Ambulatory Visit: Payer: Self-pay

## 2024-03-05 MED ORDER — TIVICAY 50 MG PO TABS
ORAL_TABLET | Freq: Every day | ORAL | 0 refills | Status: DC
  Filled 2024-03-05: qty 30, fill #0
  Filled 2024-03-09: qty 30, 30d supply, fill #0

## 2024-03-05 MED ORDER — SYMTUZA 800-150-200-10 MG PO TABS
1.0000 | ORAL_TABLET | Freq: Every day | ORAL | 0 refills | Status: DC
  Filled 2024-03-05 – 2024-03-09 (×2): qty 30, 30d supply, fill #0

## 2024-03-09 ENCOUNTER — Other Ambulatory Visit (HOSPITAL_COMMUNITY): Payer: Self-pay

## 2024-03-09 ENCOUNTER — Other Ambulatory Visit: Payer: Self-pay

## 2024-03-09 ENCOUNTER — Other Ambulatory Visit: Payer: Self-pay | Admitting: Pharmacy Technician

## 2024-03-09 NOTE — Progress Notes (Signed)
 Specialty Pharmacy Refill Coordination Note  Juan Harrison is a 53 y.o. male contacted today regarding refills of specialty medication(s) Darun-Cobic-Emtricit-TenofAF (Symtuza); Dolutegravir Sodium (Tivicay)   Patient requested Pickup at [facility] date: 03/10/24   Medication will be filled on 03/10/24.

## 2024-03-10 ENCOUNTER — Other Ambulatory Visit: Payer: Self-pay

## 2024-03-15 NOTE — Progress Notes (Unsigned)
   HPI: AIDENN SKELLENGER is a 53 y.o. male who presents to the [facility] pharmacy clinic for HIV follow-up.  Patient Active Problem List   Diagnosis Date Noted   Poor dentition 07/06/2020   Porokeratosis 05/23/2020   CKD (chronic kidney disease) stage 2, GFR 60-89 ml/min 04/28/2018   Herpes genitalis in men 02/12/2018   Tobacco use 10/13/2017   Anal cancer (HCC) 12/10/2016   Hepatitis B immune 10/29/2016   AIDS (HCC) 12/02/2005    Patient's Medications  New Prescriptions   No medications on file  Previous Medications   ALBUTEROL (VENTOLIN HFA) 108 (90 BASE) MCG/ACT INHALER    Inhale 1-2 puffs into the lungs every 6 (six) hours as needed for wheezing or shortness of breath.   AZITHROMYCIN (ZITHROMAX) 250 MG TABLET    Take 1 tablet (250 mg total) by mouth daily. Take first 2 tablets together, then 1 every day until finished.   DARUNAVIR-COBICISTAT-EMTRICITABINE-TENOFOVIR ALAFENAMIDE (SYMTUZA) 800-150-200-10 MG TABS    TAKE 1 TABLET BY MOUTH DAILY WITH BREAKFAST.   DOLUTEGRAVIR (TIVICAY) 50 MG TABLET    TAKE 1 TABLET (50 MG TOTAL) BY MOUTH DAILY.   IMIQUIMOD (ALDARA) 5 % CREAM    Apply topically 3 (three) times a week.   PROMETHAZINE-DEXTROMETHORPHAN (PROMETHAZINE-DM) 6.25-15 MG/5ML SYRUP    Take 5 mLs by mouth every 8 (eight) hours as needed for cough.  Modified Medications   No medications on file  Discontinued Medications   No medications on file    Labs: Lab Results  Component Value Date   HIV1RNAQUANT 4,630 (H) 02/26/2024   HIV1RNAQUANT Not Detected 01/29/2024   HIV1RNAQUANT Not Detected 12/25/2023   CD4TABS 124 (L) 01/29/2024   CD4TABS 138 (L) 08/08/2022   CD4TABS 125 (L) 08/08/2020    RPR and STI Lab Results  Component Value Date   LABRPR NON-REACTIVE 11/13/2023   LABRPR NON-REACTIVE 05/15/2023   LABRPR NON-REACTIVE 01/16/2023   LABRPR NON-REACTIVE 08/08/2022   LABRPR NON-REACTIVE 01/12/2021   RPRTITER 1:1 02/29/2016    STI Results GC CT  02/29/2016 12:00 AM  Negative  Negative     Hepatitis B Lab Results  Component Value Date   HEPBSAB NEG 02/29/2016   HEPBSAG REACTIVE (A) 11/13/2023   HEPBCAB NON-REACTIVE 08/08/2022   Hepatitis C Lab Results  Component Value Date   HEPCAB NON-REACTIVE 05/15/2023   Hepatitis A Lab Results  Component Value Date   HAV NON REACTIVE 02/29/2016   Lipids: Lab Results  Component Value Date   CHOL 119 03/27/2020   TRIG 52 03/27/2020   HDL 41 03/27/2020   CHOLHDL 2.9 03/27/2020   VLDL 14 02/29/2016   LDLCALC 65 03/27/2020    Current HIV Regimen: Symtuza + tivicay  Assessment: Jayson Michael is a 53 yo male presenting for an HIV follow-up appointment. He remains on symtuza and tivicay for concomitant HIV and hepatitis B with no reported side effects or issues obtaining or taking the medication. Last HIV RNA was 4630 in 01/2024 while it was undetectable before. Last HBV DNA was 73,500,000. Will repeat both today and a CD4 count. Last lipid panel was in 2021, will repeat today.  Immunizations: he is eligible for hepatitis A, meningococal, prevnar 20, and the shingles vaccination today  Plan: ***

## 2024-03-16 ENCOUNTER — Other Ambulatory Visit (HOSPITAL_COMMUNITY): Payer: Self-pay

## 2024-03-16 ENCOUNTER — Other Ambulatory Visit: Payer: Self-pay

## 2024-03-16 ENCOUNTER — Ambulatory Visit (INDEPENDENT_AMBULATORY_CARE_PROVIDER_SITE_OTHER): Admitting: Pharmacist

## 2024-03-16 DIAGNOSIS — Z79899 Other long term (current) drug therapy: Secondary | ICD-10-CM | POA: Diagnosis not present

## 2024-03-16 DIAGNOSIS — B20 Human immunodeficiency virus [HIV] disease: Secondary | ICD-10-CM | POA: Diagnosis not present

## 2024-03-16 DIAGNOSIS — B181 Chronic viral hepatitis B without delta-agent: Secondary | ICD-10-CM

## 2024-03-16 MED ORDER — SYMTUZA 800-150-200-10 MG PO TABS
1.0000 | ORAL_TABLET | Freq: Every day | ORAL | 1 refills | Status: DC
  Filled 2024-03-16 – 2024-03-29 (×2): qty 30, 30d supply, fill #0
  Filled 2024-05-17: qty 30, 30d supply, fill #1

## 2024-03-16 MED ORDER — TIVICAY 50 MG PO TABS
ORAL_TABLET | Freq: Every day | ORAL | 1 refills | Status: DC
  Filled 2024-03-16: qty 30, fill #0
  Filled 2024-03-29: qty 30, 30d supply, fill #0
  Filled 2024-05-17: qty 30, 30d supply, fill #1

## 2024-03-17 LAB — T-HELPER CELLS (CD4) COUNT (NOT AT ARMC)
CD4 % Helper T Cell: 13 % — ABNORMAL LOW (ref 33–65)
CD4 T Cell Abs: 173 /uL — ABNORMAL LOW (ref 400–1790)

## 2024-03-20 LAB — HIV-1 RNA QUANT-NO REFLEX-BLD
HIV 1 RNA Quant: 15700 {copies}/mL — ABNORMAL HIGH
HIV-1 RNA Quant, Log: 4.2 {Log_copies}/mL — ABNORMAL HIGH

## 2024-03-20 LAB — HEPATITIS B DNA, ULTRAQUANTITATIVE, PCR
Hepatitis B DNA: 337000000 [IU]/mL — ABNORMAL HIGH
Hepatitis B virus DNA: 8.53 {Log_IU}/mL — ABNORMAL HIGH

## 2024-03-20 LAB — LIPID PANEL
Cholesterol: 127 mg/dL (ref <200)
HDL: 46 mg/dL (ref >=40)
LDL Cholesterol (Calc): 64 mg/dL
Non-HDL Cholesterol (Calc): 81 mg/dL (ref <130)
Total CHOL/HDL Ratio: 2.8 (calc) (ref <5.0)
Triglycerides: 87 mg/dL (ref <150)

## 2024-03-29 ENCOUNTER — Other Ambulatory Visit (HOSPITAL_COMMUNITY): Payer: Self-pay

## 2024-03-29 NOTE — Progress Notes (Signed)
 Specialty Pharmacy Ongoing Clinical Assessment Note  Juan Harrison is a 53 y.o. male who is being followed by the specialty pharmacy service for RxSp HIV   Patient's specialty medication(s) reviewed today: Darun-Cobic-Emtricit-TenofAF (Symtuza ); Dolutegravir  Sodium (Tivicay )   Missed doses in the last 4 weeks: 2   Patient/Caregiver did not have any additional questions or concerns.   Therapeutic benefit summary: Patient is achieving benefit   Adverse events/side effects summary: No adverse events/side effects   Patient's therapy is appropriate to: Continue    Goals Addressed             This Visit's Progress    Achieve Undetectable HIV Viral Load < 20   Worsening    Patient is not on track and worsening. Patient will work on increased adherence.  Patient's viral load was up to 15,700 copes/mL as of 03/16/24.  He was counseled on the importance of adherence, his providers are aware and monitoring.         Follow up:  3 months  Malachi Screws Specialty Pharmacist

## 2024-03-29 NOTE — Progress Notes (Signed)
 Specialty Pharmacy Refill Coordination Note  Juan Harrison is a 53 y.o. male contacted today regarding refills of specialty medication(s) Darun-Cobic-Emtricit-TenofAF (Symtuza ); Dolutegravir  Sodium (Tivicay )   Patient requested [name] at [facility] [place] date: 04/13/24   Medication will be filled on 04/12/24.

## 2024-03-30 ENCOUNTER — Other Ambulatory Visit: Payer: Self-pay

## 2024-04-12 ENCOUNTER — Other Ambulatory Visit: Payer: Self-pay

## 2024-04-12 NOTE — Progress Notes (Signed)
 The ASCVD Risk score (Arnett DK, et al., 2019) failed to calculate for the following reasons:   The valid total cholesterol range is 130 to 320 mg/dL  Juan Harrison, BSN, RN

## 2024-04-20 ENCOUNTER — Telehealth: Payer: Self-pay

## 2024-04-20 NOTE — Telephone Encounter (Signed)
 Detectable Viral Load Intervention (DVL)  Most recent VL:  HIV 1 RNA Quant  Date Value Ref Range Status  03/16/2024 15,700 (H) NOT DETECTED copies/mL Final  02/26/2024 4,630 (H) NOT DETECTED copies/mL Final  01/29/2024 Not Detected Copies/mL Final    Last Clinic Visit: 03/16/24  Current ART regimen: Tivicay  and Symtuza   Appointment status: patient has future appointment scheduled  Medication last dispensed (per chart review):   Medication Adherence   Not able to assess   Barriers to Care   Not able to assess    Interventions   Called patient to discuss medication adherence and possible barriers to care. Attempted to call, Vm not setup. Reports medication cost/ forgetfulness is barrier. Will send mychart message to check in on patient.  Julien Odor, RMA

## 2024-04-22 ENCOUNTER — Other Ambulatory Visit: Payer: Self-pay

## 2024-04-22 DIAGNOSIS — B181 Chronic viral hepatitis B without delta-agent: Secondary | ICD-10-CM | POA: Diagnosis not present

## 2024-04-22 DIAGNOSIS — B20 Human immunodeficiency virus [HIV] disease: Secondary | ICD-10-CM | POA: Diagnosis not present

## 2024-04-25 LAB — COMPLETE METABOLIC PANEL WITHOUT GFR
AG Ratio: 1.2 (calc) (ref 1.0–2.5)
ALT: 13 U/L (ref 9–46)
AST: 16 U/L (ref 10–35)
Albumin: 3.7 g/dL (ref 3.6–5.1)
Alkaline phosphatase (APISO): 74 U/L (ref 35–144)
BUN: 9 mg/dL (ref 7–25)
CO2: 28 mmol/L (ref 20–32)
Calcium: 8.7 mg/dL (ref 8.6–10.3)
Chloride: 106 mmol/L (ref 98–110)
Creat: 1.02 mg/dL (ref 0.70–1.30)
Globulin: 3.2 g/dL (ref 1.9–3.7)
Glucose, Bld: 83 mg/dL (ref 65–99)
Potassium: 3.9 mmol/L (ref 3.5–5.3)
Sodium: 138 mmol/L (ref 135–146)
Total Bilirubin: 0.4 mg/dL (ref 0.2–1.2)
Total Protein: 6.9 g/dL (ref 6.1–8.1)

## 2024-04-25 LAB — HIV-1 RNA QUANT-NO REFLEX-BLD
HIV 1 RNA Quant: 25900 {copies}/mL — ABNORMAL HIGH
HIV-1 RNA Quant, Log: 4.41 {Log_copies}/mL — ABNORMAL HIGH

## 2024-04-25 LAB — HEPATITIS B DNA, ULTRAQUANTITATIVE, PCR
Hepatitis B DNA: 380000000 [IU]/mL — ABNORMAL HIGH
Hepatitis B virus DNA: 8.58 {Log_IU}/mL — ABNORMAL HIGH

## 2024-04-29 ENCOUNTER — Other Ambulatory Visit: Payer: Self-pay

## 2024-05-12 ENCOUNTER — Other Ambulatory Visit: Payer: Self-pay

## 2024-05-13 ENCOUNTER — Ambulatory Visit: Admitting: Internal Medicine

## 2024-05-17 ENCOUNTER — Other Ambulatory Visit: Payer: Self-pay | Admitting: Pharmacy Technician

## 2024-05-17 ENCOUNTER — Other Ambulatory Visit: Payer: Self-pay

## 2024-05-17 NOTE — Progress Notes (Signed)
 Specialty Pharmacy Refill Coordination Note  Juan Harrison is a 53 y.o. male contacted today regarding refills of specialty medication(s) Darun-Cobic-Emtricit-TenofAF (Symtuza ); Dolutegravir  Sodium (Tivicay )   Patient requested [name] at [facility] at [place] date: 05/25/24   Medication will be filled on 05/24/24.

## 2024-05-18 ENCOUNTER — Telehealth: Payer: Self-pay

## 2024-05-18 NOTE — Telephone Encounter (Signed)
 Detectable Viral Load Intervention (DVL)  Most recent VL:  HIV 1 RNA Quant  Date Value Ref Range Status  04/22/2024 25,900 (H) NOT DETECTED copies/mL Final  03/16/2024 15,700 (H) NOT DETECTED copies/mL Final  02/26/2024 4,630 (H) NOT DETECTED copies/mL Final    Last Clinic Visit: 03/16/24  Current ART regimen: Tivicay  and Symtuza   Appointment status: patient has future appointment scheduled  Medication last dispensed (per chart review):   Medication Adherence   Not able to assess    Barriers to Care  Not able to assess   Interventions   Called patient to discuss medication adherence and possible barriers to care.

## 2024-05-24 ENCOUNTER — Other Ambulatory Visit: Payer: Self-pay

## 2024-05-25 ENCOUNTER — Encounter: Payer: Self-pay | Admitting: Internal Medicine

## 2024-05-25 ENCOUNTER — Other Ambulatory Visit: Payer: Self-pay

## 2024-05-25 ENCOUNTER — Ambulatory Visit (INDEPENDENT_AMBULATORY_CARE_PROVIDER_SITE_OTHER): Admitting: Internal Medicine

## 2024-05-25 VITALS — BP 100/61 | HR 76 | Temp 97.7°F | Ht 75.0 in | Wt 131.0 lb

## 2024-05-25 DIAGNOSIS — I69311 Memory deficit following cerebral infarction: Secondary | ICD-10-CM | POA: Diagnosis not present

## 2024-05-25 DIAGNOSIS — B181 Chronic viral hepatitis B without delta-agent: Secondary | ICD-10-CM

## 2024-05-25 DIAGNOSIS — Z113 Encounter for screening for infections with a predominantly sexual mode of transmission: Secondary | ICD-10-CM

## 2024-05-25 DIAGNOSIS — B20 Human immunodeficiency virus [HIV] disease: Secondary | ICD-10-CM

## 2024-05-25 MED ORDER — TIVICAY 50 MG PO TABS
ORAL_TABLET | Freq: Every day | ORAL | 1 refills | Status: DC
  Filled 2024-06-17: qty 30, 30d supply, fill #0

## 2024-05-25 MED ORDER — SYMTUZA 800-150-200-10 MG PO TABS
1.0000 | ORAL_TABLET | Freq: Every day | ORAL | 1 refills | Status: DC
  Filled 2024-06-17: qty 30, 30d supply, fill #0

## 2024-05-25 NOTE — Progress Notes (Signed)
 Subjective:    Patient ID: Juan Harrison, male  DOB: [birth year], 53 y.o.        MRN: [id number]  Cc - f/u hiv  HPI 52 yo M with hx of HIV/AIDS. Dx ~2007; MSM. ART hx- Darunavir -norvir/truvada/Epzicom bactrim  (Hx of non-compliance).  He was changed to genvoya  05-2016. Switched to Symtuza kathren 04-2020.  History of anal cancer and has been seen by [facility]/Onc/Rad Onc for anal cancer. Finished CTX and XRT 01-2017.   05/2024 id clinic f/u Patient has had multiple visits between me and our pharmacy team to help ensure his compliance and he still has had troubles taking his biktarvy -- hep b/hiv co infection He said he is taking his meds most of the days of the week perhaps missing about 2-3 days a week He said he is taking symtuza  but doesn't recognize the shape of it when I showed him the brochure of medication  He is actually supposed to be on both symtuza  and tivica  We had tried to minimize medication to ensure compliance  His hiv/hep b dna level have not been suggestive of him taking either medications   He said he is working sometimes forgot his meds and would double up the next day.      05/15/23 id clinic f/u Social -- no new sexual partner/encounter since last visit with me He tried to take hiv meds every day and said no missed dose last 4 weeks Tried imiquimod  off and on- perianal wart still there No other concern Reviewed hepatitis b labs and concerning for not controlled/resistance   01/16/23 id clinic f/u He said he has been taking tivicay  and symtuza  every day. Missed yesterday Need to pick up meds today No f/c No rash No concern in his health  Social -- last sexual encounter 11/2022.  11/28/22 id clinic f/u Patient said he missed 2 days of medication symtuza  and will pick up his refill I confirm again as 10/2022 labs showed viral load increasing to very high. I suspect he is not taking  He said he hasn't tried the imiquimod  yet because the price was high;  [facility] was able to get it down to 16 dollar per rx and he'll pick it up later Liver u/s obtained for chronic hep b showed no evidence of hepatoma Hasn't seen dental people since 09/2022 - still have lots of loose teeth; no dental pain  Not sexually the past month; no sx; refuse std testing today  10/16/22 id clinic f/u Missed 1 dose last 4 weeks of symtuza  and tivicay  Complains of at least months of irritating scars around anal area No bleeding there No other concern today  07/30/22 id clinic f/u He appears to be taking symtuza  and tivicay  for the past year He has been well controlled up to early 2021 but life happened. He reports to be in good situation now and able to take medication regularly Missed 2-3 doses the last 4 weeks.   Hx anal cancer documented 2018 Per patient treated by [facility]/onc/rad-onc 01/2021 [name] note --> chemo and radiation 01/2017 No recurrence as of 2021  Hx ckd3 Cr around 1.1-1.2 in 2021-2022  Social: Lives alone in [place] Few cousins and a brother in [place] Own a car and drives Not in a relationship at this time; not really sexually active Not working  On disability Smoke some kind of cigarettes; no other street drugs; etoh on weekend  No other complaint He has a dental appointment coming up at [facility].  Has dental carries and some pain; no fever/headache   HIV 1 RNA Quant (copies/mL)  Date Value  04/22/2024 25,900 (H)  03/16/2024 15,700 (H)  02/26/2024 4,630 (H)   CD4 (no units)  Date Value  12/11/2016 See Separate Report   CD4 T Cell Abs (/uL)  Date Value  03/16/2024 173 (L)  01/29/2024 124 (L)  08/08/2022 138 (L)     Health Maintenance  Topic Date Due   Hepatitis B Vaccines (1 of 3 - 19+ 3-dose series) 10/15/1991   Zoster Vaccines- Shingrix (1 of 2) Never done   Medicare Annual Wellness (AWV)  01/07/2017   Colonoscopy  Never done   Pneumococcal Vaccine 39-3 Years old (2 of 2 - PPSV23, PCV20, or PCV21)  06/23/2018   COVID-19 Vaccine (3 - Pfizer risk series) 03/12/2021   INFLUENZA VACCINE  07/02/2024   DTaP/Tdap/Td (4 - Td or Tdap) 07/14/2031   Hepatitis C Screening  Completed   HIV Screening  Completed   HPV VACCINES  Aged Out   Meningococcal B Vaccine  Aged Out    ROS: All other ros negative    Objective:   Vitals:   05/25/24 1010  BP: 100/61  Pulse: 76  Temp: 97.7 F (36.5 C)  SpO2: 98%      General/constitutional: no distress, pleasant HEENT: Normocephalic, PER, Conj Clear, EOMI, Oropharynx clear -- poor dentition -- several loose teeth Neck supple CV: rrr no mrg Lungs: clear to auscultation, normal respiratory effort Abd: Soft, Nontender Ext: no edema Skin: No Rash Neuro: nonfocal MSK: no peripheral joint swelling/tenderness/warmth; back spines nontender    Labs: Lab Results  Component Value Date   WBC 3.2 (L) 11/13/2023   HGB 14.0 11/13/2023   HCT 41.7 11/13/2023   MCV 87.2 11/13/2023   PLT 139 (L) 11/13/2023   Last metabolic panel Lab Results  Component Value Date   GLUCOSE 83 04/22/2024   NA 138 04/22/2024   K 3.9 04/22/2024   CL 106 04/22/2024   CO2 28 04/22/2024   BUN 9 04/22/2024   CREATININE 1.02 04/22/2024   EGFR 83 12/25/2023   CALCIUM 8.7 04/22/2024   PHOS 2.5 12/02/2018   PROT 6.9 04/22/2024   ALBUMIN 3.7 04/01/2020   BILITOT 0.4 04/22/2024   ALKPHOS 80 04/01/2020   AST 16 04/22/2024   ALT 13 04/22/2024   ANIONGAP 14 04/01/2020   HIV: Lab Results  Component Value Date   HIV1RNAQUANT 25,900 (H) 04/22/2024   Lab Results  Component Value Date   CD4TCELL 13 (L) 03/16/2024   CD4TABS 173 (L) 03/16/2024      Imaging: Reviewed  11/2022 liver u/s 1. Increased liver echogenicity is consistent with hepatic steatosis. 2. Small benign-appearing cysts in the liver.  01/2024 liver u/s 1. Mildly increased hepatic echotexture, most commonly seen with steatosis. Correlation with LFT's is recommended. Multiple complex hepatic  cysts.   2. Prominent common bile duct measuring up to 0.8 cm. No sonographic evidence of choledocholithiasis.      Assessment & Plan:   #hiv #hep b coinfection (e-ag positive) On symtuza  and tivicay  for concern hx off and on meds  12/28 discuss with him viral load rising 10/2022. He reports taking meds. Asked him to make sure he is actually taking symtuza  and tivicay  each day  01/16/23 December labs showed improved but still not controlled viral load. He reports good compliance missing one dose last 4 weeks of symtuza /tivicay . Will repeat lab today and if viral load still >200 will check resistance testing.  Discussed with him coinfection with hep b that he really need to be on meds to prevent getting resistance to hepatitis b as well   Told me today 05/15/23 no missed dose last 4 weeks   11/13/23 id assessment Patient might not have filled his medication regularly; missing days here and there His hep b viral load has been high and we checked hep b genotype/resistance profile 05/2023 --> fortunately no resistance seen yet    05/2023 hepatitis b genotype No precore mutation or polymerase inhibitors resistance  11/2023 hiv genotype [id number]  01/2024 hep B genotype 3tc, ldt, adv, etv resistance not predicted Precore/bcp mutations not detected   6/24/25025 multiple id and pharmacy visits to help encourage compliance. Nothing obvious in terms of why he is not taking medication. Question of memory issue after stroke but he seems to remember fine. He kept saying he is taking his ART symtuza  but labs don't appear to suggest so. He is also supposed to be on tivicay /symtuza   Hcc screening 01/2024 negative  Discuss seriousness of need to take these medications   Lab Results  Component Value Date   HIV1RNAQUANT 25,900 (H) 04/22/2024   Lab Results  Component Value Date   CD4TCELL 13 (L) 03/16/2024   CD4TABS 173 (L) 03/16/2024   Hep b viral load: 04/22/24     03/16/24      02/26/24       01/2024      [address]  12/2023 hep b eAg reactive; eAb nonreactive    -discussed u=u -encourage compliance -continue current HIV medication symtuza /tivicay  -labs today -f/u 6 weeks -defer adding tivicay  (compliance and unclear benefit)    #need for dental care Several loose teeth  -referred several times; 02/26/24 referral made but he no showed    #hx anal cancer #perianal wart Treated 2018 by xrt/cxt No evidence disease of anal cancer as of 2021 f/u with colorectal surgery 10/16/22 perianal wart on exam. Discussed they could go away or get bigger. Tried previous imiquimod  as of 01/2023 05/2023 anal pap no high risk hpv  Using imiquimod  for his anal wart -- not worse  -defer testing today 05/2024 as focusing on hep b/hiv     Other chronic medical problems #ckd Baseline cr 1.1-1.2 #prostate cancer s/p chemoxrt   #hcm -vaccinations Will review -hepatitis Chronic hep b see above Hep c ab negative 08/2022 -std 01/2024 urine gc/chlam negative 11/2023 rpr reactive 2017 titer 1:1 but subsequently nonreactive (11/2023) 05/2024 rpr/tppa reverse screening ordered -cancer screening Will discuss (colorectal and anal cancer) -pcp establishment Discuss he needs to arrange to get a primary care provider

## 2024-05-25 NOTE — Patient Instructions (Signed)
 These are your hiv and hepatitis b medications  Symtuza  Tivicay  (dolutegravir )   They should be the main priority in your life now as without them you will die within the next few years or have serious complications    I have also referred you to geriatric to make sure your memory/cognition is intact    Bring your pill box and all your pills next visit with me in 6 weeks

## 2024-05-25 NOTE — Addendum Note (Signed)
 Addended by: GRETEL TULLY HERO on: 05/25/2024 11:06 AM   Modules accepted: Orders

## 2024-05-26 ENCOUNTER — Other Ambulatory Visit (HOSPITAL_COMMUNITY): Payer: Self-pay

## 2024-05-26 LAB — HEPATITIS B E ANTIBODY: Hep B E Ab: NONREACTIVE

## 2024-05-27 ENCOUNTER — Other Ambulatory Visit: Payer: Self-pay

## 2024-05-27 ENCOUNTER — Other Ambulatory Visit: Payer: Medicare Other

## 2024-05-27 ENCOUNTER — Other Ambulatory Visit (HOSPITAL_COMMUNITY): Payer: Self-pay

## 2024-05-29 LAB — SYPHILIS ANTIBODY CASCADING REFLEX: T. PALLIDUM AB: NEGATIVE

## 2024-05-29 LAB — HIV-1 RNA QUANT-NO REFLEX-BLD
HIV 1 RNA Quant: 244 {copies}/mL — ABNORMAL HIGH
HIV-1 RNA Quant, Log: 2.39 {Log_copies}/mL — ABNORMAL HIGH

## 2024-05-29 LAB — HEPATITIS B SURFACE ANTIGEN: Hepatitis B Surface Ag: REACTIVE — AB

## 2024-05-29 LAB — HEPATITIS B DNA, ULTRAQUANTITATIVE, PCR
Hepatitis B DNA: 6880000 [IU]/mL — ABNORMAL HIGH
Hepatitis B virus DNA: 6.84 {Log_IU}/mL — ABNORMAL HIGH

## 2024-05-29 LAB — HEPATITIS B E ANTIGEN: Hep B E Ag: REACTIVE — AB

## 2024-06-14 ENCOUNTER — Other Ambulatory Visit: Payer: Self-pay

## 2024-06-14 NOTE — Progress Notes (Signed)
 Specialty Pharmacy Ongoing Clinical Assessment Note  Juan Harrison is a 53 y.o. male who is being followed by the specialty pharmacy service for RxSp HIV   Patient's specialty medication(s) reviewed today: Darun-Cobic-Emtricit-TenofAF (Symtuza ); Dolutegravir  Sodium (Tivicay )   Missed doses in the last 4 weeks: 0   Patient/Caregiver asked additional questions regarding his next appointment time and date. I provided the information to patient.  Therapeutic benefit summary: Patient is achieving benefit   Adverse events/side effects summary: No adverse events/side effects   Patient's therapy is appropriate to: Continue    Goals Addressed             This Visit's Progress    Achieve Undetectable HIV Viral Load < 20   Improving    Patient is not on track and improving. Patient will work on increased adherence.  Patient's viral load was down to 244 copies/mL as of 05/25/24.      Comply with lab assessments   On track    Patient is on track. Patient will adhere to provider and/or lab appointments. Patient asked to confirm next appointment date when I spoke with him today.      Increase CD4 count until steady state   On track    Patient is on track. Patient will work on increased adherence      Maintain optimal adherence to therapy   On track    Patient is on track. Patient will work on increased adherence         Follow up: 3 weeks  Advertising account planner

## 2024-06-15 ENCOUNTER — Other Ambulatory Visit (HOSPITAL_COMMUNITY): Payer: Self-pay

## 2024-06-17 ENCOUNTER — Other Ambulatory Visit: Payer: Self-pay

## 2024-06-17 ENCOUNTER — Other Ambulatory Visit: Payer: Self-pay | Admitting: Pharmacy Technician

## 2024-06-17 NOTE — Progress Notes (Signed)
 Specialty Pharmacy Refill Coordination Note  Juan Harrison is a 53 y.o. male contacted today regarding refills of specialty medication(s) Darun-Cobic-Emtricit-TenofAF (Symtuza ); Dolutegravir  Sodium (Tivicay )   Patient requested Delivery   Pickup date: 06/25/24   Medication will be filled on 06/25/24.

## 2024-06-21 ENCOUNTER — Other Ambulatory Visit: Payer: Self-pay

## 2024-06-21 DIAGNOSIS — B20 Human immunodeficiency virus [HIV] disease: Secondary | ICD-10-CM

## 2024-06-21 DIAGNOSIS — B181 Chronic viral hepatitis B without delta-agent: Secondary | ICD-10-CM

## 2024-06-22 ENCOUNTER — Telehealth: Payer: Self-pay

## 2024-06-22 NOTE — Telephone Encounter (Signed)
Thanks Cece

## 2024-06-22 NOTE — Telephone Encounter (Signed)
 Detectable Viral Load Intervention (DVL)  Most recent VL:  HIV 1 RNA Quant  Date Value Ref Range Status  05/25/2024 244 (H) NOT DETECTED copies/mL Final  04/22/2024 25,900 (H) NOT DETECTED copies/mL Final  03/16/2024 15,700 (H) NOT DETECTED copies/mL Final    Last Clinic Visit: 05/25/24  Current ART regimen: Symtuza   Appointment status: patient has future appointment scheduled  Medication last dispensed (per chart review):   Medication Adherence   What pharmacy do you use for your ART? [facility]   Do you pick up your medication at the pharmacy or is it mailed to you? other  How often do you miss a dose your ART? never or almost never  Are you experiencing any side effects with your ART? no  Are you having any trouble remembering what medication(s) you are supposed to take or how you are supposed to take them? no  What helps you remember to take your medication(s)?    Barriers to Care     Interventions   Called patient to discuss medication adherence and possible barriers to care. Pt reports good adherence to meds. Denies missing any meds in the last 22 days. Lorenda CHRISTELLA Code, RMA

## 2024-06-23 ENCOUNTER — Other Ambulatory Visit: Payer: Self-pay

## 2024-06-24 ENCOUNTER — Other Ambulatory Visit: Payer: Self-pay

## 2024-06-24 DIAGNOSIS — B181 Chronic viral hepatitis B without delta-agent: Secondary | ICD-10-CM

## 2024-06-24 DIAGNOSIS — B20 Human immunodeficiency virus [HIV] disease: Secondary | ICD-10-CM | POA: Diagnosis not present

## 2024-06-25 ENCOUNTER — Other Ambulatory Visit: Payer: Self-pay

## 2024-06-25 LAB — T-HELPER CELL (CD4) - (RCID CLINIC ONLY)
CD4 % Helper T Cell: 16 % — ABNORMAL LOW (ref 33–65)
CD4 T Cell Abs: 146 /uL — ABNORMAL LOW (ref 400–1790)

## 2024-06-27 LAB — HIV-1 RNA QUANT-NO REFLEX-BLD
HIV 1 RNA Quant: <20 {copies}/mL — AB
HIV-1 RNA Quant, Log: <1.3 {Log_copies}/mL — AB

## 2024-06-27 LAB — COMPLETE METABOLIC PANEL WITHOUT GFR
AG Ratio: 1.1 (calc) (ref 1.0–2.5)
ALT: 16 U/L (ref 9–46)
AST: 15 U/L (ref 10–35)
Albumin: 3.3 g/dL — ABNORMAL LOW (ref 3.6–5.1)
Alkaline phosphatase (APISO): 64 U/L (ref 35–144)
BUN: 10 mg/dL (ref 7–25)
CO2: 25 mmol/L (ref 20–32)
Calcium: 8.4 mg/dL — ABNORMAL LOW (ref 8.6–10.3)
Chloride: 107 mmol/L (ref 98–110)
Creat: 1.09 mg/dL (ref 0.70–1.30)
Globulin: 3.1 g/dL (ref 1.9–3.7)
Glucose, Bld: 132 mg/dL — ABNORMAL HIGH (ref 65–99)
Potassium: 3.8 mmol/L (ref 3.5–5.3)
Sodium: 138 mmol/L (ref 135–146)
Total Bilirubin: 0.4 mg/dL (ref 0.2–1.2)
Total Protein: 6.4 g/dL (ref 6.1–8.1)

## 2024-06-27 LAB — HEPATITIS B DNA, ULTRAQUANTITATIVE, PCR
Hepatitis B DNA: 434000 [IU]/mL — ABNORMAL HIGH
Hepatitis B virus DNA: 5.64 {Log_IU}/mL — ABNORMAL HIGH

## 2024-06-30 ENCOUNTER — Ambulatory Visit: Payer: Self-pay | Admitting: Internal Medicine

## 2024-07-05 NOTE — Progress Notes (Unsigned)
 HPI: Juan Harrison is a 53 y.o. male who presents to the [facility] pharmacy clinic for HIV follow-up.  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   AZITHROMYCIN  (ZITHROMAX ) 250 MG TABLET    Take 1 tablet (250 mg total) by mouth daily. Take first 2 tablets together, then 1 every day until finished.   DARUNAVIR -COBICISTAT-EMTRICITABINE-TENOFOVIR ALAFENAMIDE (SYMTUZA ) 800-150-200-10 MG TABS    TAKE 1 TABLET BY MOUTH DAILY WITH BREAKFAST.   DOLUTEGRAVIR  (TIVICAY ) 50 MG TABLET    TAKE 1 TABLET (50 MG TOTAL) BY MOUTH DAILY.   IMIQUIMOD  (ALDARA ) 5 % CREAM    Apply topically 3 (three) times a week.   PROMETHAZINE -DEXTROMETHORPHAN (PROMETHAZINE -DM) 6.25-15 MG/5ML SYRUP    Take 5 mLs by mouth every 8 (eight) hours as needed for cough.  Modified Medications   No medications on file  Discontinued Medications   No medications on file    Labs: Lab Results  Component Value Date   HIV1RNAQUANT <20 DETECTED (A) 06/24/2024   HIV1RNAQUANT 244 (H) 05/25/2024   HIV1RNAQUANT 25,900 (H) 04/22/2024   CD4TABS 146 (L) 06/24/2024   CD4TABS 173 (L) 03/16/2024   CD4TABS 124 (L) 01/29/2024    RPR and STI Lab Results  Component Value Date   LABRPR NON-REACTIVE 11/13/2023   LABRPR NON-REACTIVE 05/15/2023   LABRPR NON-REACTIVE 01/16/2023   LABRPR NON-REACTIVE 08/08/2022   LABRPR NON-REACTIVE 01/12/2021   RPRTITER 1:1 02/29/2016    STI Results GC CT  02/29/2016 12:00 AM  Negative  Negative     Hepatitis B Lab Results  Component Value Date   HEPBSAB NEG 02/29/2016   HEPBSAG REACTIVE (A) 05/25/2024   HEPBCAB NON-REACTIVE 08/08/2022   Hepatitis C Lab Results  Component Value Date   HEPCAB NON-REACTIVE 05/15/2023   Hepatitis A Lab Results  Component Value Date   HAV NON REACTIVE 02/29/2016   Lipids: Lab Results  Component Value Date   CHOL 127 03/16/2024   TRIG 87 03/16/2024   HDL 46 03/16/2024   CHOLHDL 2.8 03/16/2024   VLDL 14 02/29/2016   LDLCALC 64 03/16/2024    Current HIV Regimen: Symtuza  + Tivicay   Assessment: Juan Harrison is here today to follow up for his HIV and Hepatitis B co-infection. He is currently prescribed Symtuza  and Tivicay  to manage both of these. He has had a hard time with adherence in the past and has admitted to missing 2-3 days per week of his medications. He sometimes forgets when he is at work. He last saw Dr. Overton in June and did not recognize his medications when shown on the medication chart we have in clinic. Despite this, his HIV RNA was undetectable when it was checked on 06/24/24. His Hepatitis B DNA went down from 6.8 million to 434,000. He last received his medications from [facility] on 06/29/24.   Today, he states that he has been doing a lot better with taking his medications  and hasn't missed any in the last few weeks. He is feeling better except for fatigue due to his work schedule and the weather. He takes both medications together every day at 5pm. Discussed his most recent lab work and congratulated him on his improved numbers. Asked him to continue taking it every day. Will repeat labs again today to see if Hep B DNA has decreased any more since he has been consistently taking it. Will have him see Dr. Overton in 4-6 weeks for close follow up. Will also refill his medications and send to [facility].   Plan: - Continue Symtuza  and Tivicay  - Hep B DNA, CMP, and HIV RNA today -  Refill Symtuza  and Tivicay  - Follow up with Dr. Overton on 08/19/24  Toria Monte L. Acacia Latorre, PharmD, BCIDP, AAHIVP, CPP Clinical Pharmacist Practitioner - Infectious Diseases Clinical Pharmacist Lead - Specialty Pharmacy [facility]

## 2024-07-06 ENCOUNTER — Ambulatory Visit (INDEPENDENT_AMBULATORY_CARE_PROVIDER_SITE_OTHER): Admitting: Pharmacist

## 2024-07-06 ENCOUNTER — Other Ambulatory Visit: Payer: Self-pay

## 2024-07-06 DIAGNOSIS — B20 Human immunodeficiency virus [HIV] disease: Secondary | ICD-10-CM

## 2024-07-06 DIAGNOSIS — B181 Chronic viral hepatitis B without delta-agent: Secondary | ICD-10-CM

## 2024-07-06 MED ORDER — SYMTUZA 800-150-200-10 MG PO TABS
1.0000 | ORAL_TABLET | Freq: Every day | ORAL | 5 refills | Status: DC
  Filled 2024-07-06 – 2024-07-22 (×2): qty 30, 30d supply, fill #0
  Filled 2024-08-30: qty 30, 30d supply, fill #1
  Filled 2024-10-01: qty 30, 30d supply, fill #2
  Filled 2024-11-03: qty 30, 30d supply, fill #3
  Filled 2024-12-08: qty 30, 30d supply, fill #4

## 2024-07-06 MED ORDER — TIVICAY 50 MG PO TABS
50.0000 mg | ORAL_TABLET | Freq: Every day | ORAL | 5 refills | Status: AC
  Filled 2024-07-06 – 2024-07-22 (×2): qty 30, 30d supply, fill #0
  Filled 2024-08-30: qty 30, 30d supply, fill #1
  Filled 2024-10-01: qty 30, 30d supply, fill #2
  Filled 2024-11-03: qty 30, 30d supply, fill #3
  Filled 2024-12-08: qty 30, 30d supply, fill #4

## 2024-07-09 ENCOUNTER — Ambulatory Visit: Payer: Self-pay | Admitting: Pharmacist

## 2024-07-09 LAB — HIV-1 RNA QUANT-NO REFLEX-BLD
HIV 1 RNA Quant: 68 {copies}/mL — ABNORMAL HIGH
HIV-1 RNA Quant, Log: 1.83 {Log_copies}/mL — ABNORMAL HIGH

## 2024-07-09 LAB — COMPREHENSIVE METABOLIC PANEL WITH GFR
AG Ratio: 1.2 (calc) (ref 1.0–2.5)
ALT: 15 U/L (ref 9–46)
AST: 17 U/L (ref 10–35)
Albumin: 3.8 g/dL (ref 3.6–5.1)
Alkaline phosphatase (APISO): 68 U/L (ref 35–144)
BUN/Creatinine Ratio: 9 (calc) (ref 6–22)
BUN: 15 mg/dL (ref 7–25)
CO2: 28 mmol/L (ref 20–32)
Calcium: 8.9 mg/dL (ref 8.6–10.3)
Chloride: 105 mmol/L (ref 98–110)
Creat: 1.63 mg/dL — ABNORMAL HIGH (ref 0.70–1.30)
Globulin: 3.2 g/dL (ref 1.9–3.7)
Glucose, Bld: 66 mg/dL (ref 65–99)
Potassium: 3.9 mmol/L (ref 3.5–5.3)
Sodium: 140 mmol/L (ref 135–146)
Total Bilirubin: 0.5 mg/dL (ref 0.2–1.2)
Total Protein: 7 g/dL (ref 6.1–8.1)
eGFR: 51 mL/min/1.73m2 — ABNORMAL LOW (ref >=60)

## 2024-07-09 LAB — HEPATITIS B DNA, ULTRAQUANTITATIVE, PCR
Hepatitis B DNA: 263000 [IU]/mL — ABNORMAL HIGH
Hepatitis B virus DNA: 5.42 {Log_IU}/mL — ABNORMAL HIGH

## 2024-07-09 NOTE — Progress Notes (Signed)
 Wanted you to look at SCr too... he was tired and possibly dehydrated when I saw him but wanted you to be aware.

## 2024-07-09 NOTE — Progress Notes (Signed)
 Still looking decent... at least the Hep B is going down!

## 2024-07-14 ENCOUNTER — Other Ambulatory Visit: Payer: Self-pay | Admitting: Pharmacist

## 2024-07-14 DIAGNOSIS — B20 Human immunodeficiency virus [HIV] disease: Secondary | ICD-10-CM

## 2024-07-14 NOTE — Progress Notes (Signed)
 Hi ladies - can one of you please schedule patient for a lab visit this week or early next week? Thank you!

## 2024-07-19 ENCOUNTER — Other Ambulatory Visit: Payer: Self-pay

## 2024-07-20 ENCOUNTER — Ambulatory Visit

## 2024-07-22 ENCOUNTER — Other Ambulatory Visit

## 2024-07-22 ENCOUNTER — Other Ambulatory Visit: Payer: Self-pay

## 2024-07-22 ENCOUNTER — Other Ambulatory Visit: Payer: Self-pay | Admitting: Pharmacy Technician

## 2024-07-22 DIAGNOSIS — B20 Human immunodeficiency virus [HIV] disease: Secondary | ICD-10-CM

## 2024-07-22 LAB — BASIC METABOLIC PANEL WITH GFR
BUN/Creatinine Ratio: 7 (calc) (ref 6–22)
BUN: 9 mg/dL (ref 7–25)
CO2: 26 mmol/L (ref 20–32)
Calcium: 8.9 mg/dL (ref 8.6–10.3)
Chloride: 107 mmol/L (ref 98–110)
Creat: 1.36 mg/dL — ABNORMAL HIGH (ref 0.70–1.30)
Glucose, Bld: 73 mg/dL (ref 65–99)
Potassium: 4 mmol/L (ref 3.5–5.3)
Sodium: 139 mmol/L (ref 135–146)
eGFR: 63 mL/min/1.73m2 (ref >=60)

## 2024-07-22 NOTE — Progress Notes (Signed)
 Specialty Pharmacy Refill Coordination Note  Juan Harrison is a 53 y.o. male contacted today regarding refills of specialty medication(s) Darun-Cobic-Emtricit-TenofAF (Symtuza ); Dolutegravir  Sodium (Tivicay )   Patient requested [name] at [facility] at [place] date: 07/29/24   Medication will be filled on 07/29/24.

## 2024-07-23 ENCOUNTER — Ambulatory Visit: Payer: Self-pay | Admitting: Pharmacist

## 2024-07-23 NOTE — Progress Notes (Signed)
 SCr is a bit better. Would recommend rechecking at his appointment with you on 9/18 unless you want to recheck again before that.

## 2024-07-26 NOTE — Progress Notes (Signed)
 Thanks

## 2024-08-19 ENCOUNTER — Ambulatory Visit: Admitting: Internal Medicine

## 2024-08-19 ENCOUNTER — Other Ambulatory Visit: Payer: Self-pay

## 2024-08-19 ENCOUNTER — Encounter: Payer: Self-pay | Admitting: Internal Medicine

## 2024-08-19 VITALS — BP 112/71 | HR 63 | Temp 98.2°F | Ht 75.0 in | Wt 134.0 lb

## 2024-08-19 DIAGNOSIS — B181 Chronic viral hepatitis B without delta-agent: Secondary | ICD-10-CM

## 2024-08-19 DIAGNOSIS — Z85048 Personal history of other malignant neoplasm of rectum, rectosigmoid junction, and anus: Secondary | ICD-10-CM | POA: Diagnosis not present

## 2024-08-19 DIAGNOSIS — Z79899 Other long term (current) drug therapy: Secondary | ICD-10-CM

## 2024-08-19 DIAGNOSIS — B20 Human immunodeficiency virus [HIV] disease: Secondary | ICD-10-CM

## 2024-08-19 NOTE — Patient Instructions (Addendum)
 I have referred you back to [name] and [name] for anal cancer monitoring    Continue to take your tivicay  and symtuza  daily for hepatitis b and hiv   I have also placed a special liver imaging to evaluate for cirrhosis and liver cancer    See me again in 6 weeks    Blood tests today    Smoking Cessation: QuitlineNC 1-800-QUIT-NOW (415)278-0749); Espaol: 1-855-Djelo-Ya (1-(219)181-1945) [email or web address].[email or web address]

## 2024-08-19 NOTE — Progress Notes (Signed)
 Subjective:    Patient ID: Juan Harrison, male  DOB: 03-21-1971, 53 y.o.        MRN: [id number]  Cc - f/u hiv  HPI 53 yo M with hx of HIV/AIDS. Dx ~2007; MSM. ART hx- Darunavir -norvir/truvada/Epzicom bactrim  (Hx of non-compliance).  He was changed to genvoya  05-2016. Switched to Symtuza kathren 04-2020.  History of anal cancer and has been seen by [facility]/Onc/Rad Onc for anal cancer. Finished CTX and XRT 01-2017.    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 I will need to refer them to [name] again and will forward chart to [name] Other issue -- pjp prophylaxis but we had decided to optimize compliance with ART so haven't pushed hard on bactrim  Patient is sexually active -- he doesn't want std testing. He is only doing oral sex. He is open to doing std screening next visit    05/2024 id clinic f/u Patient has had multiple visits between me and our pharmacy team to help ensure his compliance and he still has had troubles taking his biktarvy -- hep b/hiv co infection He said he is taking his meds most of the days of the week perhaps missing about 2-3 days a week He said he is taking symtuza  but doesn't recognize the shape of it when I showed him the brochure of medication  He is actually supposed to be on both symtuza  and tivica  We had tried to minimize medication to ensure compliance  His hiv/hep b dna level have not been suggestive of him taking either medications   He said he is working sometimes forgot his meds and would double up the next day.      05/15/23 id clinic f/u Social -- no new sexual  partner/encounter since last visit with me He tried to take hiv meds every day and said no missed dose last 4 weeks Tried imiquimod  off and on- perianal wart still there No other concern Reviewed hepatitis b labs and concerning for not controlled/resistance   01/16/23 id clinic f/u He said he has been taking tivicay  and symtuza  every day. Missed yesterday Need to pick up meds today No f/c No rash No concern in his health  Social -- last sexual encounter 11/2022.  11/28/22 id clinic f/u Patient said he missed 2 days of medication symtuza  and will pick up his refill I confirm again as 10/2022 labs showed viral load increasing to very high. I suspect he is not taking  He said he hasn't tried the imiquimod  yet because the price was high; [facility] was able to get it down to 16 dollar per rx and he'll pick it up later Liver u/s obtained for chronic hep b showed no evidence of hepatoma Hasn't seen dental people since 09/2022 - still have lots of loose teeth; no dental pain  Not sexually the past month; no sx; refuse std testing today  10/16/22 id clinic f/u Missed 1 dose last 4 weeks of symtuza  and tivicay  Complains of at least months of irritating scars around anal area No bleeding there No other concern today  07/30/22 id  clinic f/u He appears to be taking symtuza  and tivicay  for the past year He has been well controlled up to early 2021 but life happened. He reports to be in good situation now and able to take medication regularly Missed 2-3 doses the last 4 weeks.   Hx anal cancer documented 2018 Per patient treated by [facility]/onc/rad-onc 01/2021 [name] note --> chemo and radiation 01/2017 No recurrence as of 2021  Hx ckd3 Cr around 1.1-1.2 in 2021-2022  Social: Lives alone in [place] Few cousins and a brother in [place] Own a car and drives Not in a relationship at this time; not really sexually active Not working  On disability Smoke some kind of cigarettes; no  other street drugs; etoh on weekend  No other complaint He has a [facility] appointment [facility]. Has dental carries and some pain; no fever/headache   HIV 1 RNA Quant (copies/mL)  Date Value  07/06/2024 68 (H)  06/24/2024 <20 DETECTED (A)  05/25/2024 244 (H)   CD4 (no units)  Date Value  12/11/2016 See Separate Report   CD4 T Cell Abs (/uL)  Date Value  06/24/2024 146 (L)  03/16/2024 173 (L)  01/29/2024 124 (L)     Health Maintenance  Topic Date Due   Hepatitis B Vaccines 19-59 Average Risk (1 of 3 - 19+ 3-dose series) 10/15/1991   Zoster Vaccines- Shingrix (1 of 2) Never done   Medicare Annual Wellness (AWV)  01/07/2017   Colonoscopy  Never done   Pneumococcal Vaccine: 50+ Years (2 of 2 - PPSV23, PCV20, or PCV21) 06/23/2018   COVID-19 Vaccine (3 - Pfizer risk series) 03/12/2021   Influenza Vaccine  03/01/2025 (Originally 07/02/2024)   DTaP/Tdap/Td (4 - Td or Tdap) 07/14/2031   Hepatitis C Screening  Completed   HIV Screening  Completed   HPV VACCINES  Aged Out   Meningococcal B Vaccine  Aged Out    ROS: All other ros negative    Objective:   Vitals:   08/19/24 0900  BP: 112/71  Pulse: 63  Temp: 98.2 F (36.8 C)  SpO2: 100%      General/constitutional: no distress, pleasant HEENT: Normocephalic, PER, Conj Clear, EOMI, Oropharynx clear -- poor dentition -- several loose teeth Neck supple CV: rrr no mrg Lungs: clear to auscultation, normal respiratory effort Abd: Soft, Nontender Ext: no edema Skin: No Rash Neuro: nonfocal MSK: no peripheral joint swelling/tenderness/warmth; back spines nontender    Labs: Lab Results  Component Value Date   WBC 3.2 (L) 11/13/2023   HGB 14.0 11/13/2023   HCT 41.7 11/13/2023   MCV 87.2 11/13/2023   PLT 139 (L) 11/13/2023   Last metabolic panel Lab Results  Component Value Date   GLUCOSE 73 07/22/2024   NA 139 07/22/2024   K 4.0 07/22/2024   CL 107 07/22/2024   CO2 26 07/22/2024   BUN 9  07/22/2024   CREATININE 1.36 (H) 07/22/2024   EGFR 63 07/22/2024   CALCIUM 8.9 07/22/2024   PHOS 2.5 12/02/2018   PROT 7.0 07/06/2024   ALBUMIN 3.7 04/01/2020   BILITOT 0.5 07/06/2024   ALKPHOS 80 04/01/2020   AST 17 07/06/2024   ALT 15 07/06/2024   ANIONGAP 14 04/01/2020   HIV: Lab Results  Component Value Date   HIV1RNAQUANT 68 (H) 07/06/2024   Lab Results  Component Value Date   CD4TCELL 16 (L) 06/24/2024   CD4TABS 146 (L) 06/24/2024      Imaging: Reviewed  11/2022 liver u/s 1.  Increased liver echogenicity is consistent with hepatic steatosis. 2. Small benign-appearing cysts in the liver.  01/2024 liver u/s 1. Mildly increased hepatic echotexture, most commonly seen with steatosis. Correlation with LFT's is recommended. Multiple complex hepatic cysts.   2. Prominent common bile duct measuring up to 0.8 cm. No sonographic evidence of choledocholithiasis.      Assessment & Plan:   #hiv #hep b coinfection (e-ag positive) On symtuza  and tivicay  for concern hx off and on meds  12/28 discuss with him viral load rising 10/2022. He reports taking meds. Asked him to make sure he is actually taking symtuza  and tivicay  each day  01/16/23 December labs showed improved but still not controlled viral load. He reports good compliance missing one dose last 4 weeks of symtuza /tivicay . Will repeat lab today and if viral load still >200 will check resistance testing. Discussed with him coinfection with hep b that he really need to be on meds to prevent getting resistance to hepatitis b as well   Told me today 05/15/23 no missed dose last 4 weeks   11/13/23 id assessment Patient might not have filled his medication regularly; missing days here and there His hep b viral load has been high and we checked hep b genotype/resistance profile 05/2023 --> fortunately no resistance seen yet    05/2023 hepatitis b genotype No precore mutation or polymerase inhibitors  resistance  11/2023 hiv genotype [id number]  01/2024 hep B genotype 3tc, ldt, adv, etv resistance not predicted Precore/bcp mutations not detected   6/24/25025 multiple id and pharmacy visits to help encourage compliance. Nothing obvious in terms of why he is not taking medication. Question of memory issue after stroke but he seems to remember fine. He kept saying he is taking his ART symtuza  but labs don't appear to suggest so. He is also supposed to be on tivicay /symtuza   Hcc screening 01/2024 negative  Discuss seriousness of need to take these medications   Lab Results  Component Value Date   HIV1RNAQUANT 68 (H) 07/06/2024   Lab Results  Component Value Date   CD4TCELL 16 (L) 06/24/2024   CD4TABS 146 (L) 06/24/2024   Hep b viral load: 07/06/24     263thousand 04/22/24     03/16/24     02/26/24       01/2024      683thousand  12/2023 hep b eAg reactive; eAb nonreactive  08/19/24 patient compliant with his tivicay /symtuza ; no missed dose last 4 weeks. He has had monthly compliance visit with our pharmacist Cassie who is doing an excellent job getting him back on track. He has recently quit one job, which I think he'll benefit from as more time to pay attention to himself  -discussed u=u -encourage compliance -hep b labs including inr/afp today and elastography/us  evaluate for cirrhosis and biannual hcc screening -continue current HIV medication symtuza /tivicay  -labs today -f/u 6 weeks; if his viral load continues to improve, perhaps we can length time between visit -will continue to hold pjp prophylaxis for now as his cd4 count is improving and he is taking ART regularly     #need for dental care Several loose teeth  -he has dental appointment oct 1st 2025    #hx anal cancer stage 3 #perianal wart Treated 2018 by xrt/cxt No evidence disease of anal cancer as of 2021 f/u with colorectal surgery 10/16/22 perianal wart on exam. Discussed they could go  away or get bigger. Tried previous imiquimod  as of 01/2023 05/2023 anal pap  no high risk hpv  Using imiquimod  for his anal wart -- not worse  -he needs to see [name] for f/u -- will place referral again -he also needs to see [name] -- will forward chart    Other chronic medical problems #ckd Baseline cr 1.1-1.2; last couple months up to 1.3-1.6 #prostate cancer s/p chemoxrt  -continue to monitor. Hep b and hiv both can cause GN   #hcm Defer for this visit 08/19/24  -vaccinations Will review -hepatitis Chronic hep b see above Hep c ab negative 08/2022 -std 01/2024 urine gc/chlam negative 11/2023 rpr reactive 2017 titer 1:1 but subsequently nonreactive (11/2023) 05/2024 rpr/tppa reverse screening ordered -cancer screening Will discuss (colorectal and anal cancer) -pcp establishment Discuss he needs to arrange to get a primary care provider; he doesn't have one yet 08/2024 but for all practical purpose he'll need hiv/hep b care the most now and I can coordinate some other primary care issue for now to help improve compliance           Constance ONEIDA Passer, MD [facility] for Infectious Disease [facility] [phone]  page     08/19/2024, 9:31 AM

## 2024-08-24 LAB — COMPLETE METABOLIC PANEL WITHOUT GFR
AG Ratio: 1.2 (calc) (ref 1.0–2.5)
ALT: 13 U/L (ref 9–46)
AST: 14 U/L (ref 10–35)
Albumin: 3.7 g/dL (ref 3.6–5.1)
Alkaline phosphatase (APISO): 66 U/L (ref 35–144)
BUN: 14 mg/dL (ref 7–25)
CO2: 28 mmol/L (ref 20–32)
Calcium: 8.8 mg/dL (ref 8.6–10.3)
Chloride: 108 mmol/L (ref 98–110)
Creat: 0.94 mg/dL (ref 0.70–1.30)
Globulin: 3 g/dL (ref 1.9–3.7)
Glucose, Bld: 95 mg/dL (ref 65–99)
Potassium: 4.1 mmol/L (ref 3.5–5.3)
Sodium: 141 mmol/L (ref 135–146)
Total Bilirubin: 0.3 mg/dL (ref 0.2–1.2)
Total Protein: 6.7 g/dL (ref 6.1–8.1)

## 2024-08-24 LAB — HEPATITIS B DNA, ULTRAQUANTITATIVE, PCR
Hepatitis B DNA: 381000 [IU]/mL — ABNORMAL HIGH
Hepatitis B virus DNA: 5.58 {Log_IU}/mL — ABNORMAL HIGH

## 2024-08-24 LAB — HIV-1 RNA QUANT-NO REFLEX-BLD
HIV 1 RNA Quant: 31 {copies}/mL — ABNORMAL HIGH
HIV-1 RNA Quant, Log: 1.49 {Log_copies}/mL — ABNORMAL HIGH

## 2024-08-24 LAB — PROTIME-INR
INR: 1
Prothrombin Time: 10.5 s (ref 9.0–11.5)

## 2024-08-24 LAB — AFP TUMOR MARKER: AFP-Tumor Marker: 7 ng/mL — ABNORMAL HIGH (ref <6.1)

## 2024-08-25 ENCOUNTER — Other Ambulatory Visit: Payer: Self-pay

## 2024-08-26 ENCOUNTER — Ambulatory Visit (HOSPITAL_COMMUNITY)
Admission: RE | Admit: 2024-08-26 | Discharge: 2024-08-26 | Disposition: A | Discharge status: Home / Self Care | Source: Ambulatory Visit | Attending: Internal Medicine | Admitting: Internal Medicine

## 2024-08-26 ENCOUNTER — Other Ambulatory Visit: Payer: Self-pay

## 2024-08-26 DIAGNOSIS — R932 Abnormal findings on diagnostic imaging of liver and biliary tract: Secondary | ICD-10-CM | POA: Diagnosis not present

## 2024-08-26 DIAGNOSIS — B181 Chronic viral hepatitis B without delta-agent: Secondary | ICD-10-CM | POA: Diagnosis not present

## 2024-08-26 DIAGNOSIS — K709 Alcoholic liver disease, unspecified: Secondary | ICD-10-CM | POA: Diagnosis not present

## 2024-08-26 DIAGNOSIS — K838 Other specified diseases of biliary tract: Secondary | ICD-10-CM | POA: Diagnosis not present

## 2024-08-26 DIAGNOSIS — K769 Liver disease, unspecified: Secondary | ICD-10-CM | POA: Diagnosis not present

## 2024-08-30 ENCOUNTER — Other Ambulatory Visit: Payer: Self-pay

## 2024-08-31 ENCOUNTER — Other Ambulatory Visit (HOSPITAL_COMMUNITY): Payer: Self-pay

## 2024-08-31 DIAGNOSIS — C218 Malignant neoplasm of overlapping sites of rectum, anus and anal canal: Secondary | ICD-10-CM | POA: Diagnosis not present

## 2024-09-01 ENCOUNTER — Other Ambulatory Visit: Payer: Self-pay

## 2024-09-01 ENCOUNTER — Ambulatory Visit

## 2024-09-01 NOTE — Progress Notes (Signed)
 Specialty Pharmacy Refill Coordination Note  Juan Harrison is a 53 y.o. male contacted today regarding refills of specialty medication(s) Darun-Cobic-Emtricit-TenofAF (Symtuza ); Dolutegravir  Sodium (Tivicay )   Patient requested [name] at [facility] at [place] date: 09/06/24   Medication will be filled on 09/01/24.

## 2024-09-01 NOTE — Progress Notes (Signed)
 Specialty Pharmacy Ongoing Clinical Assessment Note  Juan Harrison is a 53 y.o. male who is being followed by the specialty pharmacy service for RxSp HIV   Patient's specialty medication(s) reviewed today: Darun-Cobic-Emtricit-TenofAF (Symtuza ); Dolutegravir  Sodium (Tivicay )   Missed doses in the last 4 weeks: 0   Patient/Caregiver did not have any additional questions or concerns.   Therapeutic benefit summary: Patient is achieving benefit   Adverse events/side effects summary: No adverse events/side effects   Patient's therapy is appropriate to: Continue    Goals Addressed             This Visit's Progress    Achieve Undetectable HIV Viral Load < 20   On track    Patient is on track. Patient will work on increased adherence.  Patient's viral load was down to 31 copies/mL as of 08/19/24.      Comply with lab assessments   On track    Patient is on track. Patient will adhere to provider and/or lab appointments.       Increase CD4 count until steady state   On track    Patient is on track. Patient will work on increased adherence      Maintain optimal adherence to therapy   On track    Patient is on track. Patient will work on increased adherence         Follow up: 3 months  Advertising account planner

## 2024-09-30 ENCOUNTER — Other Ambulatory Visit: Payer: Self-pay

## 2024-09-30 ENCOUNTER — Ambulatory Visit: Admitting: Internal Medicine

## 2024-09-30 VITALS — BP 96/62 | HR 79 | Temp 98.9°F | Resp 16 | Wt 133.2 lb

## 2024-09-30 DIAGNOSIS — B181 Chronic viral hepatitis B without delta-agent: Secondary | ICD-10-CM | POA: Diagnosis not present

## 2024-09-30 DIAGNOSIS — B20 Human immunodeficiency virus [HIV] disease: Secondary | ICD-10-CM

## 2024-09-30 DIAGNOSIS — B1910 Unspecified viral hepatitis B without hepatic coma: Secondary | ICD-10-CM | POA: Diagnosis not present

## 2024-09-30 NOTE — Progress Notes (Signed)
 Subjective:    Patient ID: Juan Harrison, male  DOB: [birth year], 53 y.o.        MRN: [id number]  Cc - f/u hiv  HPI 53 yo M with hx of HIV/AIDS. Dx ~2007; MSM. ART hx- Darunavir -norvir/truvada/Epzicom bactrim  (Hx of non-compliance).  He was changed to genvoya  05-2016. Switched to Symtuza kathren 04-2020.  History of anal cancer and has been seen by [facility]/Onc/Rad Onc for anal cancer. Finished CTX and XRT 01-2017.    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 I will need to refer them to dr Juan Harrison again and will forward chart to dr Juan Harrison Other issue -- pjp prophylaxis but we had decided to optimize compliance with ART so haven't pushed hard on bactrim  Patient is sexually active -- he doesn't want std testing. He is only doing oral sex. He is open to doing std screening next visit    05/2024 id clinic f/u Patient has had multiple visits between me and our pharmacy team to help ensure his compliance and he still has had troubles taking his biktarvy -- hep b/hiv co infection He said he is taking his meds most of the days of the week perhaps missing about 2-3 days a  week He said he is taking symtuza  but doesn't recognize the shape of it when I showed him the brochure of medication  He is actually supposed to be on both symtuza  and tivica  We had tried to minimize medication to ensure compliance  His hiv/hep b dna level have not been suggestive of him taking either medications   He said he is working sometimes forgot his meds and would double up the next day.      05/15/23 id clinic f/u Social -- no new sexual partner/encounter since last visit with me He tried to take hiv meds every day and said no missed dose last 4 weeks Tried imiquimod  off and on- perianal wart still there No other concern Reviewed hepatitis b labs and concerning for not controlled/resistance   01/16/23 id clinic f/u He said he has been taking tivicay  and symtuza  every day. Missed yesterday Need to pick up meds today No f/c No rash No concern in his health  Social -- last sexual encounter 11/2022.  11/28/22 id clinic f/u Patient said he missed 2 days of medication symtuza  and will pick up his refill I confirm again as 10/2022 labs showed viral load increasing to very high. I suspect he is not taking  He said he hasn't tried the imiquimod  yet because the price was high; [facility] was able to get it down to 16 dollar  per rx and he'll pick it up later Liver u/s obtained for chronic hep b showed no evidence of hepatoma Hasn't seen dental people since 09/2022 - still have lots of loose teeth; no dental pain  Not sexually the past month; no sx; refuse std testing today  10/16/22 id clinic f/u Missed 1 dose last 4 weeks of symtuza  and tivicay  Complains of at least months of irritating scars around anal area No bleeding there No other concern today  07/30/22 id clinic f/u He appears to be taking symtuza  and tivicay  for the past year He has been well controlled up to early 2021 but life happened. He reports to be in good situation now and able to take medication  regularly Missed 2-3 doses the last 4 weeks.   Hx anal cancer documented 2018 Per patient treated by [facility]/onc/rad-onc 01/2021 dr Juan Harrison note --> chemo and radiation 01/2017 No recurrence as of 2021  Hx ckd3 Cr around 1.1-1.2 in 2021-2022  Social: Lives alone in [place] Few cousins and a brother in [place] Own a car and drives Not in a relationship at this time; not really sexually active Not working  On disability Smoke some kind of cigarettes; no other street drugs; etoh on weekend  No other complaint He has a dental appointment [facility]. Has dental carries and some pain; no fever/headache   HIV 1 RNA Quant (copies/mL)  Date Value  08/19/2024 31 (H)  07/06/2024 68 (H)  06/24/2024 <20 DETECTED (A)   CD4 (no units)  Date Value  12/11/2016 See Separate Report   CD4 T Cell Abs (/uL)  Date Value  06/24/2024 146 (L)  03/16/2024 173 (L)  01/29/2024 124 (L)     Health Maintenance  Topic Date Due   Hepatitis B Vaccines 19-59 Average Risk (1 of 3 - 19+ 3-dose series) 10/15/1991   Zoster Vaccines- Shingrix (1 of 2) Never done   Medicare Annual Wellness (AWV)  01/07/2017   Colonoscopy  Never done   Pneumococcal Vaccine: 50+ Years (2 of 2 - PPSV23, PCV20, or PCV21) 06/23/2018   COVID-19 Vaccine (3 - Pfizer risk series) 03/12/2021   Influenza Vaccine  03/01/2025 (Originally 07/02/2024)   DTaP/Tdap/Td (4 - Td or Tdap) 07/14/2031   Hepatitis C Screening  Completed   HIV Screening  Completed   HPV VACCINES  Aged Out   Meningococcal B Vaccine  Aged Out    ROS: All other ros negative    Objective:   Vitals:   09/30/24 0845  BP: 96/62  Pulse: 79  Resp: 16  Temp: 98.9 F (37.2 C)  SpO2: 99%      General/constitutional: no distress, pleasant HEENT: Normocephalic, PER, Conj Clear, EOMI, Oropharynx clear -- poor dentition -- several loose teeth Neck supple CV: rrr no mrg Lungs: clear to auscultation, normal respiratory effort Abd: Soft,  Nontender Ext: no edema Skin: No Rash Neuro: nonfocal MSK: no peripheral joint swelling/tenderness/warmth; back spines nontender    Labs: Lab Results  Component Value Date   WBC 3.2 (L) 11/13/2023   HGB 14.0 11/13/2023   HCT 41.7 11/13/2023   MCV 87.2 11/13/2023   PLT 139 (L) 11/13/2023   Last metabolic panel Lab Results  Component Value Date   GLUCOSE 95 08/19/2024   NA 141 08/19/2024   K 4.1 08/19/2024   CL 108 08/19/2024   CO2 28 08/19/2024   BUN 14 08/19/2024   CREATININE 0.94 08/19/2024   EGFR 63 07/22/2024   CALCIUM 8.8 08/19/2024  PHOS 2.5 12/02/2018   PROT 6.7 08/19/2024   ALBUMIN 3.7 04/01/2020   BILITOT 0.3 08/19/2024   ALKPHOS 80 04/01/2020   AST 14 08/19/2024   ALT 13 08/19/2024   ANIONGAP 14 04/01/2020   HIV: Lab Results  Component Value Date   HIV1RNAQUANT 31 (H) 08/19/2024   Lab Results  Component Value Date   CD4TCELL 16 (L) 06/24/2024   CD4TABS 146 (L) 06/24/2024      Imaging: Reviewed  11/2022 liver u/s 1. Increased liver echogenicity is consistent with hepatic steatosis. 2. Small benign-appearing cysts in the liver.  01/2024 liver u/s 1. Mildly increased hepatic echotexture, most commonly seen with steatosis. Correlation with LFT's is recommended. Multiple complex hepatic cysts.   2. Prominent common bile duct measuring up to 0.8 cm. No sonographic evidence of choledocholithiasis.      Assessment & Plan:   #hiv #hep b coinfection (e-ag positive) On symtuza  and tivicay  for concern hx off and on meds  12/28 discuss with him viral load rising 10/2022. He reports taking meds. Asked him to make sure he is actually taking symtuza  and tivicay  each day  01/16/23 December labs showed improved but still not controlled viral load. He reports good compliance missing one dose last 4 weeks of symtuza /tivicay . Will repeat lab today and if viral load still >200 will check resistance testing. Discussed with him coinfection with hep b that  he really need to be on meds to prevent getting resistance to hepatitis b as well   Told me today 05/15/23 no missed dose last 4 weeks   11/13/23 id assessment Patient might not have filled his medication regularly; missing days here and there His hep b viral load has been high and we checked hep b genotype/resistance profile 05/2023 --> fortunately no resistance seen yet    05/2023 hepatitis b genotype No precore mutation or polymerase inhibitors resistance  11/2023 hiv genotype [id number]  01/2024 hep B genotype 3tc, ldt, adv, etv resistance not predicted Precore/bcp mutations not detected   6/24/25025 multiple id and pharmacy visits to help encourage compliance. Nothing obvious in terms of why he is not taking medication. Question of memory issue after stroke but he seems to remember fine. He kept saying he is taking his ART symtuza  but labs don't appear to suggest so. He is also supposed to be on tivicay /symtuza   Hcc screening 01/2024 negative  Discuss seriousness of need to take these medications   Lab Results  Component Value Date   HIV1RNAQUANT 31 (H) 08/19/2024   Lab Results  Component Value Date   CD4TCELL 16 (L) 06/24/2024   CD4TABS 146 (L) 06/24/2024   Hep b viral load: 07/06/24     263thousand 04/22/24     03/16/24     02/26/24       01/2024      683thousand  12/2023 hep b eAg reactive; eAb nonreactive  08/19/24 patient compliant with his tivicay /symtuza ; no missed dose last 4 weeks. He has had monthly compliance visit with our pharmacist Cassie who is doing an excellent job getting him back on track. He has recently quit one job, which I think he'll benefit from as more time to pay attention to himself   09/30/24 appears to be compliant. Will see how his hiv and hep b viral load is responding   -discussed u=u -encourage compliance -hep b labs including inr/afp today and elastography/us  evaluate for cirrhosis and biannual hcc screening -continue  current HIV medication symtuza /tivicay  -labs today -  f/u 3 months -will continue to hold pjp prophylaxis for now as his cd4 count is improving and he is taking ART regularly     #need for dental care Several loose teeth Seeing dental here at [facility]  -he has dental appointment December 3rd 2025    #hx anal cancer stage 3 #perianal wart Treated 2018 by xrt/cxt No evidence disease of anal cancer as of 2021 f/u with colorectal surgery 10/16/22 perianal wart on exam. Discussed they could go away or get bigger. Tried previous imiquimod  as of 01/2023 05/2023 anal pap no high risk hpv  Using imiquimod  for his anal wart -- not worse  08/31/24 f/u dr Debby -- no concern and will f/u in a year    Other chronic medical problems #ckd Baseline cr 1.1-1.2; 08/2024 cr 0.94 #prostate cancer s/p chemoxrt    #hcm -vaccinations Doesn't want any vaccine today -hepatitis Chronic hep b see above Hep c ab negative 08/2022 -std 01/2024 urine gc/chlam negative 11/2023 rpr reactive 2017 titer 1:1 but subsequently nonreactive (11/2023) Not sexually active defer this visit 09/30/24 -cancer screening Will discuss (colorectal and anal cancer) -pcp establishment Discuss he needs to arrange to get a primary care provider; he doesn't have one yet 08/2024 but for all practical purpose he'll need hiv/hep b care the most now and I can coordinate some other primary care issue for now to help improve compliance           Constance ONEIDA Passer, MD [facility] for Infectious Disease [facility] [phone]  page     09/30/2024, 9:10 AM

## 2024-09-30 NOTE — Patient Instructions (Addendum)
 I am happy for you that you are able to take both the tivicay  and symtuza  regularly for hiv and hepatitis B    Your hepatitis b is getting better but still high in number    Let's repeat lab testing again in 3months    Will need to discuss at some point to start colon cancer and prostate cancer screening as well   Continue symtuza  and tivicay 

## 2024-10-01 ENCOUNTER — Other Ambulatory Visit (HOSPITAL_COMMUNITY): Payer: Self-pay

## 2024-10-05 ENCOUNTER — Other Ambulatory Visit: Payer: Self-pay

## 2024-10-05 NOTE — Progress Notes (Signed)
 Specialty Pharmacy Refill Coordination Note  Juan Harrison is a 53 y.o. male contacted today regarding refills of specialty medication(s) Darun-Cobic-Emtricit-TenofAF (Symtuza ); Dolutegravir  Sodium (Tivicay )   Patient requested [name] at [facility] [place] date: 10/07/24   Medication will be filled on: 10/06/24

## 2024-10-06 LAB — CBC WITH DIFFERENTIAL/PLATELET
Absolute Lymphocytes: 1323 {cells}/uL (ref 850–3900)
Absolute Monocytes: 475 {cells}/uL (ref 200–950)
Basophils Absolute: 49 {cells}/uL (ref 0–200)
Basophils Relative: 1 %
Eosinophils Absolute: 260 {cells}/uL (ref 15–500)
Eosinophils Relative: 5.3 %
HCT: 41.4 % (ref 38.5–50.0)
Hemoglobin: 13.6 g/dL (ref 13.2–17.1)
MCH: 29.6 pg (ref 27.0–33.0)
MCHC: 32.9 g/dL (ref 32.0–36.0)
MCV: 90 fL (ref 80.0–100.0)
MPV: 11.1 fL (ref 7.5–12.5)
Monocytes Relative: 9.7 %
Neutro Abs: 2793 {cells}/uL (ref 1500–7800)
Neutrophils Relative %: 57 %
Platelets: 264 Thousand/uL (ref 140–400)
RBC: 4.6 Million/uL (ref 4.20–5.80)
RDW: 12.4 % (ref 11.0–15.0)
Total Lymphocyte: 27 %
WBC: 4.9 Thousand/uL (ref 3.8–10.8)

## 2024-10-06 LAB — COMPLETE METABOLIC PANEL WITHOUT GFR
AG Ratio: 1.2 (calc) (ref 1.0–2.5)
ALT: 14 U/L (ref 9–46)
AST: 14 U/L (ref 10–35)
Albumin: 3.9 g/dL (ref 3.6–5.1)
Alkaline phosphatase (APISO): 78 U/L (ref 35–144)
BUN: 12 mg/dL (ref 7–25)
CO2: 28 mmol/L (ref 20–32)
Calcium: 9.3 mg/dL (ref 8.6–10.3)
Chloride: 107 mmol/L (ref 98–110)
Creat: 1.22 mg/dL (ref 0.70–1.30)
Globulin: 3.3 g/dL (ref 1.9–3.7)
Glucose, Bld: 64 mg/dL — ABNORMAL LOW (ref 65–99)
Potassium: 4.2 mmol/L (ref 3.5–5.3)
Sodium: 139 mmol/L (ref 135–146)
Total Bilirubin: 0.6 mg/dL (ref 0.2–1.2)
Total Protein: 7.2 g/dL (ref 6.1–8.1)

## 2024-10-06 LAB — HEPATITIS B DNA, ULTRAQUANTITATIVE, PCR
Hepatitis B DNA: 34200 [IU]/mL — ABNORMAL HIGH
Hepatitis B virus DNA: 4.53 {Log_IU}/mL — ABNORMAL HIGH

## 2024-10-06 LAB — HIV-1 RNA QUANT-NO REFLEX-BLD
HIV 1 RNA Quant: 36 {copies}/mL — ABNORMAL HIGH
HIV-1 RNA Quant, Log: 1.56 {Log_copies}/mL — ABNORMAL HIGH

## 2024-10-11 ENCOUNTER — Other Ambulatory Visit (HOSPITAL_COMMUNITY): Payer: Self-pay

## 2024-10-14 ENCOUNTER — Ambulatory Visit: Payer: Self-pay | Admitting: Internal Medicine

## 2024-11-03 ENCOUNTER — Other Ambulatory Visit: Payer: Self-pay

## 2024-11-03 ENCOUNTER — Ambulatory Visit

## 2024-11-05 ENCOUNTER — Other Ambulatory Visit: Payer: Self-pay

## 2024-11-05 NOTE — Progress Notes (Signed)
 Specialty Pharmacy Refill Coordination Note  Juan Harrison is a 53 y.o. male contacted today regarding refills of specialty medication(s) Darun-Cobic-Emtricit-TenofAF (Symtuza ); Dolutegravir  Sodium (Tivicay )   Patient requested [name] at [facility] at [place] date: 11/09/24   Medication will be filled on: 11/08/24

## 2024-11-08 ENCOUNTER — Other Ambulatory Visit: Payer: Self-pay

## 2024-11-15 ENCOUNTER — Other Ambulatory Visit: Payer: Self-pay

## 2024-11-15 ENCOUNTER — Other Ambulatory Visit (HOSPITAL_COMMUNITY): Payer: Self-pay

## 2024-11-16 ENCOUNTER — Other Ambulatory Visit: Payer: Self-pay

## 2024-11-18 ENCOUNTER — Other Ambulatory Visit: Payer: Self-pay

## 2024-11-22 ENCOUNTER — Other Ambulatory Visit: Payer: Self-pay

## 2024-11-22 NOTE — Progress Notes (Signed)
 Specialty Pharmacy Ongoing Clinical Assessment Note  Juan Harrison is a 53 y.o. male who is being followed by the specialty pharmacy service for RxSp HIV   Patient's specialty medication(s) reviewed today: Darun-Cobic-Emtricit-TenofAF (Symtuza ); Dolutegravir  Sodium (Tivicay )   Missed doses in the last 4 weeks: 0   Patient/Caregiver did not have any additional questions or concerns.   Therapeutic benefit summary: Patient is achieving benefit   Adverse events/side effects summary: No adverse events/side effects   Patient's therapy is appropriate to: Continue    Goals Addressed             This Visit's Progress    Achieve Undetectable HIV Viral Load < 20   On track    Patient is on track. Patient will work on increased adherence.  Patient's viral load was down to 36 copies/mL as of 09/30/24.         Follow up: 3 months  Silvano LOISE Dolly Specialty Pharmacist

## 2024-12-08 ENCOUNTER — Other Ambulatory Visit: Payer: Self-pay

## 2024-12-10 ENCOUNTER — Other Ambulatory Visit: Payer: Self-pay

## 2024-12-10 NOTE — Progress Notes (Signed)
 Specialty Pharmacy Refill Coordination Note  Juan Harrison is a 54 y.o. male contacted today regarding refills of specialty medication(s) Darun-Cobic-Emtricit-TenofAF (Symtuza ); Dolutegravir  Sodium (Tivicay )   Patient requested [name] at [facility] [place] date: 12/20/24   Medication will be filled on: 12/17/24

## 2024-12-17 ENCOUNTER — Other Ambulatory Visit: Payer: Self-pay

## 2024-12-21 ENCOUNTER — Other Ambulatory Visit: Payer: Self-pay

## 2024-12-24 ENCOUNTER — Other Ambulatory Visit: Payer: Self-pay

## 2024-12-24 ENCOUNTER — Other Ambulatory Visit (HOSPITAL_COMMUNITY): Payer: Self-pay

## 2024-12-27 ENCOUNTER — Other Ambulatory Visit (HOSPITAL_COMMUNITY): Payer: Self-pay

## 2024-12-28 ENCOUNTER — Other Ambulatory Visit (HOSPITAL_COMMUNITY): Payer: Self-pay

## 2025-01-04 ENCOUNTER — Ambulatory Visit: Admitting: Internal Medicine

## 2025-01-06 ENCOUNTER — Other Ambulatory Visit: Payer: Self-pay

## 2025-01-06 ENCOUNTER — Other Ambulatory Visit (HOSPITAL_COMMUNITY): Payer: Self-pay

## 2025-01-06 ENCOUNTER — Ambulatory Visit: Admitting: Internal Medicine

## 2025-01-06 ENCOUNTER — Encounter: Payer: Self-pay | Admitting: Internal Medicine

## 2025-01-06 VITALS — BP 106/73 | Temp 97.7°F | Resp 16 | Wt 143.0 lb

## 2025-01-06 DIAGNOSIS — B181 Chronic viral hepatitis B without delta-agent: Secondary | ICD-10-CM | POA: Diagnosis not present

## 2025-01-06 DIAGNOSIS — B20 Human immunodeficiency virus [HIV] disease: Secondary | ICD-10-CM | POA: Diagnosis not present

## 2025-01-06 DIAGNOSIS — R21 Rash and other nonspecific skin eruption: Secondary | ICD-10-CM | POA: Diagnosis not present

## 2025-01-06 MED ORDER — DOLUTEGRAVIR SODIUM 50 MG PO TABS: 50.0000 mg | ORAL_TABLET | Freq: Every day | ORAL | 11 refills | Status: AC

## 2025-01-06 MED ORDER — DARUN-COBIC-EMTRICIT-TENOFAF 800-150-200-10 MG PO TABS: 1.0000 | ORAL_TABLET | Freq: Every day | ORAL | 11 refills | Status: AC

## 2025-01-06 NOTE — Addendum Note (Signed)
 Addended by: OVERTON FAITH T on: 01/06/2025 11:39 AM   Modules accepted: Orders

## 2025-01-06 NOTE — Patient Instructions (Addendum)
 Labs today for hep b and hiv   Continue current medications symtuza  and tivicay     See us  again in 3 months   Referral to dermatology placed for rash  Please set up primary care clinic ([facility]) to consider cryo therapy

## 2025-01-06 NOTE — Progress Notes (Addendum)
 "  Subjective:    Patient ID: Juan Harrison, male  DOB: 15-Sep-1971, 54 y.o.        MRN: [id number]  Cc - f/u hiv  HPI 54 yo M with hx of HIV/AIDS. Dx ~2007; MSM. ART hx- Darunavir -norvir/truvada/Epzicom bactrim  (Hx of non-compliance).  He was changed to genvoya  05-2016. Switched to Symtuza kathren 04-2020.  History of anal cancer and has been seen by [facility]/Onc/Rad Onc for anal cancer. Finished CTX and XRT 01-2017.   01/06/25 id clinic f/u Compliant with hiv meds  No complaint Needs dental care f/u also 25 dollar copay hiv meds @ [place]  3 months right corner of mouth nodule - bleeds at times. 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 I will need to refer them to [name] again and will forward chart to [name] Other issue -- pjp prophylaxis but we had decided to optimize compliance with ART so haven't pushed hard on bactrim  Patient is sexually active -- he doesn't want std testing. He is only doing oral sex. He is open to doing std screening next visit    05/2024 id clinic f/u Patient has had multiple visits between me and our pharmacy team  to help ensure his compliance and he still has had troubles taking his biktarvy -- hep b/hiv co infection He said he is taking his meds most of the days of the week perhaps missing about 2-3 days a week He said he is taking symtuza  but doesn't recognize the shape of it when I showed him the brochure of medication  He is actually supposed to be on both symtuza  and tivica  We had tried to minimize medication to ensure compliance  His hiv/hep b dna level have not been suggestive of him taking either medications   He said he is working sometimes forgot his meds and would double up the next day.      05/15/23 id clinic f/u Social -- no new sexual partner/encounter since last visit with me He tried to take hiv meds every day and said no missed dose last 4 weeks Tried imiquimod  off and on- perianal wart still there No other concern Reviewed hepatitis b labs and concerning for not controlled/resistance   01/16/23 id clinic f/u He said he has been taking tivicay  and symtuza  every day. Missed yesterday Need to pick up meds today No f/c No rash No concern in his health  Social -- last sexual encounter 11/2022.  11/28/22 id clinic f/u Patient said he missed 2 days of medication symtuza  and will pick up his refill I confirm again  as 10/2022 labs showed viral load increasing to very high. I suspect he is not taking  He said he hasn't tried the imiquimod  yet because the price was high; [facility] was able to get it down to 16 dollar per rx and he'll pick it up later Liver u/s obtained for chronic hep b showed no evidence of hepatoma Hasn't seen dental people since 09/2022 - still have lots of loose teeth; no dental pain  Not sexually the past month; no sx; refuse std testing today  10/16/22 id clinic f/u Missed 1 dose last 4 weeks of symtuza  and tivicay  Complains of at least months of irritating scars around anal area No bleeding there No other concern today  07/30/22 id clinic f/u He  appears to be taking symtuza  and tivicay  for the past year He has been well controlled up to early 2021 but life happened. He reports to be in good situation now and able to take medication regularly Missed 2-3 doses the last 4 weeks.   Hx anal cancer documented 2018 Per patient treated by [facility]/onc/rad-onc 01/2021 [name] note --> chemo and radiation 01/2017 No recurrence as of 2021  Hx ckd3 Cr around 1.1-1.2 in 2021-2022  Social: Lives alone in [place] Few cousins and a brother in [place] Own a car and drives Not in a relationship at this time; not really sexually active Not working  On disability Smoke some kind of cigarettes; no other street drugs; etoh on weekend  No other complaint He has a dental appointment coming up at [facility]. Has dental carries and some pain; no fever/headache   HIV 1 RNA Quant (copies/mL)  Date Value  09/30/2024 36 (H)  08/19/2024 31 (H)  07/06/2024 68 (H)   CD4 (no units)  Date Value  12/11/2016 See Separate Report   CD4 T Cell Abs (/uL)  Date Value  06/24/2024 146 (L)  03/16/2024 173 (L)  01/29/2024 124 (L)     Health Maintenance  Topic Date Due   Hepatitis B Vaccines 19-59 Average Risk (1 of 3 - 19+ 3-dose series) 10/15/1991   Zoster Vaccines- Shingrix (1 of 2) Never done   Medicare Annual Wellness (AWV)  01/07/2017   Colonoscopy  Never done   Pneumococcal Vaccine: 50+ Years (2 of 2 - PPSV23, PCV20, or PCV21) 06/23/2018   COVID-19 Vaccine (3 - Pfizer risk series) 03/12/2021   Influenza Vaccine  03/01/2025 (Originally 07/02/2024)   DTaP/Tdap/Td (4 - Td or Tdap) 07/14/2031   HPV VACCINES (No Doses Required) Completed   Hepatitis C Screening  Completed   HIV Screening  Completed   Meningococcal B Vaccine  Aged Out    ROS: All other ros negative    Objective:   Vitals:   01/06/25 1104  Resp: 16      General/constitutional: no distress, pleasant HEENT: Normocephalic, PER, Conj Clear, EOMI, Oropharynx clear  -- poor dentition -- several loose teeth Neck supple CV: rrr no mrg Lungs: clear to auscultation, normal respiratory effort Abd: Soft, Nontender Ext: no edema Skin: verucious 0.75 cm nodule right corner of mouth Neuro: nonfocal MSK: no peripheral joint swelling/tenderness/warmth; back spines nontender    Labs: Lab Results  Component Value Date   WBC 4.9 09/30/2024   HGB 13.6 09/30/2024   HCT 41.4 09/30/2024   MCV 90.0 09/30/2024   PLT 264 09/30/2024   Last metabolic panel Lab Results  Component Value Date   GLUCOSE 64 (L) 09/30/2024   NA 139 09/30/2024   K 4.2 09/30/2024  CL 107 09/30/2024   CO2 28 09/30/2024   BUN 12 09/30/2024   CREATININE 1.22 09/30/2024   EGFR 63 07/22/2024   CALCIUM 9.3 09/30/2024   PHOS 2.5 12/02/2018   PROT 7.2 09/30/2024   ALBUMIN 3.7 04/01/2020   BILITOT 0.6 09/30/2024   ALKPHOS 80 04/01/2020   AST 14 09/30/2024   ALT 14 09/30/2024   ANIONGAP 14 04/01/2020   HIV: Lab Results  Component Value Date   HIV1RNAQUANT 36 (H) 09/30/2024   Lab Results  Component Value Date   CD4TCELL 16 (L) 06/24/2024   CD4TABS 146 (L) 06/24/2024      Imaging: Reviewed  11/2022 liver u/s 1. Increased liver echogenicity is consistent with hepatic steatosis. 2. Small benign-appearing cysts in the liver.  01/2024 liver u/s 1. Mildly increased hepatic echotexture, most commonly seen with steatosis. Correlation with LFT's is recommended. Multiple complex hepatic cysts.   2. Prominent common bile duct measuring up to 0.8 cm. No sonographic evidence of choledocholithiasis.  08/2024 us  elastography IMPRESSION: ULTRASOUND ABDOMEN:   1. Dilated intra and extrahepatic bile ducts without visualization of obstructing mass or choledocholithiasis. Correlate clinically and with liver function test to determine the need for further imaging with MRI/MRCP protocol. 2. Otherwise unremarkable exam.   ULTRASOUND HEPATIC ELASTOGRAPHY:   Median kPa:  3.8    Diagnostic category:  < or = 5 kPa: high probability of being normal    Assessment & Plan:   #hiv #hep b coinfection (e-ag positive) On symtuza  and tivicay  for concern hx off and on meds  12/28 discuss with him viral load rising 10/2022. He reports taking meds. Asked him to make sure he is actually taking symtuza  and tivicay  each day  01/16/23 December labs showed improved but still not controlled viral load. He reports good compliance missing one dose last 4 weeks of symtuza /tivicay . Will repeat lab today and if viral load still >200 will check resistance testing. Discussed with him coinfection with hep b that he really need to be on meds to prevent getting resistance to hepatitis b as well   Told me today 05/15/23 no missed dose last 4 weeks   11/13/23 id assessment Patient might not have filled his medication regularly; missing days here and there His hep b viral load has been high and we checked hep b genotype/resistance profile 05/2023 --> fortunately no resistance seen yet    05/2023 hepatitis b genotype No precore mutation or polymerase inhibitors resistance  11/2023 hiv genotype [id number]  01/2024 hep B genotype 3tc, ldt, adv, etv resistance not predicted Precore/bcp mutations not detected   6/24/25025 multiple id and pharmacy visits to help encourage compliance. Nothing obvious in terms of why he is not taking medication. Question of memory issue after stroke but he seems to remember fine. He kept saying he is taking his ART symtuza  but labs don't appear to suggest so. He is also supposed to be on tivicay /symtuza   Hcc screening 01/2024 negative  Discuss seriousness of need to take these medications   Lab Results  Component Value Date   HIV1RNAQUANT 36 (H) 09/30/2024   Lab Results  Component Value Date   CD4TCELL 16 (L) 06/24/2024   CD4TABS 146 (L) 06/24/2024   Hep b viral load: 09/2024    36k 07/06/24     263thousand 04/22/24     03/16/24     02/26/24        01/2024      683thousand  12/2023 hep b eAg reactive;  eAb nonreactive  08/19/24 patient compliant with his tivicay /symtuza ; no missed dose last 4 weeks. He has had monthly compliance visit with our pharmacist Cassie who is doing an excellent job getting him back on track. He has recently quit one job, which I think he'll benefit from as more time to pay attention to himself   09/30/24 appears to be compliant. Will see how his hiv and hep b viral load is responding  01/06/25 compliant no missed dose last 4 weeks. Have copay from [place] 25 dollars each month. Has been on about 6 months compliance with symtuza /tivicay . Hep b viral load trending down nicely but not yet < 2000. The rate of decrease doesn't suggest resistance. Will given 3-6 more months and if still not below 2000 will refer to GI/hepatology to see if other tx might need to be added, such as interferon   -discussed u=u -encourage compliance -hep b labs today -reviewed 08/2024 liver us  elastography; next us  end of this year for hcc screening -continue current HIV medication symtuza /tivicay  -labs today -f/u 3 months -will continue to hold pjp prophylaxis for now as his cd4 count is improving and he is taking ART regularly -patient wants to sign up for spap instead of gooddays -- will have him talk to donna and deanna     #need for dental care Several loose teeth Seeing dental here at [facility] -- last visit 11/2024  -f/u dental as needed   #skin rash Noted 01/06/25 but present per patient 3 months; bleeds at time  Exam suggest ddx of: Wart vs actinic lesion vs early squamous cell cancer  -Referral to dermatology -Establish pcp for exam/consideration of cryotherapy  #hx anal cancer stage 3 #perianal wart Treated 2018 by xrt/cxt No evidence disease of anal cancer as of 2021 f/u with colorectal surgery 10/16/22 perianal wart on exam. Discussed they could go away or get bigger. Tried previous imiquimod  as of  01/2023 05/2023 anal pap no high risk hpv  Using imiquimod  for his anal wart -- not worse  08/31/24 f/u dr Debby -- no concern and will f/u in a year    Other chronic medical problems #ckd Baseline cr 1.1-1.2; 08/2024 cr 0.94 #prostate cancer s/p chemoxrt    #hcm -- deferred today 01/06/25 -vaccinations Doesn't want any vaccine today -hepatitis Chronic hep b see above Hep c ab negative 08/2022 -std 01/2024 urine gc/chlam negative 11/2023 rpr reactive 2017 titer 1:1 but subsequently nonreactive (11/2023) Not sexually active defer this visit 09/30/24 -cancer screening Will discuss (colorectal and anal cancer) -pcp establishment Discuss he needs to arrange to get a primary care provider; he doesn't have one yet 08/2024 but for all practical purpose he'll need hiv/hep b care the most now and I can coordinate some other primary care issue for now to help improve compliance           Constance ONEIDA Passer, MD [facility] [phone]  page     01/06/2025, 11:08 AM   "

## 2025-01-07 ENCOUNTER — Other Ambulatory Visit: Payer: Self-pay

## 2025-01-07 ENCOUNTER — Other Ambulatory Visit (HOSPITAL_COMMUNITY): Payer: Self-pay

## 2025-01-07 LAB — COMPLETE METABOLIC PANEL WITHOUT GFR
AG Ratio: 1.3 (calc) (ref 1.0–2.5)
ALT: 14 U/L (ref 9–46)
AST: 14 U/L (ref 10–35)
Albumin: 3.9 g/dL (ref 3.6–5.1)
Alkaline phosphatase (APISO): 85 U/L (ref 35–144)
BUN: 17 mg/dL (ref 7–25)
CO2: 29 mmol/L (ref 20–32)
Calcium: 9.2 mg/dL (ref 8.6–10.3)
Chloride: 106 mmol/L (ref 98–110)
Creat: 1.28 mg/dL (ref 0.70–1.30)
Globulin: 2.9 g/dL (ref 1.9–3.7)
Glucose, Bld: 63 mg/dL — ABNORMAL LOW (ref 65–99)
Potassium: 4.3 mmol/L (ref 3.5–5.3)
Sodium: 140 mmol/L (ref 135–146)
Total Bilirubin: 0.3 mg/dL (ref 0.2–1.2)
Total Protein: 6.8 g/dL (ref 6.1–8.1)

## 2025-01-07 LAB — HEPATITIS B E ANTIBODY: Hep B E Ab: NONREACTIVE

## 2025-01-07 LAB — CBC WITH DIFFERENTIAL/PLATELET
Absolute Lymphocytes: 1255 {cells}/uL (ref 850–3900)
Absolute Monocytes: 464 {cells}/uL (ref 200–950)
Basophils Absolute: 31 {cells}/uL (ref 0–200)
Basophils Relative: 0.6 %
Eosinophils Absolute: 393 {cells}/uL (ref 15–500)
Eosinophils Relative: 7.7 %
HCT: 41 % (ref 39.4–51.1)
Hemoglobin: 13.6 g/dL (ref 13.2–17.1)
MCH: 30 pg (ref 27.0–33.0)
MCHC: 33.2 g/dL (ref 31.6–35.4)
MCV: 90.3 fL (ref 81.4–101.7)
MPV: 11.7 fL (ref 7.5–12.5)
Monocytes Relative: 9.1 %
Neutro Abs: 2958 {cells}/uL (ref 1500–7800)
Neutrophils Relative %: 58 %
Platelets: 231 10*3/uL (ref 140–400)
RBC: 4.54 Million/uL (ref 4.20–5.80)
RDW: 13.4 % (ref 11.0–15.0)
Total Lymphocyte: 24.6 %
WBC: 5.1 10*3/uL (ref 3.8–10.8)

## 2025-01-07 LAB — HEPATITIS B SURFACE ANTIGEN: Hepatitis B Surface Ag: REACTIVE — AB

## 2025-01-07 LAB — HEPATITIS B E ANTIGEN: Hep B E Ag: REACTIVE — AB

## 2025-04-05 ENCOUNTER — Ambulatory Visit: Payer: Self-pay | Admitting: Infectious Disease

## 2025-04-08 ENCOUNTER — Ambulatory Visit: Admitting: Nurse Practitioner
